# Patient Record
Sex: Female | Born: 1970 | Race: Black or African American | Hispanic: No | Marital: Single | Smoking: Never smoker
Health system: Southern US, Community
[De-identification: ages and names within clinical notes are randomized; demographics above are authoritative.]

## PROBLEM LIST (undated history)

## (undated) DIAGNOSIS — K219 Gastro-esophageal reflux disease without esophagitis: Secondary | ICD-10-CM

## (undated) DIAGNOSIS — Z5189 Encounter for other specified aftercare: Secondary | ICD-10-CM

## (undated) DIAGNOSIS — E042 Nontoxic multinodular goiter: Secondary | ICD-10-CM

## (undated) DIAGNOSIS — I10 Essential (primary) hypertension: Secondary | ICD-10-CM

## (undated) HISTORY — DX: Gastro-esophageal reflux disease without esophagitis: K21.9

## (undated) HISTORY — PX: ENDOMETRIAL ABLATION: SHX621

## (undated) HISTORY — DX: Encounter for other specified aftercare: Z51.89

## (undated) HISTORY — DX: Nontoxic multinodular goiter: E04.2

---

## 2000-11-11 ENCOUNTER — Other Ambulatory Visit: Admission: RE | Admit: 2000-11-11 | Discharge: 2000-11-11 | Payer: Self-pay | Admitting: Family Medicine

## 2001-08-24 ENCOUNTER — Other Ambulatory Visit: Admission: RE | Admit: 2001-08-24 | Discharge: 2001-08-24 | Payer: Self-pay | Admitting: Obstetrics and Gynecology

## 2001-08-26 ENCOUNTER — Ambulatory Visit (HOSPITAL_COMMUNITY): Admission: RE | Admit: 2001-08-26 | Discharge: 2001-08-26 | Payer: Self-pay | Admitting: Obstetrics and Gynecology

## 2004-05-29 ENCOUNTER — Encounter (INDEPENDENT_AMBULATORY_CARE_PROVIDER_SITE_OTHER): Payer: Self-pay | Admitting: Family Medicine

## 2004-06-18 ENCOUNTER — Other Ambulatory Visit: Admission: RE | Admit: 2004-06-18 | Discharge: 2004-06-18 | Payer: Self-pay | Admitting: Family Medicine

## 2004-10-16 ENCOUNTER — Encounter: Admission: RE | Admit: 2004-10-16 | Discharge: 2004-10-16 | Payer: Self-pay | Admitting: Family Medicine

## 2005-09-22 ENCOUNTER — Emergency Department (HOSPITAL_COMMUNITY): Admission: EM | Admit: 2005-09-22 | Discharge: 2005-09-22 | Payer: Self-pay | Admitting: *Deleted

## 2005-12-25 ENCOUNTER — Emergency Department (HOSPITAL_COMMUNITY): Admission: EM | Admit: 2005-12-25 | Discharge: 2005-12-25 | Payer: Self-pay | Admitting: Emergency Medicine

## 2005-12-30 ENCOUNTER — Ambulatory Visit: Payer: Self-pay | Admitting: *Deleted

## 2006-02-02 ENCOUNTER — Ambulatory Visit: Payer: Self-pay | Admitting: Internal Medicine

## 2006-07-01 ENCOUNTER — Emergency Department (HOSPITAL_COMMUNITY): Admission: EM | Admit: 2006-07-01 | Discharge: 2006-07-01 | Payer: Self-pay | Admitting: Emergency Medicine

## 2006-09-02 ENCOUNTER — Emergency Department (HOSPITAL_COMMUNITY): Admission: EM | Admit: 2006-09-02 | Discharge: 2006-09-02 | Payer: Self-pay | Admitting: Family Medicine

## 2007-01-13 ENCOUNTER — Encounter (INDEPENDENT_AMBULATORY_CARE_PROVIDER_SITE_OTHER): Payer: Self-pay | Admitting: *Deleted

## 2007-01-19 ENCOUNTER — Telehealth (INDEPENDENT_AMBULATORY_CARE_PROVIDER_SITE_OTHER): Payer: Self-pay | Admitting: *Deleted

## 2007-02-02 ENCOUNTER — Encounter (INDEPENDENT_AMBULATORY_CARE_PROVIDER_SITE_OTHER): Payer: Self-pay | Admitting: Family Medicine

## 2007-02-02 DIAGNOSIS — G44209 Tension-type headache, unspecified, not intractable: Secondary | ICD-10-CM

## 2007-02-02 DIAGNOSIS — I10 Essential (primary) hypertension: Secondary | ICD-10-CM | POA: Insufficient documentation

## 2007-03-12 ENCOUNTER — Encounter (INDEPENDENT_AMBULATORY_CARE_PROVIDER_SITE_OTHER): Payer: Self-pay | Admitting: *Deleted

## 2007-07-22 ENCOUNTER — Inpatient Hospital Stay (HOSPITAL_COMMUNITY): Admission: EM | Admit: 2007-07-22 | Discharge: 2007-07-26 | Payer: Self-pay | Admitting: Emergency Medicine

## 2007-12-24 ENCOUNTER — Encounter: Payer: Self-pay | Admitting: Emergency Medicine

## 2007-12-25 ENCOUNTER — Encounter (INDEPENDENT_AMBULATORY_CARE_PROVIDER_SITE_OTHER): Payer: Self-pay | Admitting: Obstetrics

## 2007-12-25 ENCOUNTER — Inpatient Hospital Stay (HOSPITAL_COMMUNITY): Admission: AD | Admit: 2007-12-25 | Discharge: 2007-12-26 | Payer: Self-pay | Admitting: Obstetrics

## 2007-12-26 ENCOUNTER — Encounter (INDEPENDENT_AMBULATORY_CARE_PROVIDER_SITE_OTHER): Payer: Self-pay | Admitting: Obstetrics

## 2008-07-26 ENCOUNTER — Encounter: Admission: RE | Admit: 2008-07-26 | Discharge: 2008-07-26 | Payer: Self-pay | Admitting: Family Medicine

## 2010-05-20 ENCOUNTER — Encounter: Payer: Self-pay | Admitting: Family Medicine

## 2010-07-09 ENCOUNTER — Inpatient Hospital Stay (HOSPITAL_COMMUNITY)
Admission: EM | Admit: 2010-07-09 | Discharge: 2010-07-11 | DRG: 305 | Disposition: A | Discharge status: Home / Self Care | Payer: Self-pay | Attending: Internal Medicine | Admitting: Internal Medicine

## 2010-07-09 ENCOUNTER — Emergency Department (HOSPITAL_COMMUNITY): Payer: Self-pay

## 2010-07-09 DIAGNOSIS — E876 Hypokalemia: Secondary | ICD-10-CM | POA: Diagnosis present

## 2010-07-09 DIAGNOSIS — I517 Cardiomegaly: Secondary | ICD-10-CM | POA: Diagnosis present

## 2010-07-09 DIAGNOSIS — R079 Chest pain, unspecified: Secondary | ICD-10-CM | POA: Diagnosis present

## 2010-07-09 DIAGNOSIS — Z91199 Patient's noncompliance with other medical treatment and regimen due to unspecified reason: Secondary | ICD-10-CM

## 2010-07-09 DIAGNOSIS — I519 Heart disease, unspecified: Secondary | ICD-10-CM | POA: Diagnosis present

## 2010-07-09 DIAGNOSIS — I10 Essential (primary) hypertension: Principal | ICD-10-CM | POA: Diagnosis present

## 2010-07-09 DIAGNOSIS — Z9119 Patient's noncompliance with other medical treatment and regimen: Secondary | ICD-10-CM

## 2010-07-09 LAB — CBC
HCT: 41.8 % (ref 36.0–46.0)
MCHC: 32.8 g/dL (ref 30.0–36.0)
Platelets: 202 10*3/uL (ref 150–400)
RBC: 4.49 MIL/uL (ref 3.87–5.11)
RDW: 13.5 % (ref 11.5–15.5)
WBC: 9.8 10*3/uL (ref 4.0–10.5)

## 2010-07-09 LAB — URINALYSIS, ROUTINE W REFLEX MICROSCOPIC
Bilirubin Urine: NEGATIVE
Glucose, UA: NEGATIVE mg/dL
Hgb urine dipstick: NEGATIVE
Ketones, ur: NEGATIVE mg/dL
Nitrite: NEGATIVE
Protein, ur: NEGATIVE mg/dL
Specific Gravity, Urine: 1.018 (ref 1.005–1.030)
Urobilinogen, UA: 0.2 mg/dL (ref 0.0–1.0)
pH: 7 (ref 5.0–8.0)

## 2010-07-09 LAB — POCT CARDIAC MARKERS
CKMB, poc: 1.4 ng/mL (ref 1.0–8.0)
Myoglobin, poc: 59.6 ng/mL (ref 12–200)

## 2010-07-09 LAB — POCT I-STAT, CHEM 8
Calcium, Ion: 1.16 mmol/L (ref 1.12–1.32)
Glucose, Bld: 96 mg/dL (ref 70–99)

## 2010-07-09 LAB — RAPID STREP SCREEN (MED CTR MEBANE ONLY): Streptococcus, Group A Screen (Direct): NEGATIVE

## 2010-07-09 LAB — DIFFERENTIAL
Basophils Absolute: 0 10*3/uL (ref 0.0–0.1)
Eosinophils Relative: 1 % (ref 0–5)
Lymphs Abs: 1.7 10*3/uL (ref 0.7–4.0)
Neutro Abs: 7.1 10*3/uL (ref 1.7–7.7)

## 2010-07-10 DIAGNOSIS — I10 Essential (primary) hypertension: Secondary | ICD-10-CM

## 2010-07-10 LAB — CARDIAC PANEL(CRET KIN+CKTOT+MB+TROPI)
CK, MB: 1 ng/mL (ref 0.3–4.0)
CK, MB: 1.1 ng/mL (ref 0.3–4.0)
CK, MB: 0.9 ng/mL (ref 0.3–4.0)
Relative Index: 0.5 (ref 0.0–2.5)
Relative Index: 0.6 (ref 0.0–2.5)
Total CK: 166 U/L (ref 7–177)
Troponin I: 0.01 ng/mL (ref 0.00–0.06)
Troponin I: 0.01 ng/mL (ref 0.00–0.06)

## 2010-07-10 LAB — COMPREHENSIVE METABOLIC PANEL
AST: 24 U/L (ref 0–37)
Creatinine, Ser: 0.81 mg/dL (ref 0.4–1.2)
Glucose, Bld: 114 mg/dL — ABNORMAL HIGH (ref 70–99)
Potassium: 3.2 mEq/L — ABNORMAL LOW (ref 3.5–5.1)
Sodium: 139 mEq/L (ref 135–145)
Total Bilirubin: 1.3 mg/dL — ABNORMAL HIGH (ref 0.3–1.2)

## 2010-07-10 LAB — CBC
HCT: 38.5 % (ref 36.0–46.0)
Hemoglobin: 12.5 g/dL (ref 12.0–15.0)
MCH: 30.3 pg (ref 26.0–34.0)
MCHC: 32.5 g/dL (ref 30.0–36.0)
MCV: 93.2 fL (ref 78.0–100.0)
RBC: 4.13 MIL/uL (ref 3.87–5.11)
RDW: 13.5 % (ref 11.5–15.5)

## 2010-07-10 NOTE — H&P (Signed)
[name], [name] NO.:  [id number]  MEDICAL RECORD NO.:  [id number]           PATIENT TYPE:  E  LOCATION:  WLED                         FACILITY:  [facility]  PHYSICIAN:  Vania Rea, M.D. DATE OF BIRTH:  [birth year]  DATE OF ADMISSION:  07/09/2010 DATE OF DISCHARGE:                             HISTORY & PHYSICAL   PRIMARY CARE PHYSICIAN:  Unassigned, previously was seeing Dr. Renaye Rakers.  CHIEF COMPLAINT:  Episodic chest pain and hoarseness.  HISTORY OF PRESENT ILLNESS:  This is a 40 year old African American lady with a long-standing history of hypertension for over twenty years, and a strong family history of hypertension including her 41 year old son, who presents to the emergency room complaining of on and off chest pain for a long period of time, but now has been having hoarseness and cough.  In the emergency room, the patient was evaluated and felt to be having a mild upper respiratory infection causing hoarseness and cough and probable chest pain, but was additionally found to be having a blood pressure of 244/154.  She admits that she has not used her blood pressure medications for the past 2 days, but also admits that when she has checked her blood pressure over the past weeks, she does tend to run systolics in the 190s to 200s.  She has not been seeing a physician regularly because she moved out of the area and recently moved back. She has been having no headache, no palpitations, no shortness of breath nor PND, no lower extremity edema, no diaphoresis nor dizziness, no nausea, no vomiting.  PAST MEDICAL HISTORY: 1. Hypertension. 2. Menorrhagia due to fibroids. 3. She is status post endometrial ablation. 4. She is status post tubal ligation.  MEDICATIONS: 1. Amlodipine 10 mg daily. 2. Lisinopril/HCTZ 20/25 one tablet daily. 3. No medications taken in the past 2 days.  ALLERGIES:  No known drug allergies.  SOCIAL HISTORY:  Denies  tobacco, alcohol, or illicit drug use.  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  She lives with her 4 children.  FAMILY HISTORY:  Significant for hypertension in her mother, brothers, and sisters.  She also has 2 sisters with diabetes and her son who is now 45 was diagnosed with hypertension at the age of 10.  REVIEW OF SYSTEMS:  Other than noted above, a complete review of systems is unremarkable.  PHYSICAL EXAMINATION:  GENERAL:  An obese middle-aged African American lady reclining in the stretcher, not currently in any acute distress. VITAL SIGNS:  Initially as noted above, after receiving 10 mg of beta- blocker intravenously, her blood pressure is 173/100.  She is saturating at 98% on room air, temperature is 98.4, her pulse is 86, respirations 18. HEENT:  Her pupils are round and equal.  Mucous membranes pink. Anicteric.  Her throat is normal.  There is no hyperemia.  There is no congestion. NECK:  No cervical lymphadenopathy or thyromegaly.  No carotid bruit. No jugular venous distention.CHEST:  Clear to auscultation bilaterally. CARDIOVASCULAR SYSTEM:  Regular rhythm.  No murmur heard. ABDOMEN:  Obese, soft, nontender.  No masses.  EXTREMITIES:  Muscle tone and bulk is good.  No edema.  Dorsalis pedis pulses 2+ bilaterally. CENTRAL NERVOUS SYSTEM:  Cranial nerves II through XII are grossly intact.  She has no focal lateralizing signs.  LABORATORY DATA:  Her white count is 9.8, hemoglobin 13.7, platelets 202.  She has a normal differential.  Her i-STAT chemistry shows a sodium of 139, potassium of 3.8, chloride of 100, glucose 96, BUN 11, creatinine 1.0.  Her cardiac markers are completely normal with a myoglobin of 59 and undetectable troponins.  Urinalysis is completely normal.  Rapid strep throat is negative.  Her EKG shows sinus tachycardia, possible left atrial enlargement, left ventricular hypertrophy as evidenced by R wave in  aVL being 16 mm.  Two- view chest x-ray shows normal-sized heart, clear lungs.  No infiltrate or effusion.  ASSESSMENT: 1. Hypertensive urgency. 2. Possible upper respiratory infection. 3. Obesity  PLAN: 1. We will bring this lady in on observation for management of the     hypertensive urgency.  We will continue her home medications, but     based on her description of the lack of control, we will increase     the lisinopril to 40 mg daily and add Coreg 6.25 mg twice daily.     We will add p.r.n. hydralazine while in hospital. 2. We will order a 2-D echo. 3. Other plans as per orders.     Vania Rea, M.D.     LC/MEDQ  D:  07/09/2010  T:  07/10/2010  Job:  [id number]  cc:   Renaye Rakers, M.D. Fax: [phone]  Electronically Signed by Vania Rea M.D. on 07/10/2010 03:49:15 AM

## 2010-07-11 LAB — BASIC METABOLIC PANEL
BUN: 9 mg/dL (ref 6–23)
Calcium: 9.2 mg/dL (ref 8.4–10.5)
Chloride: 102 mEq/L (ref 96–112)
Creatinine, Ser: 0.8 mg/dL (ref 0.4–1.2)
GFR calc Af Amer: >60 mL/min (ref >60)
GFR calc non Af Amer: >60 mL/min (ref >60)
Glucose, Bld: 98 mg/dL (ref 70–99)
Potassium: 3.9 mEq/L (ref 3.5–5.1)
Sodium: 135 mEq/L (ref 135–145)

## 2010-07-16 NOTE — Discharge Summary (Signed)
[name], [name]                ACCOUNT NO.:  [id number]  MEDICAL RECORD NO.:  [id number]           PATIENT TYPE:  I  LOCATION:  1414                         FACILITY:  [facility]  PHYSICIAN:  Hillery Aldo, M.D.   DATE OF BIRTH:  [birth year]  DATE OF ADMISSION:  07/09/2010 DATE OF DISCHARGE:  07/11/2010                              DISCHARGE SUMMARY   PRIMARY CARE PHYSICIAN:  Currently none.  The patient has seen [name] and has gone to [facility] in the past.  DISCHARGE DIAGNOSES: 1. Accelerated hypertension. 2. Medical nonadherence. 3. Hypokalemia. 4. Chest pain likely secondary to accelerated hypertension, status     post two-dimensional echocardiogram negative for regional wall     motion abnormalities. 5. Grade 1 diastolic dysfunction. 6. Left ventricular hypertrophy.  DISCHARGE MEDICATIONS: 1. Carvedilol 6.25 mg p.o. b.i.d. 2. Diltiazem 90 mg p.o. b.i.d. 3. Lisinopril/HCTZ 40/25 p.o. daily. 4. Aleve 220 mg three tablets p.o. b.i.d. p.r.n. pain. 5. Allergy Eye Relief one to three drops OU b.i.d. p.r.n. dry eye. 6. Aspirin 81 mg p.o. daily. 7. Extra Strength Tylenol 500 mg one to two tablets p.o. q.6 hours     p.r.n. pain.  CONSULTATIONS:  None.  BRIEF ADMISSION HISTORY OF PRESENT ILLNESS:  The patient is a 40 year old female with past medical history of hypertension.  She presented to the hospital with a chief complaint of episodic chest pain and feeling hoarse.  Upon initial evaluation in the Emergency Department, she was found to have a blood pressure of 244/154.  The patient reports that she had not taken her antihypertensive medication for at least 2 days but even when on her medicines, she reported systolics in the 190s to 200s. The patient was subsequently referred to the Hospitalist Service for further inpatient evaluation and treatment.  For the full details, please see the dictated report done by Dr. Orvan Falconer.  PROCEDURES AND DIAGNOSTIC STUDIES: 1.  Chest x-ray on Blair 13, 2012, was negative. 2. Two-dimensional echocardiogram done on Blair 14, 2012, showed LVH.     Systolic function was normal with an ejection fraction estimated at     50% to 55%.  There were no regional wall motion abnormalities.     There was grade 1 diastolic dysfunction.  DISCHARGE LABORATORY VALUES:  Sodium was 135, potassium 3.9, chloride 102, bicarbonate 28, BUN 9, creatinine 0.80, glucose 98, calcium 9.2. Cardiac markers were negative x3 sets.  TSH was 0.731.  Magnesium was 2 and liver function studies were within normal limits with the exception of mildly elevated bilirubin at 1.3 and a mildly low albumin at 3.4. Rapid streptococcus screening was negative.  HOSPITAL COURSE BY PROBLEM: 1. Accelerated hypertension:  The patient was admitted and put back on     her home medications.  Her lisinopril was titrated up from 20 to 40     mg and Coreg was added.  She was given p.r.n. hydralazine while in     the hospital and ultimately achieved good blood pressure control on     the four-drug regimen as outlined above.  We suspect that her lack  of control was secondary to medical nonadherence and non-followup.     At this point, would refer her back to either [facility] or to the     [facility] for ongoing hypertension management and for     consideration of a workup of secondary causes of hypertension.  The     patient has been fully educated about the risks of untreated     hypertension and verbalized understanding. 2. Hypokalemia:  The patient's potassium was fully repleted prior to     discharge. 3. Chest pain:  This was felt to be due to hypertensive urgency.  Her     cardiac markers were negative x3 sets and there was no evidence of     regional wall motion abnormality on two-dimensional     echocardiography. 4. Grade 1 diastolic dysfunction:  The patient achieved blood pressure     control prior to discharge and was encouraged to follow up with      [facility] or the [facility] for ongoing blood pressure     monitoring and adjustment of medications as needed.  All of her     medications were changed to generics that are available at [employer]     for 4 dollars a month secondary to cost concerns.  DISPOSITION:  The patient is medically stable and will be discharged home.  CONDITION ON DISCHARGE:  Improved.  DISCHARGE DIET:  Heart-healthy, low-sodium.  Time spent coordinating care for discharge and discharge instructions including face-to-face time equals 35 minutes.     Hillery Aldo, M.D.     CR/MEDQ  D:  07/11/2010  T:  07/11/2010  Job:  [id number]  cc:   [name], M.D. Fax: [phone]  [name], M.D. Fax: [phone]  Electronically Signed by Hillery Aldo M.D. on 07/16/2010 03:24:38 PM

## 2010-09-10 NOTE — Op Note (Signed)
NAMECALLE, SCHADER NO.:  [id number]   MEDICAL RECORD NO.:  [id number]          PATIENT TYPE:  INP   LOCATION:  9304                          FACILITY:  [facility]   PHYSICIAN:  Kathreen Cosier, M.D.DATE OF BIRTH:  1971-04-20   DATE OF PROCEDURE:  12/26/2007  DATE OF DISCHARGE:  12/26/2007                               OPERATIVE REPORT   PREOPERATIVE DIAGNOSES:  1. Dysfunctional uterine bleeding.  2. Myoma uteri.  3. Severe anemia requiring transfusion.   POSTOPERATIVE DIAGNOSES:  1. Dysfunctional uterine bleeding.  2. Myoma uteri.  3. Severe anemia requiring transfusion.   PROCEDURES:  1. Hysteroscopy.  2. Dilation and curettage.  3. NovaSure ablation.   SURGEON:  Kathreen Cosier, MD   Under general anesthesia, the patient in lithotomy position.  Perineum  and vagina prepped and draped.  Bladder emptied with straight catheter.  Bimanual exam revealed uterus to be 12-14 weeks' size.  Speculum placed  in the vagina.  Cervix was injected with 10 mL 1% Xylocaine.  Anterior  lip of the cervix grasped with the tenaculum.  Endocervix curetted,  small amount of tissue obtained.  The endometrial cavity sounded 11 cm.  The cervix then measured 6 cm, cavity 5 cm.  The cervix was noted to be  open and easily admitted a #27 Pratt.  The hysteroscope inserted and  revealed polyp.  The hysteroscope removed.  Sharp curettage performed.  Large amount of tissue obtained.  NovaSure ablation device was inserted  and the cavity integrity test passed.  NovaSure ablation was performed  for 2 minutes at 124 W.  Cavity width was 4.5.  After the ablation, the  NovaSure device removed and hysteroscopy revealed total ablation of the  cavity.  Fluid deficit 45 mL.  The patient tolerated the procedure well  and was taken to recovery room in good condition.           ______________________________  Kathreen Cosier, M.D.     BAM/MEDQ  D:  12/26/2007  T:  12/26/2007  Job:   [id number]

## 2010-09-10 NOTE — Discharge Summary (Signed)
[name], [name] NO.:  [id number]   MEDICAL RECORD NO.:  [id number]          PATIENT TYPE:  INP   LOCATION:  1520                         FACILITY:  [facility]   PHYSICIAN:  Lucita Ferrara, MD         DATE OF BIRTH:  May 24, 1970   DATE OF ADMISSION:  07/22/2007  DATE OF DISCHARGE:  07/24/2007                               DISCHARGE SUMMARY   DISCHARGE DIAGNOSES:  1. Menorrhagia.  2. Acute blood loss anemia secondary to #1.  3. Chest pain, dizziness and chest pressure.  4. History of hypertension, uncontrolled.  5. Hypokalemia, resolved.   PROCEDURES:  1. The patient had an abdominal ultrasound on July 23, 2007, which      showed negative abdominal ultrasound, normal gallbladder; no      hepatic, pancreatic, splenic or renal abnormalities.  2. The patient had a pelvic ultrasound which showed focal fibroids,      three areas of focality with altered echo texture compatible with      focal fibroids, number one a mural with a small subserosal      component in the fundal region measuring 3 x 2.7 x 4.1, in the      posterior upper uterine segment measuring 2 x 2.2 x 1.8, and in      mural location in the right lateral mid body measuring 1.8 x 2.4 x      1.8 cm mural location.  Yet, no fibroid with submucosal component,      normal presecretory endometrial stripe, normal ovaries.      Transvaginal ultrasound as above.  3. The patient also had a chest x-ray dated July 22, 2007, which      showed normal heart size and vascularity, lungs clear; negative for      pneumonia, edema, effusion or pneumothorax.  Midline trachea.   CONSULTATIONS:  Dr. Leda Quail with obstetrics and gynecology  services at [facility].   BRIEF HISTORY OF PRESENT ILLNESS:  [name] is a 40 year old African  American female who presents to [facility] with chief  complaint of heavy urine bleeding that has been ongoing for the last  several days.  She also felt  some chest pain and dizziness.  Given the  above findings hospital course below.   1. Menorrhagia.  The patient was evaluated by GYN, Dr. Hyacinth Meeker, who      recommended a uterine, abdominal and transvaginal ultrasound which      confirmed our suspicion of having fibroids which is basically the      culprit for her significant anemia and vaginal bleeding.  She was      transfused several units of packed red blood cells and her      hemoglobin stabilized.  Note that she did not have any hematemesis      or hematochezia.  She also did not have any rectal pain.  She also      did not have any focal abdominal pain.  Her dizziness, chest pain      also resolved upon institution of above  and transfusion of blood.      She was also started on ferrous sulfate given her microcytosis.  2. Hypertension.  It was noted that the patient actually has a known      diagnosis of uncontrolled hypertension.  At the time of admission      she had not been taking her blood pressure medications.  She denied      any visual changes.  Her blood pressure was difficult to stabilize      but upon institution of hydrochlorothiazide 25 mg daily her blood      pressure normalized.   Discharge medications will be as follows:  1. She will be on ferrous sulfate 325 mg p.o. b.i.d.  2. Colace 100 mg p.o. b.i.d.  3. She will be going home with hydrochlorothiazide 25 mg p.o. daily.  4. Tylenol Extra Strength 1000 mg every 4-6 hours as needed.   DISCHARGE CONDITION:  Stable.   DISCHARGE DIET:  She is to resume a regular diet.   ACTIVITY:  She is to resume normal activity.  She is to increase her  activity slowly, to call with any chest pain, dizziness or shortness of  breath.   FOLLOW-UP APPOINTMENTS:  She is to follow up with Dr. Mia Creek at the  [facility].  Appointment will be made at the time of  discharge.  We will also help her set up a primary care visit with  [facility] for her primary care needs,  hypertension control.  I  recommended the patient that if primary care doctor cannot control blood  pressure she will eventually need a referral to nephrology for possible  evaluation of secondary causes of hypertension.  Upon discharge she is  hemodynamically stable.  Vital signs are within normal limits.  Blood  pressure has been stabilized now to 130/80, temperature 97.8, pulse 69,  respirations 16, pulse oximetry is 98% on room air.      Lucita Ferrara, MD  Electronically Signed     RR/MEDQ  D:  07/24/2007  T:  07/24/2007  Job:  [id number]

## 2010-09-10 NOTE — H&P (Signed)
NAMEJOSSIE, SMOOT                ACCOUNT NO.:  [id number]   MEDICAL RECORD NO.:  [id number]          PATIENT TYPE:  INP   LOCATION:  0102                         FACILITY:  [facility]   PHYSICIAN:  Darryl D. Prime, MD    DATE OF BIRTH:  1970/10/11   DATE OF ADMISSION:  07/22/2007  DATE OF DISCHARGE:                              HISTORY & PHYSICAL   The patient is full code.   PRIMARY CARE PHYSICIAN:  Renaye Rakers, M.D., but she has not seen in  quite some time, per patient.   Patient was the historian.  She is a good historian.   CHIEF COMPLAINT:  Chest pain and drowsy.   HISTORY OF PRESENT ILLNESS:  Ms. Laham is a 40 year old female with a  history of anemia, possibly due to heavy uterine bleeding and  menorrhagia, who notes for the last two weeks, she has been  significantly drowsy and having constant substernal chest pain.  Substernal chest pain is mild, but radiating to the right side, sharp.  The patient notes her blood pressure has been high, as well, and she has  not been taking her medicine since December.  The patient denies taking  any NSAIDs of any kind.  The patient notes her last menstrual period was  around July 06, 2007, and she had particularly heavy bleeding, to where  she had to change her clothes at least once, described as about eight  pads with clots soaked per day for five days.  She usually soaks six  pads per day for around five days.  The patient, in the emergency room,  was found to have a hemoglobin of 6.9 and was typed and crossed for two  units of blood.  She was Hemoccult-negative in the ER.  She denies any  black or bloody stools.  She denies any dysuria or hematuria.  She  denies any fevers.   PAST MEDICAL/SURGICAL HISTORY:  Bilateral tubal ligation.  She has a  history of prior therapy of the cervix times two.  History of abnormal  uterine bleeding.  History of hypertension.   ALLERGIES:  No known drug allergies.   MEDICATIONS:  She is not  taking, but she is supposed to be on Tiazac 120  mg daily and hydrochlorothiazide, unsure of dose.   FAMILY HISTORY:  Positive for diabetes and hypertension in both the  mother and the father.  She has a son, age 56, with hypertension.   SOCIAL HISTORY:  No tobacco ever.  She does occasionally drink alcohol  and she works for a [employer].   REVIEW OF SYSTEMS:  Fourteen-point review of systems negative, unless  stated above.   PHYSICAL EXAM:  VITAL SIGNS:  Temperature is 98 with a blood pressure of  182/100, pulse of 96, respiratory rate of 14, sats are 100% on room air,  weight is 82 kg.  IN GENERAL:  She is a female that looks her stated age, in no acute  distress.  HEENT:  Normocephalic, atraumatic.  Pupils were equal, round and  reactive to light.  Extraocular movements intact.  The conjunctiva  is  pale, sclera is anicteric.  The oropharynx is dry with no posterior  pharyngeal lesions.  NECK:  Supple with no lymphadenopathy or thyromegaly.  CARDIOVASCULAR EXAM:  Regular rhythm and rate with no murmurs, rubs or  gallops.  Normal S1, S2, no S3 or S4.  LUNGS:  Clear to auscultation bilaterally with no wheezing.  ABDOMEN:  Soft, nontender, nondistended, with no signs of  hepatosplenomegaly.  EXTREMITIES:  Show no clubbing or cyanosis.  There is trace lower  extremity edema bilaterally.  NEUROLOGIC EXAM:  She is alert and oriented times four with cranial  nerves II through XII grossly intact.  Strength and sensation are  grossly intact.   Chest x-ray shows no acute cardiopulmonary disease.  No prior study.   EKG showed normal sinus rhythm with sinus arrhythmia at a rate of 88, PR  interval 174, QRS 96, QT correct at 450 with normal axis, normal  intervals with normal ST to T signs.  No prior EKG available.   Cardiac markers are 22, 37, point of care were unremarkable, as well as  21, 27, also unremarkable.  Sodium of 139 with a potassium of 3.3,  chloride 107,  bicarb 25, BUN 7, creatinine 0.67, glucose 95, otherwise  normal LFTs.  Hemoglobin was 6.9 with a hematocrit of 23.3, last  hemoglobin in April of 2003 was 10.7.  Platelets are 158.  HCG urine was  negative.  Her white blood cell count was 7.4.  PT was 14.1, PTT 31, INR  1.1.  urinalysis negative.   ASSESSMENT AND PLAN:  This is a patient with a history of menorrhagia,  who presents with low hemoglobin, possibly having continued menorrhagia.  The patient will be admitted to receive three units of packed red blood  cells and we will check a TSH for her hypertension.  We will give Lasix  after the blood to try to control the blood pressure and she will most  likely go home on antihypertensives.  For her hypokalemia, we will  replete and follow.  For her chest pain, likely related to her low  hemoglobin, we will get cardiac markers times three and EKG in the  morning.  Considering her being low risk, she will likely not need a  telemetry bed.  GI prophylaxis with Zantac; DVT prophylaxis with  pneumatic compression devices.      Darryl D. Prime, MD  Electronically Signed     DDP/MEDQ  D:  07/22/2007  T:  07/23/2007  Job:  [id number]

## 2010-09-13 NOTE — Op Note (Signed)
[facility]  Patient:    [name], [name] Visit Number: [id number] MRN: [id number]          Service Type: DSU Location: [facility] Attending Physician:  Jaymes Graff A Dictated by:   Pierre Bali Normand Sloop, M.D. Proc. Date: 08/26/01 Admit Date:  08/26/2001                             Operative Report  DATE OF BIRTH:                02-07-1971  PREOPERATIVE DIAGNOSES:       Multiparity, desires sterilization.  POSTOPERATIVE DIAGNOSES:      Multiparity, desires sterilization.  PROCEDURE:                    Laparoscopic bilateral tubal ligation.  SURGEON:                      Naima A. Normand Sloop, M.D.  ANESTHESIA:                   General endotracheal tube, ECC 0.25% Marcaine.  INTRAVENOUS FLUIDS:           1800 cc Crystalloid.  ESTIMATED BLOOD LOSS:         Minimal.  URINE OUTPUT:                 About 25 cc.  COMPLICATIONS:                None.  FINDINGS:                     Normal sized uterus.  Normal appearing uterus, tubes, and ovaries.  Anterior and posterior cul-de-sacs were within normal limits.  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  Patient had normal appendix, however.  PROCEDURE IN DETAIL:          Patient was taken to the operating room where she was placed in the dorsal lithotomy position, given general anesthesia, prepped and draped in the normal sterile fashion.  Attention was turned to the vagina where a bivalve speculum was placed into the vagina.  Anterior lip of the cervix was grasped with a single tooth tenaculum.  Acorn manipulator was placed into the uterus as a means to manipulate the uterus.  Attention was then turned to the patients abdomen where a horizontal 10 mm infraumbilical incision was then made with a knife after 5 cc of 0.25% Marcaine was injected into the  skin.  The Veress needle was placed into the intra-abdominal cavity at a 45 degree angle while tenting the abdominal wall.  Intra-abdominal placement was confirmed with fluid filled syringe, a decrease in CO2 pressure. The abdomen was insufflated with about 3 L CO2 gas.  The Veress needle was then removed.  A 10 mm trocar was placed without difficulty.  Intra-abdominal placement was confirmed with the laparoscope.  The findings noted above were seen.  A small 5 mm incision was made 2 cm above the symphysis pubis.  After 3 cc 0.25% Marcaine was injected a 5 mm trocar was placed under direct visualization.  Hemostasis was assured.  The patients fallopian tubes were identified and followed out to the fimbriated end.  The patients right  fallopian tube was identified, grasped with Kleppinger forceps, fulgurated x3 in the mid isthmic portion of the tube.  Hemostasis was noted.  The patients left fallopian tube was identified, followed out to the fimbriated end in a similar fashion.  The mid isthmic portion of the tube was grasped with Kleppinger and fulgurated x3.  Hemostasis was noted.  The 5 mm trocar was then removed without difficulty.  Hemostasis was noted.  The 10 mm trocar and the scope was removed under direct visualization.  The fascia was closed with 0 Vicryl in an interlocked stitch in the infraumbilical incision.  The skin was closed in a subcuticular fashion using 3-0 Vicryl.  Sponge, lap, and needle counts were correct x2.  The tenaculum and acorn were removed with cervix noted to be hemostatic from the tenaculum. Dictated by:   Pierre Bali. Normand Sloop, M.D. Attending Physician:  Michael Litter DD:  08/26/01 TD:  08/27/01 Job: [id number]

## 2010-09-13 NOTE — H&P (Signed)
[facility]  Patient:    [name], [name] Visit Number: [id number] MRN: [id number]          Service Type: DSU Location: [facility] Attending Physician:  Jaymes Graff A Dictated by:   Pierre Bali Normand Sloop, M.D. Admit Date:  08/26/2001                           History and Physical  DATE OF BIRTH:                08-26-1970  HISTORY OF PRESENT ILLNESS:   The patient is a 40 year old African-American female, G5, P4-0-1-4 who presented to my office for a preoperative consultation on August 24, 2001 for a tubal ligation. The patients had ______ IUD but she was unsatisfied with the side effects and because she has hypertension did not want to take birth control pills or use any combined contraceptive. The patient was also given the option of having Depo-Provera or barrier contraception but said she desired sterilization.  PAST MEDICAL HISTORY:         Significant for hypertension.  PAST SURGICAL HISTORY:        Significant for cryosurgery of the cervix x2.  MEDICATIONS:                  Procardia XL and hydrochlorothiazide.  ALLERGIES:                    The patient has no known drug allergies.  PAST GYN HISTORY:             The patient is monogamous with one partner. No history of sexually transmitted diseases. She does have a history of abnormal Pap smear and which she received cryotherapy. She did have a history of Trichomonas treated years ago.  SOCIAL HISTORY:               Unremarkable.  FAMILY HISTORY:               Significant for diabetes and hypertension but no GYN cancer.  PHYSICAL EXAMINATION:  VITAL SIGNS:                  The patients blood pressure is 120/80, her weight is 185 pounds.  HEART:                        Regular.  LUNGS:                        Clear.  ABDOMEN:                      Soft and nontender.  PELVIC:                       Vulvovaginal exam is within normal limits. Vagina is normal. Cervix is nontender. Uterus  is normal size, shape, and consistency. Adnexa has no masses and nontender.  IMPRESSION/PLAN:              The patient has multiparity and desires tubal sterilization. The risks of sterilization were explained to the patient that they are but not limited to a failure rate of about 1 in 250, bleeding, infection, and damage to internal organs such as bowel, bladder and ureters. The patient was given all other contraceptive options. The patient did have Pap smear significant  for a low grade squamous intraepithelial lesion; however, her biopsy was found to be negative so she is to be followed by Pap smears every six months. Dictated by:   Pierre Bali. Normand Sloop, M.D. Attending Physician:  Michael Litter DD:  08/26/01 TD:  08/26/01 Job: [id number]

## 2010-11-11 ENCOUNTER — Other Ambulatory Visit (HOSPITAL_COMMUNITY): Payer: Self-pay | Admitting: Family Medicine

## 2010-11-11 DIAGNOSIS — R1011 Right upper quadrant pain: Secondary | ICD-10-CM

## 2010-11-13 ENCOUNTER — Ambulatory Visit (HOSPITAL_COMMUNITY): Payer: Self-pay

## 2010-11-20 ENCOUNTER — Ambulatory Visit (HOSPITAL_COMMUNITY)
Admission: RE | Admit: 2010-11-20 | Discharge: 2010-11-20 | Disposition: A | Discharge status: Home / Self Care | Payer: Self-pay | Source: Ambulatory Visit | Attending: Family Medicine | Admitting: Family Medicine

## 2010-11-20 ENCOUNTER — Ambulatory Visit (HOSPITAL_COMMUNITY): Payer: Self-pay

## 2010-11-20 DIAGNOSIS — R1011 Right upper quadrant pain: Secondary | ICD-10-CM | POA: Insufficient documentation

## 2010-11-20 DIAGNOSIS — R7989 Other specified abnormal findings of blood chemistry: Secondary | ICD-10-CM | POA: Insufficient documentation

## 2011-01-20 LAB — CBC
HCT: 23.3 — ABNORMAL LOW
HCT: 33.5 — ABNORMAL LOW
Hemoglobin: 10.9 — ABNORMAL LOW
Hemoglobin: 11.4 — ABNORMAL LOW
Hemoglobin: 6.9 — CL
MCHC: 29.6 — ABNORMAL LOW
MCHC: 31.7
MCHC: 31.9
MCHC: 32.5
MCV: 59.5 — ABNORMAL LOW
MCV: 66.4 — ABNORMAL LOW
Platelets: 158
Platelets: 92 — ABNORMAL LOW
RBC: 5.4 — ABNORMAL HIGH
RDW: 23 — ABNORMAL HIGH
RDW: 26.2 — ABNORMAL HIGH
RDW: 28 — ABNORMAL HIGH
WBC: 10.7 — ABNORMAL HIGH

## 2011-01-20 LAB — COMPREHENSIVE METABOLIC PANEL
ALT: 15
ALT: 17
AST: 17
Albumin: 3.3 — ABNORMAL LOW
Alkaline Phosphatase: 48
Alkaline Phosphatase: 56
Alkaline Phosphatase: 63
BUN: 4 — ABNORMAL LOW
BUN: 7
CO2: 29
Calcium: 9.2
Calcium: 9.2
Calcium: 9.2
Calcium: 9.4
Chloride: 102
Creatinine, Ser: 0.67
Creatinine, Ser: 0.79
GFR calc Af Amer: >60
GFR calc non Af Amer: >60
Glucose, Bld: 116 — ABNORMAL HIGH
Glucose, Bld: 86
Glucose, Bld: 95
Potassium: 3.3 — ABNORMAL LOW
Potassium: 4.3
Sodium: 136
Sodium: 138
Sodium: 141
Total Bilirubin: 1.6 — ABNORMAL HIGH
Total Protein: 6.3
Total Protein: 7.1
Total Protein: 7.2

## 2011-01-20 LAB — URINALYSIS, ROUTINE W REFLEX MICROSCOPIC
Bilirubin Urine: NEGATIVE
Glucose, UA: NEGATIVE
Hgb urine dipstick: NEGATIVE
Ketones, ur: NEGATIVE
Protein, ur: NEGATIVE
pH: 6.5

## 2011-01-20 LAB — CULTURE, BLOOD (ROUTINE X 2)

## 2011-01-20 LAB — POCT CARDIAC MARKERS
CKMB, poc: <1 — ABNORMAL LOW
Myoglobin, poc: 16.6
Operator id: 1192
Troponin i, poc: <0.05

## 2011-01-20 LAB — DIFFERENTIAL
Eosinophils Relative: 1
Lymphs Abs: 1.9
Monocytes Relative: 10
Neutro Abs: 4.6

## 2011-01-20 LAB — HEMOGLOBIN A1C
Hgb A1c MFr Bld: 5.8
Mean Plasma Glucose: 129

## 2011-01-20 LAB — APTT: aPTT: 31

## 2011-01-20 LAB — CARDIAC PANEL(CRET KIN+CKTOT+MB+TROPI)
CK, MB: 0.7
CK, MB: 0.7
Relative Index: 0.6

## 2011-01-20 LAB — CROSSMATCH

## 2011-01-20 LAB — TSH: TSH: 1.184

## 2011-01-20 LAB — FOLLICLE STIMULATING HORMONE: FSH: 3.7

## 2011-01-20 LAB — PHOSPHORUS
Phosphorus: 3.8
Phosphorus: 4.6

## 2011-01-20 LAB — MAGNESIUM: Magnesium: 2

## 2011-03-14 ENCOUNTER — Other Ambulatory Visit: Payer: Self-pay | Admitting: Family Medicine

## 2011-03-14 DIAGNOSIS — Z1231 Encounter for screening mammogram for malignant neoplasm of breast: Secondary | ICD-10-CM

## 2011-07-24 ENCOUNTER — Other Ambulatory Visit: Payer: Self-pay | Admitting: Internal Medicine

## 2011-11-05 ENCOUNTER — Encounter (HOSPITAL_COMMUNITY): Payer: Self-pay | Admitting: *Deleted

## 2011-11-05 ENCOUNTER — Emergency Department (HOSPITAL_COMMUNITY): Payer: Managed Care, Other (non HMO)

## 2011-11-05 ENCOUNTER — Emergency Department (HOSPITAL_COMMUNITY)
Admission: EM | Admit: 2011-11-05 | Discharge: 2011-11-06 | Disposition: A | Discharge status: Home / Self Care | Payer: Managed Care, Other (non HMO) | Attending: Emergency Medicine | Admitting: Emergency Medicine

## 2011-11-05 DIAGNOSIS — E876 Hypokalemia: Secondary | ICD-10-CM | POA: Insufficient documentation

## 2011-11-05 DIAGNOSIS — I10 Essential (primary) hypertension: Secondary | ICD-10-CM | POA: Insufficient documentation

## 2011-11-05 DIAGNOSIS — K219 Gastro-esophageal reflux disease without esophagitis: Secondary | ICD-10-CM | POA: Insufficient documentation

## 2011-11-05 HISTORY — DX: Essential (primary) hypertension: I10

## 2011-11-05 LAB — COMPREHENSIVE METABOLIC PANEL
ALT: 26 U/L (ref 0–35)
AST: 25 U/L (ref 0–37)
Albumin: 4.2 g/dL (ref 3.5–5.2)
CO2: 25 mEq/L (ref 19–32)
Chloride: 97 mEq/L (ref 96–112)
Creatinine, Ser: 0.67 mg/dL (ref 0.50–1.10)
GFR calc non Af Amer: >90 mL/min (ref >90)
Potassium: 2.9 mEq/L — ABNORMAL LOW (ref 3.5–5.1)
Sodium: 134 mEq/L — ABNORMAL LOW (ref 135–145)
Total Bilirubin: 1.2 mg/dL (ref 0.3–1.2)

## 2011-11-05 LAB — CBC WITH DIFFERENTIAL/PLATELET
Basophils Absolute: 0 10*3/uL (ref 0.0–0.1)
Basophils Relative: 0 % (ref 0–1)
HCT: 38.9 % (ref 36.0–46.0)
Lymphocytes Relative: 23 % (ref 12–46)
MCHC: 34.4 g/dL (ref 30.0–36.0)
Monocytes Absolute: 0.5 10*3/uL (ref 0.1–1.0)
Neutro Abs: 5.7 10*3/uL (ref 1.7–7.7)
Neutrophils Relative %: 70 % (ref 43–77)
Platelets: 213 10*3/uL (ref 150–400)
RDW: 13.3 % (ref 11.5–15.5)
WBC: 8.2 10*3/uL (ref 4.0–10.5)

## 2011-11-05 LAB — POCT I-STAT TROPONIN I

## 2011-11-05 MED ORDER — GI COCKTAIL ~~LOC~~
30.0000 mL | Freq: Once | ORAL | Status: AC
  Administered 2011-11-05: 30 mL via ORAL
  Filled 2011-11-05: qty 30

## 2011-11-05 MED ORDER — FAMOTIDINE 20 MG PO TABS
20.0000 mg | ORAL_TABLET | Freq: Once | ORAL | Status: AC
  Administered 2011-11-05: 20 mg via ORAL
  Filled 2011-11-05: qty 1

## 2011-11-05 MED ORDER — POTASSIUM CHLORIDE CRYS ER 20 MEQ PO TBCR
40.0000 meq | EXTENDED_RELEASE_TABLET | Freq: Once | ORAL | Status: AC
  Administered 2011-11-05: 40 meq via ORAL
  Filled 2011-11-05: qty 2

## 2011-11-05 MED ORDER — OMEPRAZOLE 20 MG PO CPDR: DELAYED_RELEASE_CAPSULE | ORAL | Status: DC

## 2011-11-05 MED ORDER — LISINOPRIL-HYDROCHLOROTHIAZIDE 20-12.5 MG PO TABS: 1.0000 | ORAL_TABLET | Freq: Every day | ORAL | Status: DC

## 2011-11-05 MED ORDER — LABETALOL HCL 200 MG PO TABS
200.0000 mg | ORAL_TABLET | Freq: Once | ORAL | Status: AC
  Administered 2011-11-05: 200 mg via ORAL
  Filled 2011-11-05: qty 1

## 2011-11-05 MED ORDER — POTASSIUM CHLORIDE ER 10 MEQ PO TBCR: 20.0000 meq | EXTENDED_RELEASE_TABLET | Freq: Two times a day (BID) | ORAL | Status: DC

## 2011-11-05 MED ORDER — AMLODIPINE BESYLATE 10 MG PO TABS: 10.0000 mg | ORAL_TABLET | Freq: Every day | ORAL | Status: DC

## 2011-11-05 NOTE — ED Provider Notes (Signed)
History     CSN: [id number]  Arrival date & time 11/05/11  Barry Brunner   First MD Initiated Contact with Patient 11/05/11 2136      Chief Complaint  Patient presents with  . Chest Pain    (Consider location/radiation/quality/duration/timing/severity/associated sxs/prior treatment) HPI  Patient states she ran out of her Norvasc about 2 weeks ago and ran out of her lisinopril/HCTZ about a month ago. She is only taking her Lopressor 50 mg once a day instead of twice a day as written. She states yesterday she started having some pain in her left chest underneath her breast and states it lasted a few minutes and happens about 3 times. She also had a pain around her left shoulder once. She states today she has some discomfort in her central chest and it feels like she needs to burp and it's a pressing pain. She denies getting acid reflux up in her throat. She denies nausea or vomiting or shortness of breath but states she's breathing hard. She denies cough or fever. She denies having swelling or pain in her legs. She does state she feels dizzy. She states she has headaches but she has chronic headaches.  PCP Dr. Dorris Fetch at [facility]  Past Medical History  Diagnosis Date  . Hypertension     History reviewed. No pertinent past surgical history.  History reviewed. No pertinent family history. Significant for strokes, hypertension, diabetes, but no coronary artery disease  History  Substance Use Topics  . Smoking status: No  . Smokeless tobacco: Not on file  . Alcohol Use:  occasional    employed  OB History    Grav Para Term Preterm Abortions TAB SAB Ect Mult Living                  Review of Systems  All other systems reviewed and are negative.    Allergies  Review of patient's allergies indicates no known allergies.  Home Medications   Current Outpatient Rx  Name Route Sig Dispense Refill  .  lisinopril-HYDROCHLOROTHIAZIDE 20-12.5 MG PO TABS Oral Take 1 tablet by mouth daily.     Marland Kitchen METOPROLOL TARTRATE 50 MG PO TABS Oral Take 50 mg by mouth 2 (two) times daily.(only takes 1 pill daily)     Norvasc 10 mg daily  BP 227/122  Pulse 106  Resp 20  SpO2 100%  LMP 10/11/2011  Vital signs normal except tachycardia and hypertension   Physical Exam  Constitutional: She is oriented to person, place, and time. She appears well-developed and well-nourished.  Non-toxic appearance. She does not appear ill. No distress.  HENT:  Head: Normocephalic and atraumatic.  Right Ear: External ear normal.  Left Ear: External ear normal.  Nose: Nose normal. No mucosal edema or rhinorrhea.  Mouth/Throat: Oropharynx is clear and moist and mucous membranes are normal. No dental abscesses or uvula swelling.  Eyes: Conjunctivae and EOM are normal. Pupils are equal, round, and reactive to light.  Neck: Normal range of motion and full passive range of motion without pain. Neck supple.  Cardiovascular: Normal rate, regular rhythm and normal heart sounds.  Exam reveals no gallop and no friction rub.   No murmur heard. Pulmonary/Chest: Effort normal and breath sounds normal. No respiratory distress. She has no wheezes. She has no rhonchi. She has no rales. She exhibits no tenderness and no crepitus.  Abdominal: Soft. Normal appearance and bowel sounds are normal. She exhibits no distension. There is no tenderness. There is no rebound and  no guarding.  Musculoskeletal: Normal range of motion. She exhibits no edema and no tenderness.       Moves all extremities well.   Neurological: She is alert and oriented to person, place, and time. She has normal strength. No cranial nerve deficit.  Skin: Skin is warm, dry and intact. No rash noted. No erythema. No pallor.  Psychiatric: She has a normal mood and affect. Her speech is normal and behavior is normal. Her mood appears not anxious.    ED Course  Procedures (including critical care time)   Medications  potassium chloride SA (K-DUR,KLOR-CON)  CR tablet 40 mEq (40 mEq Oral Given 11/05/11 2238)  gi cocktail (Maalox,Lidocaine,Donnatal) (30 mL Oral Given 11/05/11 2255)  famotidine (PEPCID) tablet 20 mg (20 mg Oral Given 11/05/11 2238)  labetalol (NORMODYNE) tablet 200 mg (200 mg Oral Given 11/05/11 2254)   BP improved to 184/117. Pt has been off her medications for at least a month, rapid lowering of BP is not desirable.    Results for orders placed during the hospital encounter of 11/05/11  CBC WITH DIFFERENTIAL      Component Value Range   WBC 8.2  4.0 - 10.5 K/uL   RBC 4.38  3.87 - 5.11 MIL/uL   Hemoglobin 13.4  12.0 - 15.0 g/dL   HCT 78.2  95.6 - 21.3 %   MCV 88.8  78.0 - 100.0 fL   MCH 30.6  26.0 - 34.0 pg   MCHC 34.4  30.0 - 36.0 g/dL   RDW 08.6  57.8 - 46.9 %   Platelets 213  150 - 400 K/uL   Neutrophils Relative 70  43 - 77 %   Neutro Abs 5.7  1.7 - 7.7 K/uL   Lymphocytes Relative 23  12 - 46 %   Lymphs Abs 1.9  0.7 - 4.0 K/uL   Monocytes Relative 6  3 - 12 %   Monocytes Absolute 0.5  0.1 - 1.0 K/uL   Eosinophils Relative 1  0 - 5 %   Eosinophils Absolute 0.1  0.0 - 0.7 K/uL   Basophils Relative 0  0 - 1 %   Basophils Absolute 0.0  0.0 - 0.1 K/uL  COMPREHENSIVE METABOLIC PANEL      Component Value Range   Sodium 134 (*) 135 - 145 mEq/L   Potassium 2.9 (*) 3.5 - 5.1 mEq/L   Chloride 97  96 - 112 mEq/L   CO2 25  19 - 32 mEq/L   Glucose, Bld 162 (*) 70 - 99 mg/dL   BUN 8  6 - 23 mg/dL   Creatinine, Ser 6.29  0.50 - 1.10 mg/dL   Calcium 9.7  8.4 - 52.8 mg/dL   Total Protein 7.9  6.0 - 8.3 g/dL   Albumin 4.2  3.5 - 5.2 g/dL   AST 25  0 - 37 U/L   ALT 26  0 - 35 U/L   Alkaline Phosphatase 66  39 - 117 U/L   Total Bilirubin 1.2  0.3 - 1.2 mg/dL   GFR calc non Af Amer >90  >90 mL/min   GFR calc Af Amer >90  >90 mL/min  POCT I-STAT TROPONIN I      Component Value Range   Troponin i, poc 0.01  0.00 - 0.08 ng/mL   Comment 3           D-DIMER, QUANTITATIVE      Component Value Range   D-Dimer, Quant 0.22  0.00 -  0.48 ug/mL-FEU  Laboratory interpretation all normal except hypokalemia   Dg Chest 2 View  11/05/2011  *RADIOLOGY REPORT*  Clinical Data: Left chest pain  CHEST - 2 VIEW  Comparison: 07/09/2010  Findings: Artifact overlies chest.  Heart size is normal. Mediastinal shadows are normal.  The lungs are clear.  No effusions.  No bony abnormalities.  IMPRESSION: Normal chest  Original Report Authenticated By: Thomasenia Sales, M.D.     Date: 11/05/2011  Rate: 96  Rhythm: normal sinus rhythm  QRS Axis: normal  Intervals: normal  ST/T Wave abnormalities: normal  Conduction Disutrbances:LVH  Narrative Interpretation:   Old EKG Reviewed: changes noted from 07/09/2010 HR was 107       1. Hypokalemia   2. Hypertension   3. GERD (gastroesophageal reflux disease)     New Prescriptions   AMLODIPINE (NORVASC) 10 MG TABLET    Take 1 tablet (10 mg total) by mouth daily.   LISINOPRIL-HYDROCHLOROTHIAZIDE (ZESTORETIC) 20-12.5 MG PER TABLET    Take 1 tablet by mouth daily.   OMEPRAZOLE (PRILOSEC) 20 MG CAPSULE    Take 1 po BID x 2 weeks then once a day   POTASSIUM CHLORIDE (K-DUR) 10 MEQ TABLET    Take 2 tablets (20 mEq total) by mouth 2 (two) times daily.    Plan discharge  Devoria Albe, MD, FACEP   MDM          Ward Givens, MD 11/07/11 1500

## 2011-11-05 NOTE — ED Notes (Signed)
Pt presents with a c/c of chest pain that radiates to her L shoulder and through to her back.  Pt st's pain started yesterday while she was sitting.  Nothing seems to make it better or worse, st's pain is sharp in nature and rates it 7/10.  Anginal equivalents include SOB and lightheadedness and some dizziness.  Pt is NSR on monitor and is very hypertensive at 204/122; pt admits to not following her med regimen like she's supposed to.

## 2011-11-05 NOTE — ED Notes (Signed)
Pt in c/o chest pain and shortness of breath since yesterday, states the pain is under her left breast, denies other symptoms

## 2012-03-17 ENCOUNTER — Ambulatory Visit (INDEPENDENT_AMBULATORY_CARE_PROVIDER_SITE_OTHER): Payer: Managed Care, Other (non HMO) | Admitting: Physician Assistant

## 2012-03-17 VITALS — BP 236/136 | HR 87 | Temp 98.7°F | Resp 18 | Ht 62.75 in | Wt 184.4 lb

## 2012-03-17 DIAGNOSIS — D259 Leiomyoma of uterus, unspecified: Secondary | ICD-10-CM | POA: Insufficient documentation

## 2012-03-17 DIAGNOSIS — I10 Essential (primary) hypertension: Secondary | ICD-10-CM

## 2012-03-17 MED ORDER — AMLODIPINE BESYLATE 10 MG PO TABS: 10.0000 mg | ORAL_TABLET | Freq: Every day | ORAL | Status: DC

## 2012-03-17 MED ORDER — LISINOPRIL-HYDROCHLOROTHIAZIDE 20-12.5 MG PO TABS: 1.0000 | ORAL_TABLET | Freq: Every day | ORAL | Status: DC

## 2012-03-17 MED ORDER — METOPROLOL TARTRATE 50 MG PO TABS: 50.0000 mg | ORAL_TABLET | Freq: Two times a day (BID) | ORAL | Status: DC

## 2012-03-17 NOTE — Progress Notes (Signed)
   [address] [place] [address]   Phone [phone]  Subjective:    Patient ID: [name], female    DOB: [birth year], 41 y.o.   MRN: [id number]  HPI  Pt presents to clinic for CPE and HTN. She has been out of her medications for about 2 wks.  She has very difficult BP to control.  She is not good about taking her medication.  She has been on meds for years and the only time she gets good control is when she is in the hospital on IV meds.  She has had trouble getting meds controlled because she sees a different provider every time who keeps changing her medication.  Review of Systems  Constitutional: Negative for fever and chills.  Eyes: Negative for visual disturbance.  Respiratory: Negative for chest tightness.   Cardiovascular: Negative for chest pain, palpitations and leg swelling.  Neurological: Negative for headaches.       Objective:   Physical Exam  Vitals reviewed. Constitutional: She is oriented to person, place, and time. She appears well-developed and well-nourished.  HENT:  Head: Normocephalic and atraumatic.  Right Ear: External ear normal.  Left Ear: External ear normal.  Nose: Nose normal.  Eyes: EOM and lids are normal. Pupils are equal, round, and reactive to light. Right conjunctiva is not injected. Left conjunctiva is not injected.  Fundoscopic exam:      The right eye shows no AV nicking and no hemorrhage.       The left eye shows no AV nicking and no hemorrhage.  Cardiovascular: Normal rate, regular rhythm and normal heart sounds.   No murmur heard. Pulmonary/Chest: Effort normal and breath sounds normal.  Neurological: She is alert and oriented to person, place, and time.  Skin: Skin is warm and dry.  Psychiatric: She has a normal mood and affect. Her behavior is normal. Judgment and thought content normal.   Reviewed EKG from July 2013.    Assessment and Plan:   1. HTN (hypertension)  Comprehensive metabolic panel, metoprolol (LOPRESSOR) 50  MG tablet, lisinopril-hydrochlorothiazide (ZESTORETIC) 20-12.5 MG per tablet, amLODipine (NORVASC) 10 MG tablet, DISCONTINUED: lisinopril-hydrochlorothiazide (ZESTORETIC) 20-12.5 MG per tablet, DISCONTINUED: amLODipine (NORVASC) 10 MG tablet    Needs eye exam - pt will schedule. I deferred her CPE at this time due to her uncontrolled BP.  I d/w pt that it will take several visits to get her controlled BP and then once it is controlled we can see her every 3 months.  I d/w pt that she needs to take her meds daily if we want any chance to control her medications.  Pt will plan on seeing me as PCP, she will see me in 2-3 wks for CPE and BP check.

## 2012-03-18 LAB — COMPREHENSIVE METABOLIC PANEL
ALT: 14 U/L (ref 0–35)
AST: 18 U/L (ref 0–37)
Albumin: 4.5 g/dL (ref 3.5–5.2)
BUN: 10 mg/dL (ref 6–23)
CO2: 25 mEq/L (ref 19–32)
Calcium: 9.7 mg/dL (ref 8.4–10.5)
Chloride: 101 mEq/L (ref 96–112)
Potassium: 3.3 mEq/L — ABNORMAL LOW (ref 3.5–5.3)

## 2012-03-19 ENCOUNTER — Other Ambulatory Visit: Payer: Self-pay | Admitting: Physician Assistant

## 2012-03-19 MED ORDER — POTASSIUM CHLORIDE ER 10 MEQ PO TBCR: 10.0000 meq | EXTENDED_RELEASE_TABLET | Freq: Every day | ORAL | Status: DC

## 2012-04-26 ENCOUNTER — Other Ambulatory Visit: Payer: Self-pay | Admitting: Physician Assistant

## 2012-04-27 ENCOUNTER — Other Ambulatory Visit: Payer: Self-pay | Admitting: Physician Assistant

## 2012-04-27 NOTE — Telephone Encounter (Signed)
Per last note, needed OV for CPE and BP check in mid-December 2013

## 2012-05-14 ENCOUNTER — Encounter (HOSPITAL_COMMUNITY): Payer: Self-pay | Admitting: Emergency Medicine

## 2012-05-14 ENCOUNTER — Emergency Department (HOSPITAL_COMMUNITY)
Admission: EM | Admit: 2012-05-14 | Discharge: 2012-05-15 | Disposition: A | Discharge status: Home / Self Care | Payer: Managed Care, Other (non HMO) | Attending: Emergency Medicine | Admitting: Emergency Medicine

## 2012-05-14 DIAGNOSIS — R221 Localized swelling, mass and lump, neck: Secondary | ICD-10-CM | POA: Insufficient documentation

## 2012-05-14 DIAGNOSIS — K089 Disorder of teeth and supporting structures, unspecified: Secondary | ICD-10-CM | POA: Insufficient documentation

## 2012-05-14 DIAGNOSIS — Z79899 Other long term (current) drug therapy: Secondary | ICD-10-CM | POA: Insufficient documentation

## 2012-05-14 DIAGNOSIS — I10 Essential (primary) hypertension: Secondary | ICD-10-CM | POA: Insufficient documentation

## 2012-05-14 DIAGNOSIS — R220 Localized swelling, mass and lump, head: Secondary | ICD-10-CM | POA: Insufficient documentation

## 2012-05-14 DIAGNOSIS — K0889 Other specified disorders of teeth and supporting structures: Secondary | ICD-10-CM

## 2012-05-14 NOTE — ED Notes (Signed)
Pt alert, arrives from home, c/o dental pain and swelling, onset was several days ago, denies dental f/u, resp even unlabored, skin pwd

## 2012-05-15 MED ORDER — PENICILLIN V POTASSIUM 500 MG PO TABS: 500.0000 mg | ORAL_TABLET | Freq: Four times a day (QID) | ORAL | Status: DC

## 2012-05-15 MED ORDER — NAPROXEN 500 MG PO TABS: 500.0000 mg | ORAL_TABLET | Freq: Two times a day (BID) | ORAL | Status: DC

## 2012-05-16 NOTE — ED Provider Notes (Signed)
History     CSN: [id number]  Arrival date & time 05/14/12  2102   First MD Initiated Contact with Patient 05/14/12 2343      Chief Complaint  Patient presents with  . Dental Pain    (Consider location/radiation/quality/duration/timing/severity/associated sxs/prior treatment) HPI  Past Medical History  Diagnosis Date  . Hypertension     History reviewed. No pertinent past surgical history.  Family History  Problem Relation Age of Onset  . Hypertension Mother   . Hypertension Father   . Diabetes Sister   . Hypertension Sister   . Hypertension Brother   . Hypertension Son   . Cancer Paternal Aunt   . Diabetes Maternal Grandmother   . Hypertension Maternal Grandmother   . Diabetes Paternal Grandmother   . Hypertension Paternal Grandmother   . Diabetes Sister     History  Substance Use Topics  . Smoking status: Never Smoker   . Smokeless tobacco: Not on file  . Alcohol Use: 6.0 oz/week    5 Cans of beer, 5 Shots of liquor per week    OB History    Grav Para Term Preterm Abortions TAB SAB Ect Mult Living                  Review of Systems  Allergies  Review of patient's allergies indicates no known allergies.  Home Medications   Current Outpatient Rx  Name  Route  Sig  Dispense  Refill  . AMLODIPINE BESYLATE 10 MG PO TABS   Oral   Take 10 mg by mouth daily. Needs office visit         . LISINOPRIL-HYDROCHLOROTHIAZIDE 20-12.5 MG PO TABS   Oral   Take 2 tablets by mouth daily.         Marland Kitchen METOPROLOL TARTRATE 50 MG PO TABS   Oral   Take 50 mg by mouth 2 (two) times daily. NEED OFFICE VISIT         . POTASSIUM CHLORIDE CRYS ER 10 MEQ PO TBCR   Oral   Take 10 mEq by mouth daily. Needs office visit         . NAPROXEN 500 MG PO TABS   Oral   Take 1 tablet (500 mg total) by mouth 2 (two) times daily with a meal.   30 tablet   0   . PENICILLIN V POTASSIUM 500 MG PO TABS   Oral   Take 1 tablet (500 mg total) by mouth 4 (four) times daily.  40 tablet   0     BP 175/93  Pulse 78  Temp 98 F (36.7 C) (Oral)  Resp 16  SpO2 100%  LMP 05/09/2012  Physical Exam  ED Course  Procedures (including critical care time)  Labs Reviewed - No data to display No results found.   1. Pain, dental       MDM  Chart needs to go to physicians assistant        Donnetta Hutching, MD 05/19/12 2114

## 2012-05-17 NOTE — ED Provider Notes (Signed)
History     CSN: [id number]  Arrival date & time 05/14/12  2102   First MD Initiated Contact with Patient 05/14/12 2343      Chief Complaint  Patient presents with  . Dental Pain    (Consider location/radiation/quality/duration/timing/severity/associated sxs/prior treatment) Patient is a 42 y.o. female presenting with tooth pain. The history is provided by the patient.  Dental PainThe primary symptoms include dental injury. Primary symptoms do not include oral bleeding, oral lesions, headaches, fever, shortness of breath, sore throat, angioedema or cough. The symptoms began more than 1 month ago. The symptoms are worsening. The symptoms are chronic. The symptoms occur constantly.  Additional symptoms include: gum swelling and facial swelling. Additional symptoms do not include: gum tenderness, purulent gums, jaw pain, trouble swallowing, pain with swallowing, excessive salivation, dry mouth, taste disturbance, drooling, ear pain, hearing loss, nosebleeds and swollen glands.    Past Medical History  Diagnosis Date  . Hypertension     History reviewed. No pertinent past surgical history.  Family History  Problem Relation Age of Onset  . Hypertension Mother   . Hypertension Father   . Diabetes Sister   . Hypertension Sister   . Hypertension Brother   . Hypertension Son   . Cancer Paternal Aunt   . Diabetes Maternal Grandmother   . Hypertension Maternal Grandmother   . Diabetes Paternal Grandmother   . Hypertension Paternal Grandmother   . Diabetes Sister     History  Substance Use Topics  . Smoking status: Never Smoker   . Smokeless tobacco: Not on file  . Alcohol Use: 6.0 oz/week    5 Cans of beer, 5 Shots of liquor per week    OB History    Grav Para Term Preterm Abortions TAB SAB Ect Mult Living                  Review of Systems  Constitutional: Negative for fever.  HENT: Positive for facial swelling and dental problem. Negative for hearing loss, ear pain,  nosebleeds, sore throat, drooling, trouble swallowing and voice change.   Respiratory: Negative for cough and shortness of breath.   Gastrointestinal: Negative for nausea and vomiting.  Neurological: Negative for headaches.    Allergies  Review of patient's allergies indicates no known allergies.  Home Medications   Current Outpatient Rx  Name  Route  Sig  Dispense  Refill  . AMLODIPINE BESYLATE 10 MG PO TABS   Oral   Take 10 mg by mouth daily. Needs office visit         . LISINOPRIL-HYDROCHLOROTHIAZIDE 20-12.5 MG PO TABS   Oral   Take 2 tablets by mouth daily.         Marland Kitchen METOPROLOL TARTRATE 50 MG PO TABS   Oral   Take 50 mg by mouth 2 (two) times daily. NEED OFFICE VISIT         . POTASSIUM CHLORIDE CRYS ER 10 MEQ PO TBCR   Oral   Take 10 mEq by mouth daily. Needs office visit         . NAPROXEN 500 MG PO TABS   Oral   Take 1 tablet (500 mg total) by mouth 2 (two) times daily with a meal.   30 tablet   0   . PENICILLIN V POTASSIUM 500 MG PO TABS   Oral   Take 1 tablet (500 mg total) by mouth 4 (four) times daily.   40 tablet   0  BP 175/93  Pulse 78  Temp 98 F (36.7 C) (Oral)  Resp 16  SpO2 100%  LMP 05/09/2012  Physical Exam  Nursing note and vitals reviewed. Constitutional: She is oriented to person, place, and time. She appears well-developed and well-nourished. No distress.  HENT:  Head: Normocephalic and atraumatic.  Mouth/Throat: Mucous membranes are normal. Abnormal dentition. No oropharyngeal exudate, posterior oropharyngeal edema, posterior oropharyngeal erythema or tonsillar abscesses.         Pt has damaged tooth and is waiting for insurance before seeking treatment  Eyes: EOM are normal. Pupils are equal, round, and reactive to light.  Neck: Normal range of motion. Neck supple.       No meningeal signs  Cardiovascular: Normal rate, regular rhythm and normal heart sounds.  Exam reveals no gallop and no friction rub.   No murmur  heard. Pulmonary/Chest: Effort normal and breath sounds normal. No respiratory distress. She has no wheezes. She has no rales. She exhibits no tenderness.  Abdominal: Soft. Bowel sounds are normal. She exhibits no distension. There is no tenderness. There is no rebound and no guarding.  Musculoskeletal: Normal range of motion. She exhibits no edema and no tenderness.  Neurological: She is alert and oriented to person, place, and time. No cranial nerve deficit.  Skin: Skin is warm and dry. She is not diaphoretic. No erythema.    ED Course  Procedures (including critical care time)  Labs Reviewed - No data to display No results found.   1. Pain, dental       MDM  Patient with toothache.  No gross abscess.  Exam unconcerning for Ludwig's angina or spread of infection.  Will treat with penicillin and NSAIDS.  Pt did not feel need for narcotic pain med at this time. Urged patient to follow-up with dentist.    Glade Nurse, PA-C 05/17/12 1451

## 2012-05-18 NOTE — ED Provider Notes (Signed)
Medical screening examination/treatment/procedure(s) were performed by non-physician practitioner and as supervising physician I was immediately available for consultation/collaboration.   Carleene Cooper III, MD 05/18/12 [id number]

## 2012-07-12 ENCOUNTER — Encounter: Payer: Self-pay | Admitting: Emergency Medicine

## 2012-07-29 ENCOUNTER — Ambulatory Visit (INDEPENDENT_AMBULATORY_CARE_PROVIDER_SITE_OTHER): Payer: Managed Care, Other (non HMO) | Admitting: Family Medicine

## 2012-07-29 VITALS — BP 228/152 | HR 90 | Temp 98.0°F | Resp 16 | Ht 62.5 in | Wt 181.0 lb

## 2012-07-29 DIAGNOSIS — R9431 Abnormal electrocardiogram [ECG] [EKG]: Secondary | ICD-10-CM

## 2012-07-29 DIAGNOSIS — R079 Chest pain, unspecified: Secondary | ICD-10-CM

## 2012-07-29 DIAGNOSIS — I10 Essential (primary) hypertension: Secondary | ICD-10-CM

## 2012-07-29 DIAGNOSIS — E049 Nontoxic goiter, unspecified: Secondary | ICD-10-CM

## 2012-07-29 LAB — POCT URINALYSIS DIPSTICK
Blood, UA: NEGATIVE
Glucose, UA: NEGATIVE
Ketones, UA: NEGATIVE
Spec Grav, UA: 1.015
Urobilinogen, UA: 0.2

## 2012-07-29 LAB — POCT UA - MICROSCOPIC ONLY
Bacteria, U Microscopic: NEGATIVE
Casts, Ur, LPF, POC: NEGATIVE
Mucus, UA: NEGATIVE

## 2012-07-29 LAB — TSH: TSH: 0.747 u[IU]/mL (ref 0.350–4.500)

## 2012-07-29 LAB — COMPREHENSIVE METABOLIC PANEL
CO2: 29 mEq/L (ref 19–32)
Calcium: 9.5 mg/dL (ref 8.4–10.5)
Chloride: 100 mEq/L (ref 96–112)
Creat: 0.75 mg/dL (ref 0.50–1.10)
Glucose, Bld: 84 mg/dL (ref 70–99)
Total Bilirubin: 1.6 mg/dL — ABNORMAL HIGH (ref 0.3–1.2)

## 2012-07-29 LAB — LIPID PANEL
Cholesterol: 181 mg/dL (ref 0–200)
HDL: 51 mg/dL (ref >39)
Total CHOL/HDL Ratio: 3.5 Ratio

## 2012-07-29 LAB — MICROALBUMIN, URINE: Microalb, Ur: 7.24 mg/dL — ABNORMAL HIGH (ref 0.00–1.89)

## 2012-07-29 MED ORDER — AMLODIPINE BESYLATE 10 MG PO TABS: 10.0000 mg | ORAL_TABLET | Freq: Every day | ORAL | Status: DC

## 2012-07-29 MED ORDER — LISINOPRIL 40 MG PO TABS: 40.0000 mg | ORAL_TABLET | Freq: Every day | ORAL | Status: DC

## 2012-07-29 MED ORDER — ATENOLOL 100 MG PO TABS: 100.0000 mg | ORAL_TABLET | Freq: Every day | ORAL | Status: DC

## 2012-07-29 MED ORDER — POTASSIUM CHLORIDE CRYS ER 10 MEQ PO TBCR: 10.0000 meq | EXTENDED_RELEASE_TABLET | Freq: Every day | ORAL | Status: DC

## 2012-07-29 MED ORDER — TRIAMTERENE-HCTZ 37.5-25 MG PO TABS: 1.0000 | ORAL_TABLET | Freq: Every day | ORAL | Status: DC

## 2012-07-29 NOTE — Progress Notes (Addendum)
Subjective:    Patient ID: [name], female    DOB: 1970-09-26, 42 y.o.   MRN: [id number] Chief Complaint  Patient presents with  . Follow-up    pt says she's here for a physical but needs to get blood pressure down first    HPI [name] is here to address her blood pressure. Her whole family has high blood pressure but they are all much higher med doses than her even though her baseline pressure is much higher.  She would like to try to max out med doses to really get her bp normal.  However, pt readily admits that her primary problems w/ bp control is compliance but she really wants to turn over a new leaf.  She works 2 jobs - one on the weekend and during the week her 12 hr shifts change from 1st to 2nd to 3rd every few days so she had no routine and her BP meds always get lost in the shuffle. Has tried keeping them at home, at work, in her car but always forgets. Is considering buying a med dispenser box. She also has 4 kids at home -  12 -74 - 28- 38 yo and her 43 yo is pregnant.   She went to the gynecologist this week and was manual systolic BP was 134 which is the lowest she has ever seen it. She is having chest pains but states she has had them for years and has several cardiac r/o in the ER though pt denies ever seeing a cardiology or having a stress test.  Past Medical History  Diagnosis Date  . Hypertension   . Blood transfusion without reported diagnosis    No current outpatient prescriptions on file prior to visit.   No current facility-administered medications on file prior to visit.   No Known Allergies   Review of Systems  Constitutional: Positive for fatigue. Negative for fever, chills, diaphoresis and appetite change.  HENT: Negative for neck pain and neck stiffness.        Anterior neck swelling  Eyes: Negative for visual disturbance.  Respiratory: Positive for chest tightness. Negative for cough and shortness of breath.   Cardiovascular: Positive for chest  pain. Negative for palpitations and leg swelling.  Genitourinary: Negative for decreased urine volume.  Neurological: Positive for headaches. Negative for syncope.  Hematological: Does not bruise/bleed easily.      BP 228/152  Pulse 90  Temp(Src) 98 F (36.7 C) (Oral)  Resp 16  Ht 5' 2.5" (1.588 m)  Wt 181 lb (82.101 kg)  BMI 32.56 kg/m2  SpO2 99% Objective:   Physical Exam  Constitutional: She is oriented to person, place, and time. She appears well-developed and well-nourished. No distress.  HENT:  Head: Normocephalic and atraumatic.  Right Ear: External ear normal.  Left Ear: External ear normal.  Eyes: Conjunctivae are normal. No scleral icterus.  Neck: Normal range of motion and full passive range of motion without pain. Neck supple. Thyromegaly present.  Cardiovascular: Normal rate, regular rhythm, normal heart sounds and intact distal pulses.   Pulmonary/Chest: Effort normal and breath sounds normal. No respiratory distress.  Musculoskeletal: She exhibits no edema.  Lymphadenopathy:    She has no cervical adenopathy.  Neurological: She is alert and oriented to person, place, and time.  Skin: Skin is warm and dry. She is not diaphoretic. No erythema.  Psychiatric: She has a normal mood and affect. Her behavior is normal.      Results for orders  placed in visit on 07/29/12  COMPREHENSIVE METABOLIC PANEL      Result Value Range   Sodium 139  135 - 145 mEq/L   Potassium 3.8  3.5 - 5.3 mEq/L   Chloride 100  96 - 112 mEq/L   CO2 29  19 - 32 mEq/L   Glucose, Bld 84  70 - 99 mg/dL   BUN 10  6 - 23 mg/dL   Creat 9.60  4.54 - 0.98 mg/dL   Total Bilirubin 1.6 (*) 0.3 - 1.2 mg/dL   Alkaline Phosphatase 66  39 - 117 U/L   AST 25  0 - 37 U/L   ALT 21  0 - 35 U/L   Total Protein 7.3  6.0 - 8.3 g/dL   Albumin 4.7  3.5 - 5.2 g/dL   Calcium 9.5  8.4 - 11.9 mg/dL  TSH      Result Value Range   TSH 0.747  0.350 - 4.500 uIU/mL  LIPID PANEL      Result Value Range    Cholesterol 181  0 - 200 mg/dL   Triglycerides 147  <829 mg/dL   HDL 51  >56 mg/dL   Total CHOL/HDL Ratio 3.5     VLDL 22  0 - 40 mg/dL   LDL Cholesterol 213 (*) 0 - 99 mg/dL  MICROALBUMIN, URINE      Result Value Range   Microalb, Ur 7.24 (*) 0.00 - 1.89 mg/dL  POCT GLYCOSYLATED HEMOGLOBIN (HGB A1C)      Result Value Range   Hemoglobin A1C 4.8    POCT UA - MICROSCOPIC ONLY      Result Value Range   WBC, Ur, HPF, POC 1-3     RBC, urine, microscopic 0-1     Bacteria, U Microscopic neg     Mucus, UA neg     Epithelial cells, urine per micros 2-5     Crystals, Ur, HPF, POC neg     Casts, Ur, LPF, POC neg     Yeast, UA neg    POCT URINALYSIS DIPSTICK      Result Value Range   Color, UA yellow     Clarity, UA clear     Glucose, UA neg     Bilirubin, UA neg     Ketones, UA neg     Spec Grav, UA 1.015     Blood, UA neg     pH, UA 7.5     Protein, UA 30     Urobilinogen, UA 0.2     Nitrite, UA neg     Leukocytes, UA Negative     [facility] reading (PRIMARY) by  Dr. Clelia Croft. EKG:  NSR, LA and LV hypertrophy  Assessment & Plan:   HTN (hypertension), malignant - Plan: Comprehensive metabolic panel, TSH, Lipid panel, POCT glycosylated hemoglobin (Hb A1C), Microalbumin, urine, EKG 12-Lead, POCT UA - Microscopic Only, POCT urinalysis dipstick, Ambulatory referral to Cardiology d/c lisinopril/hctz and increase lisinopril from 20 to 40, increase amlodipine from 5 to 10, and change hctz to maxzide.  Will need bmp at f/u.  Spent >20 minutes discussing w/ pt ways to increase her compliance with medication - keep some at work, in car, and at home, set a phone timer, start a med box - and w/ dr's visits - options of 102 and 104 walk-in vs scheduling Clonidine 0.2mg  po x 1 given in office w/ BP decreased to 180/110 after 30 min.  Nonspecific abnormal electrocardiogram (ECG) (EKG) - Plan: Ambulatory referral  to Cardiology  Chest pain - Plan: Ambulatory referral to Cardiology  Goiter - Plan: [place] Soft  Tissue Head/Neck  Pt needs 30 d supply sent to local pharmacy so she can start on meds immed but also needs 90d supply sent to pharm. Needs to f/u before any further refills  Meds ordered this encounter  Medications  . DISCONTD: amLODipine (NORVASC) 10 MG tablet    Sig: Take 1 tablet (10 mg total) by mouth daily.    Dispense:  30 tablet    Refill:  0  . DISCONTD: lisinopril (PRINIVIL,ZESTRIL) 40 MG tablet    Sig: Take 1 tablet (40 mg total) by mouth daily.    Dispense:  30 tablet    Refill:  0  . DISCONTD: triamterene-hydrochlorothiazide (MAXZIDE-25) 37.5-25 MG per tablet    Sig: Take 1 each (1 tablet total) by mouth daily.    Dispense:  30 tablet    Refill:  0  . DISCONTD: atenolol (TENORMIN) 100 MG tablet    Sig: Take 1 tablet (100 mg total) by mouth daily.    Dispense:  30 tablet    Refill:  0  . DISCONTD: potassium chloride (K-DUR,KLOR-CON) 10 MEQ tablet    Sig: Take 1 tablet (10 mEq total) by mouth daily.    Dispense:  30 tablet    Refill:  0  . amLODipine (NORVASC) 10 MG tablet    Sig: Take 1 tablet (10 mg total) by mouth daily.    Dispense:  90 tablet    Refill:  0  . atenolol (TENORMIN) 100 MG tablet    Sig: Take 1 tablet (100 mg total) by mouth daily.    Dispense:  90 tablet    Refill:  0  . lisinopril (PRINIVIL,ZESTRIL) 40 MG tablet    Sig: Take 1 tablet (40 mg total) by mouth daily.    Dispense:  90 tablet    Refill:  0  . potassium chloride (K-DUR,KLOR-CON) 10 MEQ tablet    Sig: Take 1 tablet (10 mEq total) by mouth daily.    Dispense:  90 tablet    Refill:  0  . triamterene-hydrochlorothiazide (MAXZIDE-25) 37.5-25 MG per tablet    Sig: Take 1 each (1 tablet total) by mouth daily.    Dispense:  90 tablet    Refill:  0

## 2012-07-29 NOTE — Patient Instructions (Signed)
Potassium Content of Foods Potassium is a mineral. The body needs potassium to control blood pressure and to keep the muscles and nervous system healthy. Most foods contain potassium. Eating a variety of foods in the right amounts will help control the level of potassium in your body. Kidney problems can cause there to be too much potassium in the body. If this happens, you may need to lower the amount of potassium in your diet. Some medicines, such as diuretics, may cause your body to lose too much potassium. If this happens, you may need to increase the amount of potassium in your diet. COMMON SERVING SIZES The list below tells you how big or small some common portion sizes are:  1 oz.........4 stacked dice.  3 oz.........Deck of cards.  1 tsp........Tip of little finger.  1 tbs........Thumb.  2 tbs........Golf ball.   cup.......Half of a fist.  1 cup........A fist. FOODS AND DRINKS HIGH IN POTASSIUM More than 250 mg per serving.  Bran cereals and other bran products.  Milk (skim, 1%, 2%, whole).  Buttermilk.  Yogurt.  Avocados.  Bananas.  Dried fruits.  Kiwis.  Oranges.  Prunes.  Raisins.  Baked beans.  Spinach.  Tomatoes.  Peanut butter.  Nuts.  Tofu.  Potatoes. FOODS MODERATE IN POTASSIUM Between 150 to 250 mg per serving.  Cherries.  Mangoes.  Asparagus.  Peas.  Zucchini.  Celery.  [place].  Fresh peaches.  Broccoli stalks.  Peppers.  Figs.  Fresh pears.  Kale.  Summer squashes. FOODS LOW IN POTASSIUM Less than 150 mg per serving.  Pasta.  Rice.  Cottage cheese.  Cheddar cheese.  Apples.  Grapes.  Pineapple.  Raspberries.  Strawberries.  Watermelon.  Green beans.  Cabbage.  Cauliflower.  Corn.  Mushrooms.  Onions.  Eggs. Document Released: 11/26/2004 Document Revised: 07/07/2011 Document Reviewed: 08/29/2011 [employer] Patient Information 2013 [employer].  

## 2012-08-06 ENCOUNTER — Encounter: Payer: Self-pay | Admitting: Radiology

## 2012-08-07 ENCOUNTER — Telehealth: Payer: Self-pay

## 2012-08-07 NOTE — Telephone Encounter (Signed)
PATIENT CALLED WANTING TO SPEAK TO DR. SHAW, SHE FEELS WORSE AND WAS HAVING QUESTIONS IN REGARDS TO REFERRAL. PATIENT STATES SHE HAS QUESTIONS IN REGARDS TO BLOOD PRESSURE AND HER KIDNEYS. PLEASE CALL BACK AT:  [phone]

## 2012-08-09 NOTE — Telephone Encounter (Signed)
Pt is calling back bout her lab results from her last visit. Her call back number is [phone]

## 2012-08-09 NOTE — Telephone Encounter (Signed)
I did refer pt to cardiology.  If her blood pressure is not coming down, we will need to start a new medicine and since we just increased and started a lot of medicines, she will need a visit for bp check and labs to check on her kidneys before we can do this.  Please have her come in this wk.

## 2012-08-09 NOTE — Telephone Encounter (Signed)
Left message for her to call me back, so I can advise.  

## 2012-08-10 NOTE — Telephone Encounter (Signed)
Called again left another message for her to call me back.

## 2012-08-10 NOTE — Telephone Encounter (Signed)
Pt advised of labs liver/kidneys she does have an appt. Scheduled with Cardiology and will follow up with Dr Clelia Croft on Friday.

## 2012-08-13 ENCOUNTER — Ambulatory Visit: Payer: Managed Care, Other (non HMO) | Admitting: Family Medicine

## 2012-08-15 ENCOUNTER — Telehealth: Payer: Self-pay | Admitting: Family Medicine

## 2012-08-15 NOTE — Telephone Encounter (Signed)
Pt cancelled her appt this past Friday.  Because we increased her BP med doses so much and started several new BP meds it is VERY important that we repeat her labs and make sure her kidneys and the salts in her blood have not been adversely affected by the medications and ensure that the medications are working correctly.  Please have her come in on Tues or Wed evening this wk for recheck.

## 2012-08-16 NOTE — Telephone Encounter (Signed)
Called her, phone not accepting calls.

## 2012-08-18 NOTE — Telephone Encounter (Signed)
Called again. Same recording. Person unavailable. Letter sent, to you [facility]

## 2012-08-20 ENCOUNTER — Encounter: Payer: Self-pay | Admitting: *Deleted

## 2012-08-20 ENCOUNTER — Encounter: Payer: Self-pay | Admitting: Cardiovascular Disease

## 2012-08-23 ENCOUNTER — Ambulatory Visit (INDEPENDENT_AMBULATORY_CARE_PROVIDER_SITE_OTHER): Payer: Managed Care, Other (non HMO) | Admitting: Cardiovascular Disease

## 2012-08-23 ENCOUNTER — Encounter: Payer: Self-pay | Admitting: Cardiovascular Disease

## 2012-08-23 VITALS — BP 155/95 | HR 72 | Ht 62.0 in | Wt 183.0 lb

## 2012-08-23 DIAGNOSIS — I10 Essential (primary) hypertension: Secondary | ICD-10-CM

## 2012-08-23 DIAGNOSIS — Z8679 Personal history of other diseases of the circulatory system: Secondary | ICD-10-CM

## 2012-08-23 DIAGNOSIS — I517 Cardiomegaly: Secondary | ICD-10-CM | POA: Insufficient documentation

## 2012-08-23 NOTE — Assessment & Plan Note (Signed)
History of cardiomegaly in setting of severe HTN  Check echo to assess LVH and EF

## 2012-08-23 NOTE — Progress Notes (Signed)
Patient ID: [name], female   DOB: [birth year], 42 y.o.   MRN: [id number] 42 yo with HTN referred by Dr Clelia Croft for HTN history of CE and ? Chest pain. 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 Discussed risk of CVA and CRF if she is noncompliant.  No dyspnea, palpitations or syncope I reveiwed her ECG from 4/3 and is normal with surprisingly no LVH by voltage criteria.  Denies ETOH excess drugs or stimulants.  Also has 4 kids at home   ROS: Denies fever, malais, weight loss, blurry vision, decreased visual acuity, cough, sputum, SOB, hemoptysis, pleuritic pain, palpitaitons, heartburn, abdominal pain, melena, lower extremity edema, claudication, or rash.  All other systems reviewed and negative  General: Affect appropriate Healthy:  appears stated age HEENT: normal Neck supple with no adenopathy JVP normal no bruits no thyromegaly Lungs clear with no wheezing and good diaphragmatic motion Heart:  S1/S2 no murmur, no rub, gallop or click PMI normal Abdomen: benighn, BS positve, no tenderness, no AAA no bruit.  No HSM or HJR Distal pulses intact with no bruits No edema Neuro non-focal Skin warm and dry No muscular weakness   Current Outpatient Prescriptions  Medication Sig Dispense Refill  . amLODipine (NORVASC) 10 MG tablet Take 1 tablet (10 mg total) by mouth daily.  90 tablet  0  . atenolol (TENORMIN) 100 MG tablet Take 1 tablet (100 mg total) by mouth daily.  90 tablet  0  . lisinopril (PRINIVIL,ZESTRIL) 40 MG tablet Take 1 tablet (40 mg total) by mouth daily.  90 tablet  0  . potassium chloride (K-DUR,KLOR-CON) 10 MEQ tablet Take 1 tablet (10 mEq total) by mouth daily.  90 tablet  0  . triamterene-hydrochlorothiazide (MAXZIDE-25) 37.5-25 MG per tablet Take 1 each (1 tablet total) by mouth daily.  90 tablet  0   No current facility-administered  medications for this visit.    Allergies  Review of patient's allergies indicates no known allergies.  Electrocardiogram:  07/29/12  SR rate 77 normal ECG  Assessment and Plan

## 2012-08-23 NOTE — Patient Instructions (Signed)

## 2012-08-23 NOTE — Assessment & Plan Note (Signed)
May need 4 drug Rx Should be controlled if she is compliant Continue to restrict sodium Secondary w/u per primary

## 2012-08-25 ENCOUNTER — Telehealth: Payer: Self-pay

## 2012-08-25 NOTE — Telephone Encounter (Signed)
Pt is returning Amy's call Call back number is [phone]

## 2012-08-25 NOTE — Telephone Encounter (Signed)
Spoke to patient, she states she does plan to come in tomorrow for recheck with Dr Clelia Croft. To you FYI

## 2012-08-27 ENCOUNTER — Ambulatory Visit (INDEPENDENT_AMBULATORY_CARE_PROVIDER_SITE_OTHER): Payer: Managed Care, Other (non HMO) | Admitting: Family Medicine

## 2012-08-27 ENCOUNTER — Encounter: Payer: Self-pay | Admitting: Family Medicine

## 2012-08-27 VITALS — BP 134/86 | HR 53 | Temp 98.1°F | Resp 16 | Ht 62.0 in | Wt 184.2 lb

## 2012-08-27 DIAGNOSIS — Z79899 Other long term (current) drug therapy: Secondary | ICD-10-CM

## 2012-08-27 DIAGNOSIS — I10 Essential (primary) hypertension: Secondary | ICD-10-CM

## 2012-08-27 LAB — BASIC METABOLIC PANEL
BUN: 10 mg/dL (ref 6–23)
CO2: 26 mEq/L (ref 19–32)
Chloride: 102 mEq/L (ref 96–112)
Creat: 0.74 mg/dL (ref 0.50–1.10)
Glucose, Bld: 118 mg/dL — ABNORMAL HIGH (ref 70–99)

## 2012-08-27 MED ORDER — LISINOPRIL 40 MG PO TABS: 40.0000 mg | ORAL_TABLET | Freq: Every day | ORAL | Status: DC

## 2012-08-27 MED ORDER — TRIAMTERENE-HCTZ 37.5-25 MG PO TABS: 1.0000 | ORAL_TABLET | Freq: Every day | ORAL | Status: DC

## 2012-08-27 MED ORDER — POTASSIUM CHLORIDE CRYS ER 10 MEQ PO TBCR: 10.0000 meq | EXTENDED_RELEASE_TABLET | Freq: Every day | ORAL | Status: DC

## 2012-08-27 MED ORDER — AMLODIPINE BESYLATE 10 MG PO TABS: 10.0000 mg | ORAL_TABLET | Freq: Every day | ORAL | Status: DC

## 2012-08-27 MED ORDER — ATENOLOL 100 MG PO TABS: 100.0000 mg | ORAL_TABLET | Freq: Every day | ORAL | Status: DC

## 2012-08-27 NOTE — Progress Notes (Signed)
  Subjective:    Patient ID: [name], female    DOB: [birth year], 42 y.o.   MRN: [id number] Chief Complaint  Patient presents with  . Hypertension    follow up   HPI  Has echo sched for next Thurs.  No more chest pains, no more heart burn, and no side effects from the medications.  Take them all every morning but did not take them this morning - she takes them when she gets to works.  At home BP is 140s-150s.     Review of Systems    BP 134/86  Pulse 53  Temp(Src) 98.1 F (36.7 C) (Oral)  Resp 16  Ht 5\' 2"  (1.575 m)  Wt 184 lb 3.2 oz (83.553 kg)  BMI 33.68 kg/m2  SpO2 97%  LMP 08/07/2012 Objective:   Physical Exam        Assessment & Plan:  Unspecified essential hypertension - Plan: Basic metabolic panel  Meds ordered this encounter  Medications  . DISCONTD: amLODipine (NORVASC) 10 MG tablet    Sig: Take 1 tablet (10 mg total) by mouth daily.    Dispense:  30 tablet    Refill:  0  . DISCONTD: atenolol (TENORMIN) 100 MG tablet    Sig: Take 1 tablet (100 mg total) by mouth daily.    Dispense:  30 tablet    Refill:  0  . DISCONTD: lisinopril (PRINIVIL,ZESTRIL) 40 MG tablet    Sig: Take 1 tablet (40 mg total) by mouth daily.    Dispense:  30 tablet    Refill:  0  . DISCONTD: potassium chloride (K-DUR,KLOR-CON) 10 MEQ tablet    Sig: Take 1 tablet (10 mEq total) by mouth daily.    Dispense:  30 tablet    Refill:  0  . DISCONTD: triamterene-hydrochlorothiazide (MAXZIDE-25) 37.5-25 MG per tablet    Sig: Take 1 tablet by mouth daily.    Dispense:  30 tablet    Refill:  0  . amLODipine (NORVASC) 10 MG tablet    Sig: Take 1 tablet (10 mg total) by mouth daily.    Dispense:  30 tablet    Refill:  0  . atenolol (TENORMIN) 100 MG tablet    Sig: Take 1 tablet (100 mg total) by mouth daily.    Dispense:  30 tablet    Refill:  0  . potassium chloride (K-DUR,KLOR-CON) 10 MEQ tablet    Sig: Take 1 tablet (10 mEq total) by mouth daily.    Dispense:  30 tablet   Refill:  0  . lisinopril (PRINIVIL,ZESTRIL) 40 MG tablet    Sig: Take 1 tablet (40 mg total) by mouth daily.    Dispense:  30 tablet    Refill:  0  . triamterene-hydrochlorothiazide (MAXZIDE-25) 37.5-25 MG per tablet    Sig: Take 1 tablet by mouth daily.    Dispense:  30 tablet    Refill:  0

## 2012-08-27 NOTE — Patient Instructions (Addendum)
There is nothing wrong with your liver. It is likely that you have "[place] syndrome" which is when the bilirubin increases when you are under stress.  Your liver is functioning normally. Follow-up with me in 6 wks.  Start exercising by walking in the afternoons.  Please loose 5 lbs in the next month so that we can avoid starting you on another BP medication at your next OV.  DASH Diet The DASH diet stands for "Dietary Approaches to Stop Hypertension." It is a healthy eating plan that has been shown to reduce high blood pressure (hypertension) in as little as 14 days, while also possibly providing other significant health benefits. These other health benefits include reducing the risk of breast cancer after menopause and reducing the risk of type 2 diabetes, heart disease, colon cancer, and stroke. Health benefits also include weight loss and slowing kidney failure in patients with chronic kidney disease.  DIET GUIDELINES  Limit salt (sodium). Your diet should contain less than 1500 mg of sodium daily.  Limit refined or processed carbohydrates. Your diet should include mostly whole grains. Desserts and added sugars should be used sparingly.  Include small amounts of heart-healthy fats. These types of fats include nuts, oils, and tub margarine. Limit saturated and trans fats. These fats have been shown to be harmful in the body. CHOOSING FOODS  The following food groups are based on a 2000 calorie diet. See your Registered Dietitian for individual calorie needs. Grains and Grain Products (6 to 8 servings daily)  Eat More Often: Whole-wheat bread, brown rice, whole-grain or wheat pasta, quinoa, popcorn without added fat or salt (air popped).  Eat Less Often: White bread, white pasta, white rice, cornbread. Vegetables (4 to 5 servings daily)  Eat More Often: Fresh, frozen, and canned vegetables. Vegetables may be raw, steamed, roasted, or grilled with a minimal amount of fat.  Eat Less  Often/Avoid: Creamed or fried vegetables. Vegetables in a cheese sauce. Fruit (4 to 5 servings daily)  Eat More Often: All fresh, canned (in natural juice), or frozen fruits. Dried fruits without added sugar. One hundred percent fruit juice ( cup [237 mL] daily).  Eat Less Often: Dried fruits with added sugar. Canned fruit in light or heavy syrup. [employer], Fish, and Poultry (2 servings or less daily. One serving is 3 to 4 oz [85-114 g]).  Eat More Often: Ninety percent or leaner ground beef, tenderloin, sirloin. Round cuts of beef, chicken breast, [place] breast. All fish. Grill, bake, or broil your meat. Nothing should be fried.  Eat Less Often/Avoid: Fatty cuts of meat, [place], or chicken leg, thigh, or wing. Fried cuts of meat or fish. Dairy (2 to 3 servings)  Eat More Often: Low-fat or fat-free milk, low-fat plain or light yogurt, reduced-fat or part-skim cheese.  Eat Less Often/Avoid: Milk (whole, 2%).Whole milk yogurt. Full-fat cheeses. Nuts, Seeds, and Legumes (4 to 5 servings per week)  Eat More Often: All without added salt.  Eat Less Often/Avoid: Salted nuts and seeds, canned beans with added salt. Fats and Sweets (limited)  Eat More Often: Vegetable oils, tub margarines without trans fats, sugar-free gelatin. Mayonnaise and salad dressings.  Eat Less Often/Avoid: Coconut oils, palm oils, butter, stick margarine, cream, half and half, cookies, candy, pie. FOR MORE INFORMATION The Dash Diet Eating Plan: www.dashdiet.org Document Released: 04/03/2011 Document Revised: 07/07/2011 Document Reviewed: 04/03/2011 [facility] Patient Information 2013 [place], [place].

## 2012-08-28 ENCOUNTER — Telehealth: Payer: Self-pay

## 2012-08-28 NOTE — Telephone Encounter (Signed)
Accidentally reviewed the labs in the lab pool. Unable to LM on VM. Try again later to call her and let her know Potassium level perfect so we will continue the potassium supplement.

## 2012-08-30 NOTE — Telephone Encounter (Signed)
Called her to advise. She was unavailable per recording, I could not leave a message.

## 2012-09-02 ENCOUNTER — Ambulatory Visit (HOSPITAL_COMMUNITY): Payer: Managed Care, Other (non HMO) | Attending: Cardiovascular Disease

## 2012-09-02 DIAGNOSIS — I517 Cardiomegaly: Secondary | ICD-10-CM | POA: Insufficient documentation

## 2012-09-02 DIAGNOSIS — I10 Essential (primary) hypertension: Secondary | ICD-10-CM | POA: Insufficient documentation

## 2012-09-02 DIAGNOSIS — Z8679 Personal history of other diseases of the circulatory system: Secondary | ICD-10-CM

## 2012-09-02 NOTE — Progress Notes (Signed)
Echocardiogram performed.  

## 2012-09-03 ENCOUNTER — Telehealth: Payer: Self-pay

## 2012-09-03 ENCOUNTER — Other Ambulatory Visit: Payer: Self-pay | Admitting: Radiology

## 2012-09-03 NOTE — Telephone Encounter (Signed)
e

## 2012-09-03 NOTE — Telephone Encounter (Signed)
Thanks, patient advised.  

## 2012-09-03 NOTE — Telephone Encounter (Signed)
Called again, left a message for her to call me back about the labs.

## 2012-09-08 ENCOUNTER — Telehealth: Payer: Self-pay

## 2012-09-08 ENCOUNTER — Telehealth: Payer: Self-pay | Admitting: Cardiovascular Disease

## 2012-09-08 NOTE — Telephone Encounter (Signed)
Yes, they appear to be correct. Called her, she should verify with the pharmacy also

## 2012-09-08 NOTE — Telephone Encounter (Signed)
LMTCB ./CY 

## 2012-09-08 NOTE — Telephone Encounter (Signed)
Pt would like to know if the dosages are correct for the atenolol 100 mg and the amplodipine 10 mg. Best# [phone]

## 2012-09-08 NOTE — Telephone Encounter (Signed)
New problem    Pt states someone called her but did not know who and there was not a vm left-but pt calling to see if it was regarding results

## 2012-09-09 ENCOUNTER — Telehealth: Payer: Self-pay

## 2012-09-09 NOTE — Telephone Encounter (Signed)
She should come in. Left message for her to call me back, I have advised her in message to come in.

## 2012-09-09 NOTE — Telephone Encounter (Signed)
Patient states throat feels as though it is swelling. States that an ultrasound was to be ordered to check her thyroid. Please call at [phone]. Patient says it is OK to leave VM if she does not answer.

## 2012-09-09 NOTE — Telephone Encounter (Signed)
PT AWARE OF ECHO RESULTS./CY 

## 2012-09-09 NOTE — Telephone Encounter (Signed)
F/u   Pt returning your call-if before 9a call [phone] after 9a call [phone]

## 2012-09-10 NOTE — Telephone Encounter (Signed)
Called again, left another message she should come in.

## 2012-10-08 ENCOUNTER — Encounter: Payer: Self-pay | Admitting: Family Medicine

## 2012-10-08 ENCOUNTER — Ambulatory Visit (INDEPENDENT_AMBULATORY_CARE_PROVIDER_SITE_OTHER): Payer: Managed Care, Other (non HMO) | Admitting: Family Medicine

## 2012-10-08 VITALS — BP 126/78 | HR 65 | Temp 98.2°F | Resp 16 | Ht 62.5 in | Wt 181.6 lb

## 2012-10-08 DIAGNOSIS — Z79899 Other long term (current) drug therapy: Secondary | ICD-10-CM

## 2012-10-08 DIAGNOSIS — I10 Essential (primary) hypertension: Secondary | ICD-10-CM

## 2012-10-08 DIAGNOSIS — E049 Nontoxic goiter, unspecified: Secondary | ICD-10-CM

## 2012-10-08 MED ORDER — POTASSIUM CHLORIDE CRYS ER 10 MEQ PO TBCR: 10.0000 meq | EXTENDED_RELEASE_TABLET | Freq: Every day | ORAL | Status: DC

## 2012-10-08 MED ORDER — AMLODIPINE BESYLATE 10 MG PO TABS: 10.0000 mg | ORAL_TABLET | Freq: Every day | ORAL | Status: DC

## 2012-10-08 MED ORDER — LISINOPRIL 40 MG PO TABS: 40.0000 mg | ORAL_TABLET | Freq: Every day | ORAL | Status: DC

## 2012-10-08 MED ORDER — TRIAMTERENE-HCTZ 37.5-25 MG PO TABS: 1.0000 | ORAL_TABLET | Freq: Every day | ORAL | Status: DC

## 2012-10-08 NOTE — Progress Notes (Signed)
  Subjective:    Patient ID: [name], female    DOB: Oct 02, 1970, 42 y.o.   MRN: [id number] Chief Complaint  Patient presents with  . Hypertension    HPI  Diarrhea stopped. Has been having fullness in her right neck.   Checking BP at her mom's house. Got a weekly pill box. Taking pills every day but didn't not take them.  No sxs of low BP. Eyes are always red - and stay red - thinks it might be due to long hours and her makeup  Past Medical History  Diagnosis Date  . Hypertension   . Blood transfusion without reported diagnosis    No current outpatient prescriptions on file prior to visit.   No current facility-administered medications on file prior to visit.   No Known Allergies   Review of Systems  Constitutional: Negative for fever, chills, diaphoresis and appetite change.  Eyes: Positive for redness. Negative for pain, discharge, itching and visual disturbance.  Respiratory: Negative for cough and shortness of breath.   Cardiovascular: Negative for chest pain, palpitations and leg swelling.  Genitourinary: Negative for decreased urine volume.  Neurological: Negative for syncope and headaches.  Hematological: Does not bruise/bleed easily.      BP 126/78  Pulse 65  Temp(Src) 98.2 F (36.8 C) (Oral)  Resp 16  Ht 5' 2.5" (1.588 m)  Wt 181 lb 9.6 oz (82.373 kg)  BMI 32.67 kg/m2  SpO2 98%  LMP 10/03/2012 Objective:   Physical Exam  Constitutional: She is oriented to person, place, and time. She appears well-developed and well-nourished. No distress.  HENT:  Head: Normocephalic and atraumatic.  Right Ear: External ear normal.  Left Ear: External ear normal.  Eyes: Conjunctivae are normal. No scleral icterus.  Neck: Normal range of motion. Neck supple. No thyromegaly present.  Cardiovascular: Normal rate, regular rhythm, normal heart sounds and intact distal pulses.   Pulmonary/Chest: Effort normal and breath sounds normal. No respiratory distress.   Musculoskeletal: She exhibits no edema.  Lymphadenopathy:    She has no cervical adenopathy.  Neurological: She is alert and oriented to person, place, and time.  Skin: Skin is warm and dry. She is not diaphoretic. No erythema.  Psychiatric: She has a normal mood and affect. Her behavior is normal.          Assessment & Plan:  Goiter diffuse - Plan: US Soft Tissue Head/Neck, TSH, T3, free, T4, free  HTN (hypertension) - Plan: Comprehensive metabolic panel, DISCONTINUED: triamterene-hydrochlorothiazide (MAXZIDE-25) 37.5-25 MG per tablet, DISCONTINUED: potassium chloride (K-DUR,KLOR-CON) 10 MEQ tablet, DISCONTINUED: amLODipine (NORVASC) 10 MG tablet, DISCONTINUED: lisinopril (PRINIVIL,ZESTRIL) 40 MG tablet  Meds ordered this encounter  Medications  . triamterene-hydrochlorothiazide (MAXZIDE-25) 37.5-25 MG per tablet    Sig: Take 1 tablet by mouth daily.    Dispense:  30 tablet    Refill:  0  . potassium chloride (K-DUR,KLOR-CON) 10 MEQ tablet    Sig: Take 1 tablet (10 mEq total) by mouth daily.    Dispense:  30 tablet    Refill:  0  . amLODipine (NORVASC) 10 MG tablet    Sig: Take 1 tablet (10 mg total) by mouth daily.    Dispense:  30 tablet    Refill:  0  . lisinopril (PRINIVIL,ZESTRIL) 40 MG tablet    Sig: Take 1 tablet (40 mg total) by mouth daily.    Dispense:  30 tablet    Refill:  0

## 2012-10-09 LAB — TSH: TSH: 0.516 u[IU]/mL (ref 0.350–4.500)

## 2012-10-09 LAB — COMPREHENSIVE METABOLIC PANEL
Alkaline Phosphatase: 74 U/L (ref 39–117)
BUN: 15 mg/dL (ref 6–23)
Creat: 0.73 mg/dL (ref 0.50–1.10)
Glucose, Bld: 94 mg/dL (ref 70–99)
Sodium: 137 mEq/L (ref 135–145)
Total Bilirubin: 0.6 mg/dL (ref 0.3–1.2)

## 2012-10-30 ENCOUNTER — Telehealth: Payer: Self-pay

## 2012-10-30 DIAGNOSIS — I10 Essential (primary) hypertension: Secondary | ICD-10-CM

## 2012-10-30 NOTE — Telephone Encounter (Signed)
Pt is needing a refill on her medications.  She says there is five different ones.  Rosann Auerbach has faxed over a request two times.  She said they heard nothing from the first fax and the second one was sent today.  Clelia Croft is her doctor and she wants to know if we could call her in a 10-15 day supply of her medicines until the [place] mail-orders come through. [phone]

## 2012-11-01 ENCOUNTER — Other Ambulatory Visit: Payer: Managed Care, Other (non HMO)

## 2012-11-01 MED ORDER — LISINOPRIL 40 MG PO TABS: 40.0000 mg | ORAL_TABLET | Freq: Every day | ORAL | Status: DC

## 2012-11-01 MED ORDER — ATENOLOL 100 MG PO TABS: 100.0000 mg | ORAL_TABLET | Freq: Every day | ORAL | Status: DC

## 2012-11-01 MED ORDER — POTASSIUM CHLORIDE CRYS ER 10 MEQ PO TBCR: 10.0000 meq | EXTENDED_RELEASE_TABLET | Freq: Every day | ORAL | Status: DC

## 2012-11-01 MED ORDER — AMLODIPINE BESYLATE 10 MG PO TABS: 10.0000 mg | ORAL_TABLET | Freq: Every day | ORAL | Status: DC

## 2012-11-01 MED ORDER — TRIAMTERENE-HCTZ 37.5-25 MG PO TABS: 1.0000 | ORAL_TABLET | Freq: Every day | ORAL | Status: DC

## 2012-11-01 NOTE — Telephone Encounter (Signed)
Sent to mail order. Sent to [facility]

## 2013-03-03 ENCOUNTER — Other Ambulatory Visit: Payer: Self-pay

## 2013-05-12 ENCOUNTER — Other Ambulatory Visit: Payer: Self-pay

## 2013-05-12 DIAGNOSIS — I10 Essential (primary) hypertension: Secondary | ICD-10-CM

## 2013-05-12 NOTE — Telephone Encounter (Signed)
Pharm reqs 90 day RFs of lisinopril, amlodipine, atenolol, and triamterine -hctz. Pt is due for f/up. LMOM for pt to call back w/plan.

## 2013-05-13 NOTE — Telephone Encounter (Signed)
LMOM to CB. 

## 2013-05-14 NOTE — Telephone Encounter (Signed)
Pt returned your call on Saturday.  I gave her Dr. Raul Del schedule for next week, but she still wants a call back.

## 2013-05-16 MED ORDER — TRIAMTERENE-HCTZ 37.5-25 MG PO TABS: 1.0000 | ORAL_TABLET | Freq: Every day | ORAL | Status: DC

## 2013-05-16 MED ORDER — ATENOLOL 100 MG PO TABS: 100.0000 mg | ORAL_TABLET | Freq: Every day | ORAL | Status: DC

## 2013-05-16 MED ORDER — AMLODIPINE BESYLATE 10 MG PO TABS: 10.0000 mg | ORAL_TABLET | Freq: Every day | ORAL | Status: DC

## 2013-05-16 MED ORDER — LISINOPRIL 40 MG PO TABS: 40.0000 mg | ORAL_TABLET | Freq: Every day | ORAL | Status: DC

## 2013-05-16 NOTE — Telephone Encounter (Signed)
LMOM for pt that I sent in a 30 day supply. If she wants a 90 day she needs to come in to be seen.

## 2013-05-17 ENCOUNTER — Telehealth: Payer: Self-pay

## 2013-05-17 DIAGNOSIS — I10 Essential (primary) hypertension: Secondary | ICD-10-CM

## 2013-05-17 NOTE — Telephone Encounter (Signed)
Ok to refill all x 30d only. Absolutely NO further refills w/o OV.

## 2013-05-17 NOTE — Telephone Encounter (Signed)
PT STATES SHE IS OUT OF ALL 5 OF HER MEDS AND WAS TOLD IT WAS DENIED, WILL BE COMING IN ON Sunday TO SEE DR Brigitte Pulse AND WANTED TO KNOW IF SHE CAN GET ENOUGH OF ALL OF THEM UNTIL SEEN. PLEASE CALL [phone]    [facility] ON [address]

## 2013-05-18 MED ORDER — AMLODIPINE BESYLATE 10 MG PO TABS: 10.0000 mg | ORAL_TABLET | Freq: Every day | ORAL | Status: DC

## 2013-05-18 MED ORDER — LISINOPRIL 40 MG PO TABS: 40.0000 mg | ORAL_TABLET | Freq: Every day | ORAL | Status: DC

## 2013-05-18 MED ORDER — ATENOLOL 100 MG PO TABS: 100.0000 mg | ORAL_TABLET | Freq: Every day | ORAL | Status: DC

## 2013-05-18 MED ORDER — POTASSIUM CHLORIDE CRYS ER 10 MEQ PO TBCR: 10.0000 meq | EXTENDED_RELEASE_TABLET | Freq: Every day | ORAL | Status: DC

## 2013-05-18 MED ORDER — TRIAMTERENE-HCTZ 37.5-25 MG PO TABS: 1.0000 | ORAL_TABLET | Freq: Every day | ORAL | Status: DC

## 2013-05-18 NOTE — Telephone Encounter (Signed)
Sent in refill for medications. Advised pt she must come into the office for OV.

## 2013-05-19 ENCOUNTER — Encounter: Payer: Self-pay | Admitting: Family Medicine

## 2013-10-17 ENCOUNTER — Encounter (HOSPITAL_COMMUNITY): Payer: Self-pay | Admitting: Emergency Medicine

## 2013-10-17 ENCOUNTER — Emergency Department (HOSPITAL_COMMUNITY): Payer: Managed Care, Other (non HMO)

## 2013-10-17 DIAGNOSIS — E876 Hypokalemia: Secondary | ICD-10-CM | POA: Diagnosis present

## 2013-10-17 DIAGNOSIS — Z8249 Family history of ischemic heart disease and other diseases of the circulatory system: Secondary | ICD-10-CM

## 2013-10-17 DIAGNOSIS — Z833 Family history of diabetes mellitus: Secondary | ICD-10-CM

## 2013-10-17 DIAGNOSIS — J209 Acute bronchitis, unspecified: Principal | ICD-10-CM | POA: Diagnosis present

## 2013-10-17 DIAGNOSIS — I10 Essential (primary) hypertension: Secondary | ICD-10-CM | POA: Diagnosis present

## 2013-10-17 DIAGNOSIS — Z9119 Patient's noncompliance with other medical treatment and regimen: Secondary | ICD-10-CM

## 2013-10-17 DIAGNOSIS — Z91199 Patient's noncompliance with other medical treatment and regimen due to unspecified reason: Secondary | ICD-10-CM

## 2013-10-17 LAB — COMPREHENSIVE METABOLIC PANEL
ALBUMIN: 4.5 g/dL (ref 3.5–5.2)
ALT: 22 U/L (ref 0–35)
AST: 24 U/L (ref 0–37)
Alkaline Phosphatase: 78 U/L (ref 39–117)
BILIRUBIN TOTAL: 1.4 mg/dL — AB (ref 0.3–1.2)
BUN: 6 mg/dL (ref 6–23)
CO2: 25 mEq/L (ref 19–32)
CREATININE: 0.71 mg/dL (ref 0.50–1.10)
Calcium: 10 mg/dL (ref 8.4–10.5)
Chloride: 94 mEq/L — ABNORMAL LOW (ref 96–112)
GFR calc Af Amer: >90 mL/min (ref >90)
GFR calc non Af Amer: >90 mL/min (ref >90)
Glucose, Bld: 99 mg/dL (ref 70–99)
Potassium: 3.6 mEq/L — ABNORMAL LOW (ref 3.7–5.3)
SODIUM: 137 meq/L (ref 137–147)
Total Protein: 8.3 g/dL (ref 6.0–8.3)

## 2013-10-17 LAB — CBC WITH DIFFERENTIAL/PLATELET
BASOS PCT: 0 % (ref 0–1)
Basophils Absolute: 0.1 10*3/uL (ref 0.0–0.1)
Eosinophils Absolute: 0.1 10*3/uL (ref 0.0–0.7)
Eosinophils Relative: 1 % (ref 0–5)
HCT: 42.3 % (ref 36.0–46.0)
Hemoglobin: 14.2 g/dL (ref 12.0–15.0)
LYMPHS PCT: 13 % (ref 12–46)
Lymphs Abs: 2 10*3/uL (ref 0.7–4.0)
MCH: 30.3 pg (ref 26.0–34.0)
MCHC: 33.6 g/dL (ref 30.0–36.0)
MCV: 90.4 fL (ref 78.0–100.0)
Monocytes Absolute: 1.3 10*3/uL — ABNORMAL HIGH (ref 0.1–1.0)
Monocytes Relative: 9 % (ref 3–12)
NEUTROS PCT: 77 % (ref 43–77)
Neutro Abs: 11.3 10*3/uL — ABNORMAL HIGH (ref 1.7–7.7)
Platelets: 236 10*3/uL (ref 150–400)
RBC: 4.68 MIL/uL (ref 3.87–5.11)
RDW: 13.3 % (ref 11.5–15.5)
WBC: 14.8 10*3/uL — AB (ref 4.0–10.5)

## 2013-10-17 LAB — I-STAT TROPONIN, ED: Troponin i, poc: 0 ng/mL (ref 0.00–0.08)

## 2013-10-17 NOTE — ED Notes (Signed)
Pt states that she is here for SOB that started at 14:30. Pt states that with the SOB came CP. Pt states center chest with no radiation. Pt denies N/V. Pt states that her BP is usually elevated.

## 2013-10-18 ENCOUNTER — Inpatient Hospital Stay (HOSPITAL_COMMUNITY)
Admission: EM | Admit: 2013-10-18 | Discharge: 2013-10-19 | DRG: 203 | Disposition: A | Discharge status: Home / Self Care | Payer: Managed Care, Other (non HMO) | Attending: Internal Medicine | Admitting: Internal Medicine

## 2013-10-18 ENCOUNTER — Encounter (HOSPITAL_COMMUNITY): Payer: Self-pay | Admitting: Nurse Practitioner

## 2013-10-18 DIAGNOSIS — R079 Chest pain, unspecified: Secondary | ICD-10-CM

## 2013-10-18 DIAGNOSIS — J209 Acute bronchitis, unspecified: Secondary | ICD-10-CM

## 2013-10-18 DIAGNOSIS — G44209 Tension-type headache, unspecified, not intractable: Secondary | ICD-10-CM

## 2013-10-18 DIAGNOSIS — I5033 Acute on chronic diastolic (congestive) heart failure: Secondary | ICD-10-CM | POA: Insufficient documentation

## 2013-10-18 DIAGNOSIS — I517 Cardiomegaly: Secondary | ICD-10-CM

## 2013-10-18 DIAGNOSIS — R0602 Shortness of breath: Secondary | ICD-10-CM

## 2013-10-18 DIAGNOSIS — D259 Leiomyoma of uterus, unspecified: Secondary | ICD-10-CM

## 2013-10-18 DIAGNOSIS — R071 Chest pain on breathing: Secondary | ICD-10-CM

## 2013-10-18 DIAGNOSIS — I509 Heart failure, unspecified: Secondary | ICD-10-CM | POA: Insufficient documentation

## 2013-10-18 DIAGNOSIS — R0600 Dyspnea, unspecified: Secondary | ICD-10-CM

## 2013-10-18 DIAGNOSIS — I10 Essential (primary) hypertension: Secondary | ICD-10-CM | POA: Diagnosis present

## 2013-10-18 DIAGNOSIS — F1020 Alcohol dependence, uncomplicated: Secondary | ICD-10-CM

## 2013-10-18 LAB — TROPONIN I

## 2013-10-18 LAB — PRO B NATRIURETIC PEPTIDE: PRO B NATRI PEPTIDE: 708.6 pg/mL — AB (ref 0–125)

## 2013-10-18 LAB — TSH: TSH: 0.291 u[IU]/mL — ABNORMAL LOW (ref 0.350–4.500)

## 2013-10-18 MED ORDER — FUROSEMIDE 10 MG/ML IJ SOLN
40.0000 mg | Freq: Once | INTRAMUSCULAR | Status: AC
  Administered 2013-10-18: 40 mg via INTRAVENOUS
  Filled 2013-10-18: qty 4

## 2013-10-18 MED ORDER — AMLODIPINE BESYLATE 10 MG PO TABS
10.0000 mg | ORAL_TABLET | Freq: Every day | ORAL | Status: DC
  Administered 2013-10-18 – 2013-10-19 (×2): 10 mg via ORAL
  Filled 2013-10-18 (×2): qty 1

## 2013-10-18 MED ORDER — NITROGLYCERIN 0.4 MG SL SUBL
0.4000 mg | SUBLINGUAL_TABLET | SUBLINGUAL | Status: DC | PRN
  Administered 2013-10-18: 0.4 mg via SUBLINGUAL
  Filled 2013-10-18: qty 1

## 2013-10-18 MED ORDER — PREDNISONE 20 MG PO TABS
60.0000 mg | ORAL_TABLET | Freq: Once | ORAL | Status: AC
  Administered 2013-10-18: 60 mg via ORAL
  Filled 2013-10-18: qty 3

## 2013-10-18 MED ORDER — NITROGLYCERIN 2 % TD OINT
1.0000 [in_us] | TOPICAL_OINTMENT | Freq: Once | TRANSDERMAL | Status: AC
  Administered 2013-10-18: 1 [in_us] via TOPICAL
  Filled 2013-10-18: qty 1

## 2013-10-18 MED ORDER — PNEUMOCOCCAL VAC POLYVALENT 25 MCG/0.5ML IJ INJ: 0.5000 mL | INJECTION | INTRAMUSCULAR | Status: DC

## 2013-10-18 MED ORDER — ONDANSETRON HCL 4 MG/2ML IJ SOLN: 4.0000 mg | Freq: Four times a day (QID) | INTRAMUSCULAR | Status: DC | PRN

## 2013-10-18 MED ORDER — POTASSIUM CHLORIDE CRYS ER 10 MEQ PO TBCR
10.0000 meq | EXTENDED_RELEASE_TABLET | Freq: Every day | ORAL | Status: DC
  Administered 2013-10-18 – 2013-10-19 (×2): 10 meq via ORAL
  Filled 2013-10-18 (×2): qty 1

## 2013-10-18 MED ORDER — POTASSIUM CHLORIDE CRYS ER 20 MEQ PO TBCR
40.0000 meq | EXTENDED_RELEASE_TABLET | Freq: Once | ORAL | Status: AC
  Administered 2013-10-18: 40 meq via ORAL
  Filled 2013-10-18: qty 2

## 2013-10-18 MED ORDER — ATENOLOL 100 MG PO TABS
100.0000 mg | ORAL_TABLET | Freq: Every day | ORAL | Status: DC
Start: 2013-10-18 — End: 2013-10-19
  Administered 2013-10-18 – 2013-10-19 (×2): 100 mg via ORAL
  Filled 2013-10-18 (×2): qty 1

## 2013-10-18 MED ORDER — ENOXAPARIN SODIUM 40 MG/0.4ML ~~LOC~~ SOLN
40.0000 mg | SUBCUTANEOUS | Status: DC
  Administered 2013-10-18 – 2013-10-19 (×2): 40 mg via SUBCUTANEOUS
  Filled 2013-10-18 (×3): qty 0.4

## 2013-10-18 MED ORDER — ALBUTEROL SULFATE (2.5 MG/3ML) 0.083% IN NEBU: 2.5000 mg | INHALATION_SOLUTION | RESPIRATORY_TRACT | Status: DC | PRN

## 2013-10-18 MED ORDER — SODIUM CHLORIDE 0.9 % IV SOLN: 250.0000 mL | INTRAVENOUS | Status: DC | PRN

## 2013-10-18 MED ORDER — HYDRALAZINE HCL 20 MG/ML IJ SOLN: 10.0000 mg | Freq: Four times a day (QID) | INTRAMUSCULAR | Status: DC | PRN

## 2013-10-18 MED ORDER — ACETAMINOPHEN 325 MG PO TABS
650.0000 mg | ORAL_TABLET | ORAL | Status: DC | PRN
  Administered 2013-10-18: 650 mg via ORAL
  Filled 2013-10-18: qty 2

## 2013-10-18 MED ORDER — PNEUMOCOCCAL VAC POLYVALENT 25 MCG/0.5ML IJ INJ
0.5000 mL | INJECTION | INTRAMUSCULAR | Status: AC
  Administered 2013-10-18: 0.5 mL via INTRAMUSCULAR
  Filled 2013-10-18: qty 0.5

## 2013-10-18 MED ORDER — SODIUM CHLORIDE 0.9 % IJ SOLN: 3.0000 mL | Freq: Two times a day (BID) | INTRAMUSCULAR | Status: DC

## 2013-10-18 MED ORDER — IPRATROPIUM-ALBUTEROL 0.5-2.5 (3) MG/3ML IN SOLN
3.0000 mL | Freq: Once | RESPIRATORY_TRACT | Status: AC
  Administered 2013-10-18: 3 mL via RESPIRATORY_TRACT
  Filled 2013-10-18: qty 3

## 2013-10-18 MED ORDER — LISINOPRIL 40 MG PO TABS
40.0000 mg | ORAL_TABLET | Freq: Every day | ORAL | Status: DC
  Administered 2013-10-18 – 2013-10-19 (×2): 40 mg via ORAL
  Filled 2013-10-18 (×2): qty 1

## 2013-10-18 MED ORDER — SODIUM CHLORIDE 0.9 % IJ SOLN: 3.0000 mL | INTRAMUSCULAR | Status: DC | PRN

## 2013-10-18 MED ORDER — LEVOFLOXACIN 750 MG PO TABS
750.0000 mg | ORAL_TABLET | Freq: Every day | ORAL | Status: DC
  Administered 2013-10-18: 750 mg via ORAL
  Filled 2013-10-18 (×2): qty 1

## 2013-10-18 MED ORDER — TRIAMTERENE-HCTZ 37.5-25 MG PO TABS
1.0000 | ORAL_TABLET | Freq: Every day | ORAL | Status: DC
  Administered 2013-10-18 – 2013-10-19 (×2): 1 via ORAL
  Filled 2013-10-18 (×2): qty 1

## 2013-10-18 MED ORDER — SODIUM CHLORIDE 0.9 % IJ SOLN
3.0000 mL | Freq: Two times a day (BID) | INTRAMUSCULAR | Status: DC
  Administered 2013-10-18: 3 mL via INTRAVENOUS

## 2013-10-18 MED ORDER — ONDANSETRON HCL 4 MG PO TABS
4.0000 mg | ORAL_TABLET | Freq: Four times a day (QID) | ORAL | Status: DC | PRN
  Administered 2013-10-18: 4 mg via ORAL
  Filled 2013-10-18: qty 1

## 2013-10-18 NOTE — ED Provider Notes (Signed)
CSN: [id number]     Arrival date & time 10/17/13  1931 History   First MD Initiated Contact with Patient 10/18/13 0036     Chief Complaint  Patient presents with  . Chest Pain  . Shortness of Breath     (Consider location/radiation/quality/duration/timing/severity/associated sxs/prior Treatment) Patient is a 43 y.o. female presenting with chest pain and shortness of breath. The history is provided by the patient.  Chest Pain Pain location:  Substernal area Pain quality: tightness   Pain radiates to:  Does not radiate Pain radiates to the back: no   Pain severity:  Moderate Onset quality:  Gradual Timing:  Constant Progression:  Worsening Chronicity:  New Context: at rest   Relieved by:  Nothing Worsened by:  Nothing tried Associated symptoms: shortness of breath   Associated symptoms: no abdominal pain, no cough, no fever and not vomiting   Shortness of Breath Associated symptoms: chest pain   Associated symptoms: no abdominal pain, no cough, no fever and no vomiting     Past Medical History  Diagnosis Date  . Hypertension   . Blood transfusion without reported diagnosis    Past Surgical History  Procedure Laterality Date  . Endometrial ablation     Family History  Problem Relation Age of Onset  . Hypertension Mother   . Hypertension Father   . Diabetes Sister   . Hypertension Sister   . Hypertension Brother   . Hypertension Son   . Cancer Paternal Aunt   . Diabetes Maternal Grandmother   . Hypertension Maternal Grandmother   . Diabetes Paternal Grandmother   . Hypertension Paternal Grandmother   . Diabetes Sister    History  Substance Use Topics  . Smoking status: Never Smoker   . Smokeless tobacco: Not on file  . Alcohol Use: 6.0 oz/week    5 Cans of beer, 5 Shots of liquor per week     Comment: just on weekends   OB History   Grav Para Term Preterm Abortions TAB SAB Ect Mult Living                 Review of Systems  Constitutional: Negative for  fever and chills.  Respiratory: Positive for shortness of breath. Negative for cough.   Cardiovascular: Positive for chest pain.  Gastrointestinal: Negative for vomiting and abdominal pain.  All other systems reviewed and are negative.     Allergies  Review of patient's allergies indicates no known allergies.  Home Medications   Prior to Admission medications   Medication Sig Start Date End Date Taking? Authorizing Ruari Duggan  amLODipine (NORVASC) 10 MG tablet Take 1 tablet (10 mg total) by mouth daily. PATIENT NEEDS OFFICE VISIT FOR ADDITIONAL REFILLS 05/18/13  Yes Shawnee Knapp, MD  atenolol (TENORMIN) 100 MG tablet Take 1 tablet (100 mg total) by mouth daily. PATIENT NEEDS OFFICE VISIT FOR ADDITIONAL REFILLS 05/18/13  Yes Shawnee Knapp, MD  lisinopril (PRINIVIL,ZESTRIL) 40 MG tablet Take 1 tablet (40 mg total) by mouth daily. PATIENT NEEDS OFFICE VISIT FOR ADDITIONAL REFILLS 05/18/13  Yes Shawnee Knapp, MD  potassium chloride (K-DUR,KLOR-CON) 10 MEQ tablet Take 1 tablet (10 mEq total) by mouth daily. 05/18/13  Yes Shawnee Knapp, MD  triamterene-hydrochlorothiazide (MAXZIDE-25) 37.5-25 MG per tablet Take 1 tablet by mouth daily. PATIENT NEEDS OFFICE VISIT FOR ADDITIONAL REFILLS 05/18/13  Yes Shawnee Knapp, MD   BP 174/112  Pulse 69  Temp(Src) 98 F (36.7 C) (Oral)  Resp 20  Ht 5'  2" (1.575 m)  Wt 182 lb 14.4 oz (82.963 kg)  BMI 33.44 kg/m2  SpO2 94% Physical Exam  Nursing note and vitals reviewed. Constitutional: She is oriented to person, place, and time. She appears well-developed and well-nourished. No distress.  HENT:  Head: Normocephalic and atraumatic.  Eyes: EOM are normal. Pupils are equal, round, and reactive to light.  Neck: Normal range of motion. Neck supple.  Cardiovascular: Normal rate and regular rhythm.  Exam reveals no friction rub.   No murmur heard. Pulmonary/Chest: Tachypnea noted. No respiratory distress. She has decreased breath sounds (diffusely). She has wheezes (mild,  diffuse). She has no rales.  Abdominal: Soft. She exhibits no distension. There is no tenderness. There is no rebound.  Musculoskeletal: Normal range of motion. She exhibits no edema.  Neurological: She is alert and oriented to person, place, and time.  Skin: She is not diaphoretic.    ED Course  Procedures (including critical care time) Labs Review Labs Reviewed  CBC WITH DIFFERENTIAL - Abnormal; Notable for the following:    WBC 14.8 (*)    Neutro Abs 11.3 (*)    Monocytes Absolute 1.3 (*)    All other components within normal limits  COMPREHENSIVE METABOLIC PANEL - Abnormal; Notable for the following:    Potassium 3.6 (*)    Chloride 94 (*)    Total Bilirubin 1.4 (*)    All other components within normal limits  PRO B NATRIURETIC PEPTIDE - Abnormal; Notable for the following:    Pro B Natriuretic peptide (BNP) 708.6 (*)    All other components within normal limits  TROPONIN I  TROPONIN I  TROPONIN I  TSH  I-STAT TROPOININ, ED    Imaging Review Dg Chest 2 View  10/17/2013   CLINICAL DATA:  Chest pain.  Shortness of breath.  EXAM: CHEST  2 VIEW  COMPARISON:  11/05/2011  FINDINGS: Cardiothoracic index 53% on the PA view. No edema. The lungs appear clear. No pleural effusion.  Mild thoracic spondylosis.  IMPRESSION: 1. Mildly enlarged cardiopericardial silhouette. No edema. Otherwise negative.   Electronically Signed   By: Sherryl Barters M.D.   On: 10/17/2013 20:34     EKG Interpretation   Date/Time:  Monday October 17 2013 19:39:57 EDT Ventricular Rate:  90 PR Interval:  164 QRS Duration: 94 QT Interval:  358 QTC Calculation: 437 R Axis:   42 Text Interpretation:  Normal sinus rhythm Possible Left atrial enlargement  Borderline ECG Simlar to prior Confirmed by Mingo Amber  MD, [place] ([id number]) on  10/18/2013 12:37:17 AM      MDM   Final diagnoses:  Shortness of breath  CHF exacerbation    43 year old female comes in with a few days of shortness of breath and chest  tightness. This is worsening. 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 Denies any history of asthma, smoking. Having a cough, but is not productive. Here vitals are stable except for elevated blood pressures. Initial blood pressure 215/140. Lungs with mild diffuse wheezing and decreased air movement throughout. Labs show leukocytosis and BNP elevation. She has no history of heart failure. Patient feeling better after doing meds, but with her BNP elevation concern for CHF. The hypertension reflects CHF also. Given nitroglycerin and put on nitro paste to help with her blood pressure with resolution of her Mr. breath. Admitted to medicine. Lasix given.    Osvaldo Shipper, MD 10/18/13 [phone]

## 2013-10-18 NOTE — Care Management Note (Signed)
    Page 1 of 1   10/18/2013     3:20:51 PM CARE MANAGEMENT NOTE 10/18/2013  Patient:  [name], [name]   Account Number:  [id number]  Date Initiated:  10/18/2013  Documentation initiated by:  [facility]  Subjective/Objective Assessment:   43 y.o. female  in c/o cough and SOB.  She has a history of HTN but does not take her BP medications regularly. Suspected new CHF  //Home with young children     Action/Plan:   Abx and nebs; //Acess for [facility] needs   Anticipated DC Date:  10/20/2013   Anticipated DC Plan:  [place]  CM consult      Choice offered to / List presented to:             Status of service:  In process, will continue to follow Medicare Important Message given?   (If response is "NO", the following Medicare IM given date fields will be blank) Date Medicare IM given:   Date Additional Medicare IM given:    Discharge Disposition:    Per UR Regulation:  Reviewed for med. necessity/level of care/duration of stay  If discussed at [place] of Stay Meetings, dates discussed:    Comments:

## 2013-10-18 NOTE — Progress Notes (Signed)
Patient transferred to room 3E11 via stretcher from the ED. Educated patient to heart failure floor and Heart Failure Folder and handouts given to patient for further information as a new onset CHF patient. Patient understood and agreed to watch videos during day shift. Oriented patient to the room and call bell system. At this time pt complains of no pain. Will continue to monitor to end of shift.

## 2013-10-18 NOTE — Progress Notes (Addendum)
Heart Failure Navigator Consult Note  Presentation: [name] comes in c/o cough and SOB. She has a history of HTN but does not take her BP medications regularly. Her BP at home is normally 170s-180s. She was seen by Dr. Johnsie Cancel for similar c/o chest pain/HTN in 2014 and echo showed moderate LVH. There was focal basal hypertrophy. Systolic function was normal.  She denies orthopnea and does admit to congestion and "cold" type symptoms.  In the ER, they suspected new CHF and gave lasix with no improvement. Her BP was elevated in the ER to 200s/100s. She is currently chest pain free.   Past Medical History  Diagnosis Date  . Hypertension   . Blood transfusion without reported diagnosis     History   Social History  . Marital Status: Single    Spouse Name: N/A    Number of Children: N/A  . Years of Education: N/A   Social History Main Topics  . Smoking status: Never Smoker   . Smokeless tobacco: None  . Alcohol Use: 6.0 oz/week    5 Cans of beer, 5 Shots of liquor per week     Comment: just on weekends  . Drug Use: No  . Sexual Activity: None   Other Topics Concern  . None   Social History Narrative  . None    ECHO: pending  BNP    Component Value Date/Time   PROBNP 708.6* 10/17/2013 1949    Education Assessment and Provision:  Detailed education and instructions provided on heart failure disease management including the following:  Signs and symptoms of Heart Failure When to call the physician Importance of daily weights Low sodium diet Fluid restriction Medication management Anticipated future follow-up appointments  Patient education given on each of the above topics.  Patient acknowledges understanding and acceptance of all instructions.  [name] was very receptive and open to HF education.  She lives home with children and works full-time--recently 58 hours per week.  She admits to not always taking her medications--she says she was on "5 meds and  that's too many".  She also admits to eating out often and loves [place] food.  We discussed the importance of limiting sodium in her diet.  I plan to see her again after echo resulted to reinforce education.  Physician reevaluated old echo and determined patient's symptoms related to bronchitis.  She was very appreciative of information and plans to make changes to affect her BP anyway.  Education Materials:  "Living Better With Heart Failure" Booklet, Daily Weight Tracker Tool  High Risk Criteria for Readmission and/or Poor Patient Outcomes:   EF <30%-pending--Physician reevaluated old echo and determined patient's symptoms related to bronchitis  2 or more admissions in 6 months- No  Difficult social situation- No  Demonstrates medication noncompliance- Yes    Barriers of Care:  Knowledge of HF, compliance  Discharge Planning:   Plans to discharge to home with children

## 2013-10-18 NOTE — H&P (Signed)
[facility] History and Physical  [name] NFA:[id number] DOB: 07-12-1970 DOA: 10/18/2013  Referring physician: er PCP: Delman Cheadle, MD   Chief Complaint: er  HPI: [name] is a 43 y.o. female  Who comes in c/o cough and SOB.  She has a history of HTN but does not take her BP medications regularly.  Her BP at home is normally 170s-180s.  She was seen by Dr. Johnsie Cancel for similar c/o chest pain/HTN in 2014 and echo showed moderate LVH. There was focal basal hypertrophy. Systolic function was normal. She denies orthopnea and does admit to congestion and "cold" type symptoms.  In the ER, they suspected new CHF and gave lasix with no improvement.  Her BP was elevated in the ER to 200s/100s.  She is currently chest pain free.     Review of Systems:  All systems reviewed, negative unless stated above   Past Medical History  Diagnosis Date  . Hypertension   . Blood transfusion without reported diagnosis    Past Surgical History  Procedure Laterality Date  . Endometrial ablation     Social History:  reports that she has never smoked. She does not have any smokeless tobacco history on file. She reports that she drinks about 6 ounces of alcohol per week. She reports that she does not use illicit drugs.  No Known Allergies  Family History  Problem Relation Age of Onset  . Hypertension Mother   . Hypertension Father   . Diabetes Sister   . Hypertension Sister   . Hypertension Brother   . Hypertension Son   . Cancer Paternal Aunt   . Diabetes Maternal Grandmother   . Hypertension Maternal Grandmother   . Diabetes Paternal Grandmother   . Hypertension Paternal Grandmother   . Diabetes Sister      Prior to Admission medications   Medication Sig Start Date End Date Taking? Authorizing Provider  amLODipine (NORVASC) 10 MG tablet Take 1 tablet (10 mg total) by mouth daily. PATIENT NEEDS OFFICE VISIT FOR ADDITIONAL REFILLS 05/18/13  Yes Shawnee Knapp, MD  atenolol (TENORMIN)  100 MG tablet Take 1 tablet (100 mg total) by mouth daily. PATIENT NEEDS OFFICE VISIT FOR ADDITIONAL REFILLS 05/18/13  Yes Shawnee Knapp, MD  lisinopril (PRINIVIL,ZESTRIL) 40 MG tablet Take 1 tablet (40 mg total) by mouth daily. PATIENT NEEDS OFFICE VISIT FOR ADDITIONAL REFILLS 05/18/13  Yes Shawnee Knapp, MD  potassium chloride (K-DUR,KLOR-CON) 10 MEQ tablet Take 1 tablet (10 mEq total) by mouth daily. 05/18/13  Yes Shawnee Knapp, MD  triamterene-hydrochlorothiazide (MAXZIDE-25) 37.5-25 MG per tablet Take 1 tablet by mouth daily. PATIENT NEEDS OFFICE VISIT FOR ADDITIONAL REFILLS 05/18/13  Yes Shawnee Knapp, MD   Physical Exam: Filed Vitals:   10/18/13 0436  BP: 174/112  Pulse: 69  Temp: 98 F (36.7 C)  Resp: 20    BP 174/112  Pulse 69  Temp(Src) 98 F (36.7 C) (Oral)  Resp 20  Ht 5\' 2"  (1.575 m)  Wt 82.963 kg (182 lb 14.4 oz)  BMI 33.44 kg/m2  SpO2 94%  General:  Appears calm and comfortable Eyes: PERRL, normal lids, irises & conjunctiva ENT: grossly normal hearing, lips & tongue Neck: no LAD, masses or thyromegaly Cardiovascular: RRR, no m/r/g. No LE edema. Respiratory: CTA bilaterally, no w/r/r. Normal respiratory effort. Abdomen: soft, ntnd Skin: no rash or induration seen on limited exam Musculoskeletal: grossly normal tone BUE/BLE Psychiatric: grossly normal mood and affect, speech fluent and appropriate Neurologic: grossly  non-focal.          Labs on Admission:  Basic Metabolic Panel:  Recent Labs Lab 10/17/13 1949  NA 137  K 3.6*  CL 94*  CO2 25  GLUCOSE 99  BUN 6  CREATININE 0.71  CALCIUM 10.0   Liver Function Tests:  Recent Labs Lab 10/17/13 1949  AST 24  ALT 22  ALKPHOS 78  BILITOT 1.4*  PROT 8.3  ALBUMIN 4.5   No results found for this basename: LIPASE, AMYLASE,  in the last 168 hours No results found for this basename: AMMONIA,  in the last 168 hours CBC:  Recent Labs Lab 10/17/13 1949  WBC 14.8*  NEUTROABS 11.3*  HGB 14.2  HCT 42.3  MCV 90.4    PLT 236   Cardiac Enzymes: No results found for this basename: CKTOTAL, CKMB, CKMBINDEX, TROPONINI,  in the last 168 hours  BNP (last 3 results)  Recent Labs  10/17/13 1949  PROBNP 708.6*   CBG: No results found for this basename: GLUCAP,  in the last 168 hours  Radiological Exams on Admission: Dg Chest 2 View  10/17/2013   CLINICAL DATA:  Chest pain.  Shortness of breath.  EXAM: CHEST  2 VIEW  COMPARISON:  11/05/2011  FINDINGS: Cardiothoracic index 53% on the PA view. No edema. The lungs appear clear. No pleural effusion.  Mild thoracic spondylosis.  IMPRESSION: 1. Mildly enlarged cardiopericardial silhouette. No edema. Otherwise negative.   Electronically Signed   By: Sherryl Barters M.D.   On: 10/17/2013 20:34    EKG: Independently reviewed. NSR, LAE  Assessment/Plan Principal Problem:   SOB (shortness of breath) Active Problems:   HYPERTENSION   Cardiomegaly   Chest pain   SOB- suspect bronchitis- elevated WBC count, no orthopnea, no edema worse then baseline, abx and nebs -no fluid on x ray, BNP only mildly elevated  Leukocytosis- see above, abx and trend  HTN- accelerated- patient has not been taking medications at home, so will resume home meds with IV prn and adjust based on results  Chest pain- tele, cycle CE, could be related to bronchitis vs uncontrolled HTN, echo- last echo was 5/14  Hypokalemia- replete  Binge alcohol use- drinks heavily on the weekends   Code Status: full Family Communication: patient Disposition Plan: admit  Time spent: 75 min  Eulogio Bear [facility] Pager [phone]  **Disclaimer: This note may have been dictated with voice recognition software. Similar sounding words can inadvertently be transcribed and this note may contain transcription errors which may not have been corrected upon publication of note.**

## 2013-10-19 DIAGNOSIS — I10 Essential (primary) hypertension: Secondary | ICD-10-CM

## 2013-10-19 DIAGNOSIS — J209 Acute bronchitis, unspecified: Principal | ICD-10-CM

## 2013-10-19 LAB — CBC
HCT: 44.9 % (ref 36.0–46.0)
HEMOGLOBIN: 14.7 g/dL (ref 12.0–15.0)
MCH: 30.4 pg (ref 26.0–34.0)
MCHC: 32.7 g/dL (ref 30.0–36.0)
MCV: 92.8 fL (ref 78.0–100.0)
Platelets: 207 10*3/uL (ref 150–400)
RBC: 4.84 MIL/uL (ref 3.87–5.11)
RDW: 13.3 % (ref 11.5–15.5)
WBC: 12.4 10*3/uL — ABNORMAL HIGH (ref 4.0–10.5)

## 2013-10-19 LAB — BASIC METABOLIC PANEL
BUN: 16 mg/dL (ref 6–23)
CALCIUM: 9.9 mg/dL (ref 8.4–10.5)
CO2: 25 mEq/L (ref 19–32)
Chloride: 98 mEq/L (ref 96–112)
Creatinine, Ser: 0.94 mg/dL (ref 0.50–1.10)
GFR calc non Af Amer: 73 mL/min — ABNORMAL LOW (ref >90)
GFR, EST AFRICAN AMERICAN: 84 mL/min — AB (ref >90)
GLUCOSE: 99 mg/dL (ref 70–99)
POTASSIUM: 4.1 meq/L (ref 3.7–5.3)
Sodium: 138 mEq/L (ref 137–147)

## 2013-10-19 MED ORDER — LEVOFLOXACIN 500 MG PO TABS
500.0000 mg | ORAL_TABLET | Freq: Every day | ORAL | Status: DC
  Administered 2013-10-19: 500 mg via ORAL
  Filled 2013-10-19: qty 1

## 2013-10-19 MED ORDER — LEVOFLOXACIN 500 MG PO TABS: 500.0000 mg | ORAL_TABLET | Freq: Every day | ORAL | Status: DC

## 2013-10-19 NOTE — Progress Notes (Signed)
Patient given discharge instructions and all questions answered.  Pt. Discharged via wheelchair with all belongings.   

## 2013-10-19 NOTE — Progress Notes (Signed)
Patient oxygen saturation while walking on room air between 94-95%

## 2013-10-19 NOTE — Discharge Summary (Signed)
Physician Discharge Summary  [name] VHQ:[id number] DOB: 09-20-1970 DOA: 10/18/2013  PCP: Delman Cheadle, MD  Admit date: 10/18/2013 Discharge date: 10/19/2013  Time spent: 35 minutes  Recommendations for Outpatient Follow-up:  1. Follow up with PCP in 2 week.  Discharge Diagnoses:  Principal Problem:   Acute bronchitis Active Problems:   HYPERTENSION   Cardiomegaly   SOB (shortness of breath)   Chest pain   Discharge Condition: stable  Diet recommendation: regular  Filed Weights   10/17/13 1943 10/18/13 0506 10/19/13 0556  Weight: 84.823 kg (187 lb) 82.963 kg (182 lb 14.4 oz) 82.328 kg (181 lb 8 oz)    History of present illness:  43 y.o. female  Who comes in c/o cough and SOB. She has a history of HTN but does not take her BP medications regularly. Her BP at home is normally 170s-180s. She was seen by Dr. Johnsie Cancel for similar c/o chest pain/HTN in 2014 and echo showed moderate LVH. There was focal basal hypertrophy. Systolic function was normal.  She denies orthopnea and does admit to congestion and "cold" type symptoms.      Hospital Course:  SOB due to Acute bronchitis: - suspect bronchitis- elevated WBC count, no orthopnea, no edema worse then baseline, abx and nebs  - no fluid on x ray, BNP only mildly elevated. - remained afebrile, cont antibiotics for 5 additional days.  HTN- accelerated - patient has not been taking medications at home, - resumed all meds. - improved on Dc.  Chest pain: - cycle CE negative. - due to SOB and non compliance. - echo- last echo was 5/14   Procedures:  CXR  Consultations:  none  Discharge Exam: Filed Vitals:   10/19/13 0556  BP: 136/95  Pulse: 57  Temp: 97.6 F (36.4 C)  Resp: 18    General: A&O x3 Cardiovascular: RRR Respiratory: good air movement CTA B/L  Discharge Instructions You were cared for by a hospitalist during your hospital stay. If you have any questions about your discharge medications or the  care you received while you were in the hospital after you are discharged, you can call the unit and asked to speak with the hospitalist on call if the hospitalist that took care of you is not available. Once you are discharged, your primary care physician will handle any further medical issues. Please note that NO REFILLS for any discharge medications will be authorized once you are discharged, as it is imperative that you return to your primary care physician (or establish a relationship with a primary care physician if you do not have one) for your aftercare needs so that they can reassess your need for medications and monitor your lab values.      Discharge Instructions   Diet - low sodium heart healthy    Complete by:  As directed      Increase activity slowly    Complete by:  As directed             Medication List         amLODipine 10 MG tablet  Commonly known as:  NORVASC  Take 1 tablet (10 mg total) by mouth daily. PATIENT NEEDS OFFICE VISIT FOR ADDITIONAL REFILLS     atenolol 100 MG tablet  Commonly known as:  TENORMIN  Take 1 tablet (100 mg total) by mouth daily. PATIENT NEEDS OFFICE VISIT FOR ADDITIONAL REFILLS     levofloxacin 500 MG tablet  Commonly known as:  LEVAQUIN  Take 1 tablet (  500 mg total) by mouth daily.     lisinopril 40 MG tablet  Commonly known as:  PRINIVIL,ZESTRIL  Take 1 tablet (40 mg total) by mouth daily. PATIENT NEEDS OFFICE VISIT FOR ADDITIONAL REFILLS     potassium chloride 10 MEQ tablet  Commonly known as:  K-DUR,KLOR-CON  Take 1 tablet (10 mEq total) by mouth daily.     triamterene-hydrochlorothiazide 37.5-25 MG per tablet  Commonly known as:  MAXZIDE-25  Take 1 tablet by mouth daily. PATIENT NEEDS OFFICE VISIT FOR ADDITIONAL REFILLS       No Known Allergies Follow-up Information   Follow up with Delman Cheadle, MD. (Appointments by walk in only )    Specialty:  Family Medicine   Contact information:   [place]  [address] [phone]        The results of significant diagnostics from this hospitalization (including imaging, microbiology, ancillary and laboratory) are listed below for reference.    Significant Diagnostic Studies: Dg Chest 2 View  10/17/2013   CLINICAL DATA:  Chest pain.  Shortness of breath.  EXAM: CHEST  2 VIEW  COMPARISON:  11/05/2011  FINDINGS: Cardiothoracic index 53% on the PA view. No edema. The lungs appear clear. No pleural effusion.  Mild thoracic spondylosis.  IMPRESSION: 1. Mildly enlarged cardiopericardial silhouette. No edema. Otherwise negative.   Electronically Signed   By: Sherryl Barters M.D.   On: 10/17/2013 20:34    Microbiology: No results found for this or any previous visit (from the past 240 hour(s)).   Labs: Basic Metabolic Panel:  Recent Labs Lab 10/17/13 1949 10/19/13 0329  NA 137 138  K 3.6* 4.1  CL 94* 98  CO2 25 25  GLUCOSE 99 99  BUN 6 16  CREATININE 0.71 0.94  CALCIUM 10.0 9.9   Liver Function Tests:  Recent Labs Lab 10/17/13 1949  AST 24  ALT 22  ALKPHOS 78  BILITOT 1.4*  PROT 8.3  ALBUMIN 4.5   No results found for this basename: LIPASE, AMYLASE,  in the last 168 hours No results found for this basename: AMMONIA,  in the last 168 hours CBC:  Recent Labs Lab 10/17/13 1949 10/19/13 0329  WBC 14.8* 12.4*  NEUTROABS 11.3*  --   HGB 14.2 14.7  HCT 42.3 44.9  MCV 90.4 92.8  PLT 236 207   Cardiac Enzymes:  Recent Labs Lab 10/18/13 0918 10/18/13 1327 10/18/13 2000  TROPONINI <0.30 <0.30 <0.30   BNP: BNP (last 3 results)  Recent Labs  10/17/13 1949  PROBNP 708.6*   CBG: No results found for this basename: GLUCAP,  in the last 168 hours   Signed:  Charlynne Cousins  [facility] 10/19/2013, 8:58 AM

## 2013-10-19 NOTE — Discharge Instructions (Signed)
[name] was admitted to the Hospital on 10/18/2013 and Discharged on Discharge Date 10/19/2013 and should be excused from work/school   for  2   days starting 10/18/2013 , may return to work/school without any restrictions.  Call Bess Harvest MD, [place] [facility] [phone] with questions.  Charlynne Cousins M.D on 10/19/2013,at 9:05 AM  [facility] Office  [phone]

## 2014-02-10 ENCOUNTER — Other Ambulatory Visit: Payer: Self-pay

## 2014-02-24 ENCOUNTER — Encounter: Payer: Self-pay | Admitting: Family Medicine

## 2014-02-24 ENCOUNTER — Ambulatory Visit (INDEPENDENT_AMBULATORY_CARE_PROVIDER_SITE_OTHER): Payer: Managed Care, Other (non HMO) | Admitting: Family Medicine

## 2014-02-24 VITALS — BP 188/120 | HR 86 | Temp 98.6°F | Resp 16 | Ht 62.25 in | Wt 184.4 lb

## 2014-02-24 DIAGNOSIS — I10 Essential (primary) hypertension: Secondary | ICD-10-CM

## 2014-02-24 DIAGNOSIS — Z23 Encounter for immunization: Secondary | ICD-10-CM

## 2014-02-24 DIAGNOSIS — Z789 Other specified health status: Secondary | ICD-10-CM | POA: Insufficient documentation

## 2014-02-24 DIAGNOSIS — Z124 Encounter for screening for malignant neoplasm of cervix: Secondary | ICD-10-CM

## 2014-02-24 DIAGNOSIS — Z13220 Encounter for screening for lipoid disorders: Secondary | ICD-10-CM

## 2014-02-24 DIAGNOSIS — Z1329 Encounter for screening for other suspected endocrine disorder: Secondary | ICD-10-CM

## 2014-02-24 DIAGNOSIS — Z113 Encounter for screening for infections with a predominantly sexual mode of transmission: Secondary | ICD-10-CM

## 2014-02-24 DIAGNOSIS — Z130 Encounter for screening for diseases of the blood and blood-forming organs and certain disorders involving the immune mechanism: Secondary | ICD-10-CM

## 2014-02-24 DIAGNOSIS — Z0000 Encounter for general adult medical examination without abnormal findings: Secondary | ICD-10-CM

## 2014-02-24 LAB — COMPREHENSIVE METABOLIC PANEL
ALT: 24 U/L (ref 0–35)
AST: 24 U/L (ref 0–37)
Albumin: 4.6 g/dL (ref 3.5–5.2)
Alkaline Phosphatase: 70 U/L (ref 39–117)
BILIRUBIN TOTAL: 1.5 mg/dL — AB (ref 0.2–1.2)
BUN: 8 mg/dL (ref 6–23)
CALCIUM: 9.6 mg/dL (ref 8.4–10.5)
CHLORIDE: 103 meq/L (ref 96–112)
CO2: 28 mEq/L (ref 19–32)
Creat: 0.67 mg/dL (ref 0.50–1.10)
Glucose, Bld: 93 mg/dL (ref 70–99)
Potassium: 3.9 mEq/L (ref 3.5–5.3)
SODIUM: 140 meq/L (ref 135–145)
TOTAL PROTEIN: 7.6 g/dL (ref 6.0–8.3)

## 2014-02-24 LAB — CBC
HCT: 42.9 % (ref 36.0–46.0)
Hemoglobin: 14.6 g/dL (ref 12.0–15.0)
MCH: 29.9 pg (ref 26.0–34.0)
MCHC: 34 g/dL (ref 30.0–36.0)
MCV: 87.9 fL (ref 78.0–100.0)
PLATELETS: 226 10*3/uL (ref 150–400)
RBC: 4.88 MIL/uL (ref 3.87–5.11)
RDW: 13.5 % (ref 11.5–15.5)
WBC: 5.5 10*3/uL (ref 4.0–10.5)

## 2014-02-24 LAB — POCT URINE PREGNANCY: Preg Test, Ur: NEGATIVE

## 2014-02-24 LAB — TSH: TSH: 0.861 u[IU]/mL (ref 0.350–4.500)

## 2014-02-24 LAB — RPR

## 2014-02-24 LAB — HEPATITIS B SURFACE ANTIGEN: HEP B S AG: NEGATIVE

## 2014-02-24 LAB — LIPID PANEL
CHOLESTEROL: 171 mg/dL (ref 0–200)
HDL: 50 mg/dL (ref >39)
LDL Cholesterol: 110 mg/dL — ABNORMAL HIGH (ref 0–99)
TRIGLYCERIDES: 56 mg/dL (ref <150)
Total CHOL/HDL Ratio: 3.4 Ratio
VLDL: 11 mg/dL (ref 0–40)

## 2014-02-24 LAB — HEPATITIS B SURFACE ANTIBODY, QUANTITATIVE: HEPATITIS B-POST: 11.5 m[IU]/mL

## 2014-02-24 LAB — HIV ANTIBODY (ROUTINE TESTING W REFLEX): HIV 1&2 Ab, 4th Generation: NONREACTIVE

## 2014-02-24 LAB — HEPATITIS C ANTIBODY: HCV AB: NEGATIVE

## 2014-02-24 MED ORDER — TRIAMTERENE-HCTZ 37.5-25 MG PO TABS: 1.0000 | ORAL_TABLET | Freq: Every day | ORAL | Status: DC

## 2014-02-24 MED ORDER — LISINOPRIL 40 MG PO TABS: 40.0000 mg | ORAL_TABLET | Freq: Every day | ORAL | Status: DC

## 2014-02-24 NOTE — Patient Instructions (Addendum)
Good to see you today!  I will be in touch with your labs asap.  We need to work on your blood pressure- we will have you get back on your medications gradually.  Please start back on the maxzide (triamterene/ hctz) first, and then we can add back the lisinopril (1/2 tablet) after a 3-4 days.  If your BP is still higher than 150/90 go to a whole lisinopril pill.    Please check your BP every 1-2 days and keep a log.  Please update me over mychart with your BP every few days.

## 2014-02-24 NOTE — Progress Notes (Signed)
[facility] [address] [place] [address] [phone]  Date:  02/24/2014   Name:  [name]   DOB:  Feb 28, 1971   MRN:  [id number]  PCP:  Delman Cheadle, MD    Chief Complaint: Annual Exam   History of Present Illness:  [name] is a 43 y.o. very pleasant female patient who presents with the following:  Here today for a CPE; last seen here over a year ago.  History of HTN- she has been out of her medications for 2 months.  She was hospitalized with CP in June- she was released on #5 BP agents but when the ran out she did not come back in.  She had a HA last week, but this is now better.   She had a pap last year- it looked ok.   She had a mammogram in January that looked good.  She is not sure about her last tetanus shot She is fasting today for labs  Patient Active Problem List   Diagnosis Date Noted  . Acute bronchitis 10/19/2013  . CHF exacerbation 10/18/2013  . SOB (shortness of breath) 10/18/2013  . Chest pain 10/18/2013  . Cardiomegaly 08/23/2012  . Fibroid, uterine 03/17/2012  . HEADACHE, TENSION 02/02/2007  . HYPERTENSION 02/02/2007    Past Medical History  Diagnosis Date  . Hypertension   . Blood transfusion without reported diagnosis     Past Surgical History  Procedure Laterality Date  . Endometrial ablation      History  Substance Use Topics  . Smoking status: Never Smoker   . Smokeless tobacco: Not on file  . Alcohol Use: 6.0 oz/week    5 Cans of beer, 5 Shots of liquor per week     Comment: just on weekends    Family History  Problem Relation Age of Onset  . Hypertension Mother   . Hypertension Father   . Diabetes Sister   . Hypertension Sister   . Hypertension Brother   . Hypertension Son   . Cancer Paternal Aunt   . Diabetes Maternal Grandmother   . Hypertension Maternal Grandmother   . Diabetes Paternal Grandmother   . Hypertension Paternal Grandmother   . Diabetes Sister     No Known  Allergies  Medication list has been reviewed and updated.  Current Outpatient Prescriptions on File Prior to Visit  Medication Sig Dispense Refill  . amLODipine (NORVASC) 10 MG tablet Take 1 tablet (10 mg total) by mouth daily. PATIENT NEEDS OFFICE VISIT FOR ADDITIONAL REFILLS  30 tablet  0  . atenolol (TENORMIN) 100 MG tablet Take 1 tablet (100 mg total) by mouth daily. PATIENT NEEDS OFFICE VISIT FOR ADDITIONAL REFILLS  30 tablet  0  . levofloxacin (LEVAQUIN) 500 MG tablet Take 1 tablet (500 mg total) by mouth daily.  5 tablet  0  . lisinopril (PRINIVIL,ZESTRIL) 40 MG tablet Take 1 tablet (40 mg total) by mouth daily. PATIENT NEEDS OFFICE VISIT FOR ADDITIONAL REFILLS  30 tablet  0  . potassium chloride (K-DUR,KLOR-CON) 10 MEQ tablet Take 1 tablet (10 mEq total) by mouth daily.  90 tablet  0  . triamterene-hydrochlorothiazide (MAXZIDE-25) 37.5-25 MG per tablet Take 1 tablet by mouth daily. PATIENT NEEDS OFFICE VISIT FOR ADDITIONAL REFILLS  30 tablet  0   No current facility-administered medications on file prior to visit.    Review of Systems:  As per HPI- otherwise negative.   Physical Examination: Filed Vitals:   02/24/14 9381  BP: 188/120  Pulse: 86  Temp: 98.6 F (37 C)  Resp: 16   Filed Vitals:   02/24/14 0842  Height: 5' 2.25" (1.581 m)  Weight: 184 lb 6.4 oz (83.643 kg)   Body mass index is 33.46 kg/(m^2). Ideal Body Weight: Weight in (lb) to have BMI = 25: 137.5  GEN: WDWN, NAD, Non-toxic, A & O x 3, obese, looks well HEENT: Atraumatic, Normocephalic. Neck supple. No masses, No LAD.  Bilateral TM wnl, oropharynx normal.  PEERL,EOMI.   Ears and Nose: No external deformity. CV: RRR, No M/G/R. No JVD. No thrill. No extra heart sounds. PULM: CTA B, no wheezes, crackles, rhonchi. No retractions. No resp. distress. No accessory muscle use. ABD: S, NT, ND. No rebound. No HSM. EXTR: No c/c/e NEURO Normal gait.  PSYCH: Normally interactive. Conversant. Not depressed or  anxious appearing.  Calm demeanor.  Breast: normal exam, no masses/ dimpling/ discharge Pelvic: normal, no vaginal lesions or discharge. Uterus normal, no CMT, no adnexal tendereness or masses    Assessment and Plan: Physical exam - Plan: POCT urine pregnancy  Immunization due - Plan: Tdap vaccine greater than or equal to 7yo IM  Essential hypertension - Plan: Comprehensive metabolic panel, triamterene-hydrochlorothiazide (MAXZIDE-25) 37.5-25 MG per tablet, lisinopril (PRINIVIL,ZESTRIL) 40 MG tablet  Screening for hyperlipidemia - Plan: Lipid panel  Screening for hypothyroidism - Plan: TSH  Screening for deficiency anemia - Plan: CBC  Screening for cervical cancer - Plan: Pap IG and HPV (high risk) DNA detection  Screening for STD (sexually transmitted disease) - Plan: HIV antibody, Hepatitis B surface antibody, Hepatitis B surface antigen, Hepatitis C antibody, RPR  tdap today, labs and pap as above.   She would like STI testing so will do BW as above.   Uncontrolled HTN- explained to her that she needs to do better with controlling her BP to prevent adverse effects such as MI or stroke.  She will try.  Will put her back onto medication in a step- wise fashion as per pt instructions  Signed Lamar Blinks, MD

## 2014-02-28 LAB — PAP IG AND HPV HIGH-RISK: HPV DNA High Risk: NOT DETECTED

## 2014-03-09 ENCOUNTER — Telehealth: Payer: Self-pay

## 2014-03-09 DIAGNOSIS — I10 Essential (primary) hypertension: Secondary | ICD-10-CM

## 2014-03-09 NOTE — Telephone Encounter (Signed)
PATIENT STATES SHE SAW DR. Lorelei Pont ABOUT 2 WEEKS AGO. SHE GAVE HER BLOOD PRESSURE MEDICATIONS AND TOLD HER TO CALL BACK REGARDING HOW THEY WERE DOING. [name] SAID HER BLOOD PRESSURE HAS GONE DOWN SOME. SHE TOOK IT TODAY AT [facility] AND IT WAS 169/113. INSTEAD OF TAKING 1/2 LISINOPRIL 400 MG, SHE IS TAKING A WHOLE PILL. SHE IS ALSO TAKING HCTZ 37.5/25 MG.  BEST PHONE [phone] (CELL)  [place]

## 2014-03-10 MED ORDER — AMLODIPINE BESYLATE 10 MG PO TABS: ORAL_TABLET | ORAL | Status: DC

## 2014-03-10 NOTE — Telephone Encounter (Signed)
Called and discussed with her.  She will add back 5mg  of amlodipine as well.  I will send this to her drug store and she will continue to keep me up to date as to her BP.  She plans to get a home cuff as right now she has to go to [facility] to check her BP

## 2014-03-10 NOTE — Telephone Encounter (Signed)
Called her back but had to [facility].  Will try again

## 2014-04-24 ENCOUNTER — Telehealth: Payer: Self-pay

## 2014-04-24 DIAGNOSIS — I10 Essential (primary) hypertension: Secondary | ICD-10-CM

## 2014-04-24 NOTE — Telephone Encounter (Signed)
Pt called wanting these three prescriptions sent to [facility] clearing house amLODipine (NORVASC) 10 MG tablet [[id number], atenolol (TENORMIN) 100 MG tablet [[id number]]  And lisinopril (PRINIVIL,ZESTRIL) 40 MG tablet [[id number]. Please advise at [phone]

## 2014-04-25 ENCOUNTER — Emergency Department (HOSPITAL_COMMUNITY): Payer: Managed Care, Other (non HMO)

## 2014-04-25 ENCOUNTER — Encounter (HOSPITAL_COMMUNITY): Payer: Self-pay | Admitting: *Deleted

## 2014-04-25 ENCOUNTER — Emergency Department (HOSPITAL_COMMUNITY)
Admission: EM | Admit: 2014-04-25 | Discharge: 2014-04-25 | Disposition: A | Discharge status: Home / Self Care | Payer: Managed Care, Other (non HMO) | Attending: Emergency Medicine | Admitting: Emergency Medicine

## 2014-04-25 ENCOUNTER — Telehealth: Payer: Self-pay

## 2014-04-25 DIAGNOSIS — H5712 Ocular pain, left eye: Secondary | ICD-10-CM

## 2014-04-25 DIAGNOSIS — S0592XA Unspecified injury of left eye and orbit, initial encounter: Secondary | ICD-10-CM | POA: Diagnosis present

## 2014-04-25 DIAGNOSIS — H1032 Unspecified acute conjunctivitis, left eye: Secondary | ICD-10-CM | POA: Diagnosis not present

## 2014-04-25 DIAGNOSIS — Y9289 Other specified places as the place of occurrence of the external cause: Secondary | ICD-10-CM | POA: Diagnosis not present

## 2014-04-25 DIAGNOSIS — I10 Essential (primary) hypertension: Secondary | ICD-10-CM

## 2014-04-25 DIAGNOSIS — H109 Unspecified conjunctivitis: Secondary | ICD-10-CM

## 2014-04-25 DIAGNOSIS — Z79899 Other long term (current) drug therapy: Secondary | ICD-10-CM | POA: Insufficient documentation

## 2014-04-25 DIAGNOSIS — W51XXXA Accidental striking against or bumped into by another person, initial encounter: Secondary | ICD-10-CM | POA: Diagnosis not present

## 2014-04-25 DIAGNOSIS — Y998 Other external cause status: Secondary | ICD-10-CM | POA: Insufficient documentation

## 2014-04-25 DIAGNOSIS — Y9389 Activity, other specified: Secondary | ICD-10-CM | POA: Insufficient documentation

## 2014-04-25 LAB — I-STAT TROPONIN, ED: Troponin i, poc: 0 ng/mL (ref 0.00–0.08)

## 2014-04-25 LAB — I-STAT CHEM 8, ED
BUN: 19 mg/dL (ref 6–23)
Calcium, Ion: 1.13 mmol/L (ref 1.12–1.23)
Chloride: 99 mEq/L (ref 96–112)
Creatinine, Ser: 0.9 mg/dL (ref 0.50–1.10)
Glucose, Bld: 106 mg/dL — ABNORMAL HIGH (ref 70–99)
HEMATOCRIT: 47 % — AB (ref 36.0–46.0)
HEMOGLOBIN: 16 g/dL — AB (ref 12.0–15.0)
Potassium: 3.8 mmol/L (ref 3.5–5.1)
SODIUM: 138 mmol/L (ref 135–145)
TCO2: 23 mmol/L (ref 0–100)

## 2014-04-25 MED ORDER — IOHEXOL 300 MG/ML  SOLN
80.0000 mL | Freq: Once | INTRAMUSCULAR | Status: AC | PRN
  Administered 2014-04-25: 80 mL via INTRAVENOUS

## 2014-04-25 MED ORDER — LORATADINE 10 MG PO TABS
10.0000 mg | ORAL_TABLET | Freq: Once | ORAL | Status: AC
  Administered 2014-04-25: 10 mg via ORAL
  Filled 2014-04-25 (×2): qty 1

## 2014-04-25 MED ORDER — AMLODIPINE BESYLATE 5 MG PO TABS
10.0000 mg | ORAL_TABLET | Freq: Every day | ORAL | Status: DC
  Administered 2014-04-25: 10 mg via ORAL
  Filled 2014-04-25: qty 2

## 2014-04-25 MED ORDER — LISINOPRIL 40 MG PO TABS: 40.0000 mg | ORAL_TABLET | Freq: Every day | ORAL | Status: DC

## 2014-04-25 MED ORDER — HYDRALAZINE HCL 20 MG/ML IJ SOLN
5.0000 mg | Freq: Once | INTRAMUSCULAR | Status: AC
  Administered 2014-04-25: 5 mg via INTRAVENOUS
  Filled 2014-04-25: qty 1

## 2014-04-25 MED ORDER — LISINOPRIL 20 MG PO TABS
40.0000 mg | ORAL_TABLET | ORAL | Status: AC
  Administered 2014-04-25: 40 mg via ORAL
  Filled 2014-04-25: qty 2

## 2014-04-25 MED ORDER — ATENOLOL 100 MG PO TABS: 100.0000 mg | ORAL_TABLET | Freq: Every day | ORAL | Status: DC

## 2014-04-25 MED ORDER — AMLODIPINE BESYLATE 10 MG PO TABS: 10.0000 mg | ORAL_TABLET | Freq: Every day | ORAL | Status: DC

## 2014-04-25 MED ORDER — ERYTHROMYCIN 5 MG/GM OP OINT: TOPICAL_OINTMENT | OPHTHALMIC | Status: DC

## 2014-04-25 MED ORDER — TRIAMTERENE-HCTZ 37.5-25 MG PO TABS: 1.0000 | ORAL_TABLET | Freq: Every day | ORAL | Status: DC

## 2014-04-25 MED ORDER — TETRACAINE HCL 0.5 % OP SOLN
1.0000 [drp] | Freq: Once | OPHTHALMIC | Status: AC
  Administered 2014-04-25: 1 [drp] via OPHTHALMIC
  Filled 2014-04-25: qty 2

## 2014-04-25 MED ORDER — SODIUM CHLORIDE 0.9 % IV BOLUS (SEPSIS)
500.0000 mL | Freq: Once | INTRAVENOUS | Status: AC
  Administered 2014-04-25: 500 mL via INTRAVENOUS

## 2014-04-25 MED ORDER — FLUORESCEIN SODIUM 1 MG OP STRP
1.0000 | ORAL_STRIP | Freq: Once | OPHTHALMIC | Status: AC
  Administered 2014-04-25: 1 via OPHTHALMIC
  Filled 2014-04-25: qty 1

## 2014-04-25 MED ORDER — TRIAMTERENE-HCTZ 37.5-25 MG PO TABS
1.0000 | ORAL_TABLET | Freq: Every day | ORAL | Status: DC
  Administered 2014-04-25: 1 via ORAL
  Filled 2014-04-25: qty 1

## 2014-04-25 NOTE — Telephone Encounter (Signed)
Patient is missing her script amLODipine (NORVASC) 10 MG tablet   Needs this sent to [facility] for refill.  [phone]

## 2014-04-25 NOTE — ED Notes (Signed)
Patient states she is out of bp meds.  Patient states that she is waiting on mail in order.

## 2014-04-25 NOTE — Discharge Instructions (Signed)
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
## 2014-04-25 NOTE — Telephone Encounter (Signed)
Sent medication. Pt advised.

## 2014-04-25 NOTE — ED Notes (Signed)
Patient is awaiting delivery of her bp medications.  She has been out this week.
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
## 2014-04-26 MED ORDER — AMLODIPINE BESYLATE 10 MG PO TABS: 10.0000 mg | ORAL_TABLET | Freq: Every day | ORAL | Status: DC

## 2014-04-26 NOTE — Telephone Encounter (Signed)
Refill sent per last OV

## 2014-05-01 ENCOUNTER — Telehealth: Payer: Self-pay

## 2014-05-01 NOTE — Telephone Encounter (Signed)
LM for pt to RTC-

## 2014-05-01 NOTE — Telephone Encounter (Signed)
Pt of Dr. Lorelei Pont was in Emergency room for pink eye in both eyes, she would like to know if it would take longer than 7 days to clear up, and if she should come on for on ov with Dr. Lorelei Pont. Please advise

## 2014-09-22 ENCOUNTER — Encounter (HOSPITAL_COMMUNITY): Payer: Self-pay | Admitting: Emergency Medicine

## 2014-09-22 DIAGNOSIS — I10 Essential (primary) hypertension: Secondary | ICD-10-CM | POA: Diagnosis not present

## 2014-09-22 DIAGNOSIS — Z79899 Other long term (current) drug therapy: Secondary | ICD-10-CM | POA: Diagnosis not present

## 2014-09-22 DIAGNOSIS — J069 Acute upper respiratory infection, unspecified: Secondary | ICD-10-CM | POA: Insufficient documentation

## 2014-09-22 DIAGNOSIS — R079 Chest pain, unspecified: Secondary | ICD-10-CM | POA: Diagnosis present

## 2014-09-22 NOTE — ED Notes (Signed)
C/o sore throat and productive cough with green sputum x 3 days.  Reports burning pain in center of chest with sob and nausea since yesterday.  States pain initially started under L breast 2 days ago but then moved to center of chest.

## 2014-09-23 ENCOUNTER — Encounter (HOSPITAL_COMMUNITY): Payer: Self-pay | Admitting: Emergency Medicine

## 2014-09-23 ENCOUNTER — Emergency Department (HOSPITAL_COMMUNITY)
Admission: EM | Admit: 2014-09-23 | Discharge: 2014-09-23 | Disposition: A | Discharge status: Home / Self Care | Payer: Managed Care, Other (non HMO) | Attending: Emergency Medicine | Admitting: Emergency Medicine

## 2014-09-23 ENCOUNTER — Emergency Department (HOSPITAL_COMMUNITY): Payer: Managed Care, Other (non HMO)

## 2014-09-23 DIAGNOSIS — J069 Acute upper respiratory infection, unspecified: Secondary | ICD-10-CM

## 2014-09-23 LAB — CBC
HCT: 38.1 % (ref 36.0–46.0)
Hemoglobin: 12.3 g/dL (ref 12.0–15.0)
MCH: 29 pg (ref 26.0–34.0)
MCHC: 32.3 g/dL (ref 30.0–36.0)
MCV: 89.9 fL (ref 78.0–100.0)
PLATELETS: 205 10*3/uL (ref 150–400)
RBC: 4.24 MIL/uL (ref 3.87–5.11)
RDW: 13.3 % (ref 11.5–15.5)
WBC: 10.1 10*3/uL (ref 4.0–10.5)

## 2014-09-23 LAB — BASIC METABOLIC PANEL
Anion gap: 10 (ref 5–15)
BUN: 8 mg/dL (ref 6–20)
CO2: 26 mmol/L (ref 22–32)
Calcium: 9.2 mg/dL (ref 8.9–10.3)
Chloride: 101 mmol/L (ref 101–111)
Creatinine, Ser: 0.69 mg/dL (ref 0.44–1.00)
Glucose, Bld: 108 mg/dL — ABNORMAL HIGH (ref 65–99)
Potassium: 3.3 mmol/L — ABNORMAL LOW (ref 3.5–5.1)
Sodium: 137 mmol/L (ref 135–145)

## 2014-09-23 LAB — I-STAT TROPONIN, ED: Troponin i, poc: 0 ng/mL (ref 0.00–0.08)

## 2014-09-23 LAB — BRAIN NATRIURETIC PEPTIDE: B NATRIURETIC PEPTIDE 5: 45.7 pg/mL (ref 0.0–100.0)

## 2014-09-23 MED ORDER — BENZONATATE 100 MG PO CAPS: 100.0000 mg | ORAL_CAPSULE | Freq: Three times a day (TID) | ORAL | Status: DC

## 2014-09-23 MED ORDER — FLUTICASONE PROPIONATE 50 MCG/ACT NA SUSP: 2.0000 | Freq: Every day | NASAL | Status: DC

## 2014-09-23 MED ORDER — KETOROLAC TROMETHAMINE 60 MG/2ML IM SOLN
60.0000 mg | Freq: Once | INTRAMUSCULAR | Status: AC
  Administered 2014-09-23: 60 mg via INTRAMUSCULAR
  Filled 2014-09-23: qty 2

## 2014-09-23 MED ORDER — DESLORATADINE 2.5 MG PO TBDP: 2.5000 mg | ORAL_TABLET | Freq: Every day | ORAL | Status: DC

## 2014-09-23 MED ORDER — LORATADINE 10 MG PO TABS
10.0000 mg | ORAL_TABLET | Freq: Once | ORAL | Status: AC
  Administered 2014-09-23: 10 mg via ORAL
  Filled 2014-09-23: qty 1

## 2014-09-23 MED ORDER — MAGIC MOUTHWASH: 5.0000 mL | Freq: Three times a day (TID) | ORAL | Status: DC | PRN

## 2014-09-23 MED ORDER — GI COCKTAIL ~~LOC~~
30.0000 mL | Freq: Once | ORAL | Status: AC
  Administered 2014-09-23: 30 mL via ORAL
  Filled 2014-09-23: qty 30

## 2014-09-23 MED ORDER — BENZONATATE 100 MG PO CAPS
200.0000 mg | ORAL_CAPSULE | Freq: Once | ORAL | Status: AC
  Administered 2014-09-23: 200 mg via ORAL
  Filled 2014-09-23: qty 2

## 2014-09-23 NOTE — ED Provider Notes (Signed)
CSN: [id number]     Arrival date & time 09/22/14  2330 History   This chart was scribed for Henryk Ursin, MD found by Rayna Sexton, ED scribe. This patient was seen in room B14C/B14C and the patient's care was started at 1:02 AM   Chief Complaint  Patient presents with  . Chest Pain  . Cough  . Sore Throat    Patient is a 44 y.o. female presenting with chest pain. The history is provided by the patient. No language interpreter was used.  Chest Pain Pain location:  Unable to specify Pain quality: aching   Pain radiates to:  Does not radiate Pain radiates to the back: no   Pain severity:  Moderate Onset quality:  Gradual Duration:  1 week Timing:  Constant Progression:  Unchanged Chronicity:  New Context: not breathing   Relieved by:  Nothing Worsened by:  Nothing tried Ineffective treatments:  None tried Associated symptoms: cough   Associated symptoms: no fever, no nausea and no weakness   Associated symptoms comment:  Sore throat, nasal congestion sneezing Risk factors: not female and no surgery     HPI Comments: [name] is a 44 y.o. female who presents to the Emergency Department complaining of constant, moderate chest pain with onset 1 week prior. Pt has been sick and notes coughing up green and yellow mucus. Pt additionally notes throat swelling, trouble swallowing and sharp pain in lungs. Pt notes taking mucinex and alka seltzer beginning 3 days ago. Pt denies smoking,surgeries or recent long trips.    Past Medical History  Diagnosis Date  . Hypertension   . Blood transfusion without reported diagnosis    Past Surgical History  Procedure Laterality Date  . Endometrial ablation     Family History  Problem Relation Age of Onset  . Hypertension Mother   . Hypertension Father   . Diabetes Sister   . Hypertension Sister   . Hypertension Brother   . Hypertension Son   . Cancer Paternal Aunt   . Diabetes Maternal Grandmother   . Hypertension Maternal  Grandmother   . Diabetes Paternal Grandmother   . Hypertension Paternal Grandmother   . Diabetes Sister    History  Substance Use Topics  . Smoking status: Never Smoker   . Smokeless tobacco: Not on file  . Alcohol Use: 6.0 oz/week    5 Cans of beer, 5 Shots of liquor per week     Comment: just on weekends   OB History    No data available     Review of Systems  Constitutional: Negative for fever.  HENT: Positive for sore throat and voice change.   Respiratory: Positive for cough.   Cardiovascular: Positive for chest pain.  Gastrointestinal: Negative for nausea.  Neurological: Negative for weakness.  All other systems reviewed and are negative.     Allergies  Review of patient's allergies indicates no known allergies.  Home Medications   Prior to Admission medications   Medication Sig Start Date End Date Taking? Authorizing Provider  amLODipine (NORVASC) 10 MG tablet Take 1 tablet (10 mg total) by mouth daily. 04/26/14   Gay Filler Copland, MD  atenolol (TENORMIN) 100 MG tablet Take 1 tablet (100 mg total) by mouth daily. 04/25/14   Mancel Bale, PA-C  erythromycin ophthalmic ointment Place a 1/2 inch ribbon of ointment into the lower eyelid TID for 7 days. 04/25/14   Pamella Pert, MD  ibuprofen (ADVIL,MOTRIN) 200 MG tablet Take 400 mg by mouth  every 6 (six) hours as needed for mild pain or moderate pain.    Historical Provider, MD  lisinopril (PRINIVIL,ZESTRIL) 40 MG tablet Take 1 tablet (40 mg total) by mouth daily. 04/25/14   Mancel Bale, PA-C  lisinopril (PRINIVIL,ZESTRIL) 40 MG tablet Take 1 tablet (40 mg total) by mouth daily. 04/25/14   Pamella Pert, MD  triamterene-hydrochlorothiazide (MAXZIDE-25) 37.5-25 MG per tablet Take 1 tablet by mouth daily. Patient not taking: Reported on 04/25/2014 02/24/14   Darreld Mclean, MD  triamterene-hydrochlorothiazide (MAXZIDE-25) 37.5-25 MG per tablet Take 1 tablet by mouth daily. 04/25/14   Pamella Pert, MD    BP 168/109 mmHg  Pulse 71  Temp(Src) 98.6 F (37 C) (Oral)  Resp 17  Ht 5\' 2"  (1.575 m)  Wt 193 lb 12.8 oz (87.907 kg)  BMI 35.44 kg/m2  SpO2 99%  LMP 09/15/2014 Physical Exam  Constitutional: She is oriented to person, place, and time. She appears well-developed and well-nourished. No distress.  HENT:  Head: Normocephalic and atraumatic.  Right Ear: Tympanic membrane normal.  Left Ear: Tympanic membrane normal.  Mouth/Throat: Oropharynx is clear and moist. No oropharyngeal exudate.  Moist mucus membranes No swelling of the lips, tongue, or uvula  Eyes: Conjunctivae and EOM are normal. Pupils are equal, round, and reactive to light.  mallempati 1  Neck: Normal range of motion. Neck supple. No tracheal deviation present.  Trachea midline; trachea is easily displaceable. Intact phonation, no pain with displacement of the trachea  Cardiovascular: Normal rate, regular rhythm and normal heart sounds.   Pulmonary/Chest: Effort normal and breath sounds normal. No stridor. No respiratory distress. She has no wheezes. She has no rales. She exhibits no tenderness.  Abdominal: Soft. Bowel sounds are normal. She exhibits no mass. There is no tenderness. There is no rebound and no guarding.  Musculoskeletal: Normal range of motion. She exhibits no edema or tenderness.  Lymphadenopathy:    She has no cervical adenopathy.  Neurological: She is alert and oriented to person, place, and time. She has normal reflexes.  Skin: Skin is warm and dry. She is not diaphoretic.  Psychiatric: She has a normal mood and affect. Her behavior is normal.  Nursing note and vitals reviewed.   ED Course  Procedures  DIAGNOSTIC STUDIES: Oxygen Saturation is 99% on RA, normal by my interpretation.    COORDINATION OF CARE: 1:09 AM Discussed treatment plan with pt at bedside and pt agreed to plan.  Labs Review Labs Reviewed  BASIC METABOLIC PANEL - Abnormal; Notable for the following:    Potassium 3.3 (*)     Glucose, Bld 108 (*)    All other components within normal limits  CBC  BRAIN NATRIURETIC PEPTIDE  I-STAT TROPOININ, ED    Imaging Review Dg Chest 2 View  09/23/2014   CLINICAL DATA:  Acute onset of cough, and chest pain and tightness. Neck swelling, with shortness of breath. Initial encounter.  EXAM: CHEST  2 VIEW  COMPARISON:  Chest radiograph performed 10/17/2013  FINDINGS: The lungs are well-aerated and clear. There is no evidence of focal opacification, pleural effusion or pneumothorax.  The heart is normal in size; the mediastinal contour is within normal limits. No acute osseous abnormalities are seen.  IMPRESSION: No acute cardiopulmonary process seen.   Electronically Signed   By: Garald Balding M.D.   On: 09/23/2014 00:34     EKG Interpretation   Date/Time:  Friday Sep 22 2014 23:45:16 EDT Ventricular Rate:  69 PR Interval:  170  QRS Duration: 96 QT Interval:  406 QTC Calculation: 435 R Axis:   10 Text Interpretation:  Normal sinus rhythm Confirmed by [facility]  MD,  Taariq Leitz ([id number]) on 09/23/2014 12:57:20 AM      MDM   Final diagnoses:  None   Centor Score -1 no indication for strep testing no change in voice swallowing pills and liquids.  Heart score 1, PERC negative highly doubt PE and acs in setting of viral illness will treat symptomatically.  In the setting of > 8 hours of symptoms with negative EKG and troponin ACS is excluded.    Close follow up with your PMD strict return precautions given.    I personally performed the services described in this documentation, which was scribed in my presence. The recorded information has been reviewed and is accurate.    Veatrice Kells, MD 09/23/14 [phone]

## 2014-11-10 ENCOUNTER — Encounter: Payer: Self-pay | Admitting: Family Medicine

## 2014-12-05 ENCOUNTER — Ambulatory Visit (INDEPENDENT_AMBULATORY_CARE_PROVIDER_SITE_OTHER): Payer: Managed Care, Other (non HMO) | Admitting: Family Medicine

## 2014-12-05 ENCOUNTER — Other Ambulatory Visit: Payer: Self-pay | Admitting: Family Medicine

## 2014-12-05 VITALS — BP 120/80 | HR 61 | Temp 98.1°F | Ht 62.0 in | Wt 187.4 lb

## 2014-12-05 DIAGNOSIS — I10 Essential (primary) hypertension: Secondary | ICD-10-CM

## 2014-12-05 DIAGNOSIS — R490 Dysphonia: Secondary | ICD-10-CM | POA: Diagnosis not present

## 2014-12-05 MED ORDER — LISINOPRIL 40 MG PO TABS: 40.0000 mg | ORAL_TABLET | Freq: Every day | ORAL | Status: DC

## 2014-12-05 MED ORDER — AMLODIPINE BESYLATE 10 MG PO TABS: 10.0000 mg | ORAL_TABLET | Freq: Every day | ORAL | Status: DC

## 2014-12-05 MED ORDER — ATENOLOL 100 MG PO TABS: 100.0000 mg | ORAL_TABLET | Freq: Every day | ORAL | Status: DC

## 2014-12-05 NOTE — Progress Notes (Signed)
[facility] [address], [place] [address] [phone]  Date:  12/05/2014   Name:  [name]   DOB:  Apr 27, 1971   MRN:  [id number]  PCP:  Delman Cheadle, MD    Chief Complaint: Hypertension; Rash; and Medication Refill   History of Present Illness:  [name] is a 44 y.o. very pleasant female patient who presents with the following:  Here today seeking a refill of her BP medications and also with concern about a rash.  She recently went on a trip to a family reunion- about 10 days ago.  She stayed with a family member, wonders if she was bitten by bugs or even bed bugs.   The next days she noted itchy bumps on her arms and legs.  She is no longer getting new bumps or itchy areas and the ones she has seem to be healing, but she has some dark spots left over at the site of the bites.   She has tried benadryl and claritin- itching is better ,but she has some dark spots on her arms left over.  She has noted a hoarse voice for 6 months or so- maybe longer.  Will come and go.  She is worried about this.    She has a history of poorly controlled HTN but currently she is looking great   She has been fasting for about 8 hours- would like to do labs today if we can  BP Readings from Last 3 Encounters:  12/05/14 120/80  09/23/14 139/83  04/25/14 115/69     Patient Active Problem List   Diagnosis Date Noted  . Hepatitis B immune 02/24/2014  . Acute bronchitis 10/19/2013  . CHF exacerbation 10/18/2013  . SOB (shortness of breath) 10/18/2013  . Chest pain 10/18/2013  . Cardiomegaly 08/23/2012  . Fibroid, uterine 03/17/2012  . HEADACHE, TENSION 02/02/2007  . HYPERTENSION 02/02/2007    Past Medical History  Diagnosis Date  . Hypertension   . Blood transfusion without reported diagnosis     Past Surgical History  Procedure Laterality Date  . Endometrial ablation      History  Substance Use Topics  . Smoking status: Never Smoker   . Smokeless  tobacco: Not on file  . Alcohol Use: 6.0 oz/week    5 Cans of beer, 5 Shots of liquor per week     Comment: just on weekends    Family History  Problem Relation Age of Onset  . Hypertension Mother   . Hypertension Father   . Diabetes Sister   . Hypertension Sister   . Hypertension Brother   . Hypertension Son   . Cancer Paternal Aunt   . Diabetes Maternal Grandmother   . Hypertension Maternal Grandmother   . Diabetes Paternal Grandmother   . Hypertension Paternal Grandmother   . Diabetes Sister     No Known Allergies  Medication list has been reviewed and updated.  Current Outpatient Prescriptions on File Prior to Visit  Medication Sig Dispense Refill  . amLODipine (NORVASC) 10 MG tablet Take 1 tablet (10 mg total) by mouth daily. 90 tablet 3  . atenolol (TENORMIN) 100 MG tablet Take 1 tablet (100 mg total) by mouth daily. 90 tablet 1  . lisinopril (PRINIVIL,ZESTRIL) 40 MG tablet Take 1 tablet (40 mg total) by mouth daily. 90 tablet 1  . Alum & Mag Hydroxide-Simeth (MAGIC MOUTHWASH) SOLN Take 5 mLs by mouth 3 (three) times daily as needed for mouth pain. (  Patient not taking: Reported on 12/05/2014) 100 mL 0  . benzonatate (TESSALON) 100 MG capsule Take 1 capsule (100 mg total) by mouth every 8 (eight) hours. (Patient not taking: Reported on 12/05/2014) 21 capsule 0  . desloratadine (CLARINEX REDITABS) 2.5 MG disintegrating tablet Take 1 tablet (2.5 mg total) by mouth daily. (Patient not taking: Reported on 12/05/2014) 10 tablet 0  . erythromycin ophthalmic ointment Place a 1/2 inch ribbon of ointment into the lower eyelid TID for 7 days. (Patient not taking: Reported on 12/05/2014) 1 g 0  . fluticasone (FLONASE) 50 MCG/ACT nasal spray Place 2 sprays into both nostrils daily. (Patient not taking: Reported on 12/05/2014) 16 g 0  . ibuprofen (ADVIL,MOTRIN) 200 MG tablet Take 400 mg by mouth every 6 (six) hours as needed for mild pain or moderate pain.    Marland Kitchen lisinopril (PRINIVIL,ZESTRIL) 40 MG  tablet Take 1 tablet (40 mg total) by mouth daily. (Patient not taking: Reported on 12/05/2014) 20 tablet 0  . triamterene-hydrochlorothiazide (MAXZIDE-25) 37.5-25 MG per tablet Take 1 tablet by mouth daily. (Patient not taking: Reported on 04/25/2014) 30 tablet 1  . triamterene-hydrochlorothiazide (MAXZIDE-25) 37.5-25 MG per tablet Take 1 tablet by mouth daily. (Patient not taking: Reported on 12/05/2014) 20 tablet 0   No current facility-administered medications on file prior to visit.    Review of Systems:  As per HPI- otherwise negative.   Physical Examination: Filed Vitals:   12/05/14 1832  BP: 120/80  Pulse: 61  Temp: 98.1 F (36.7 C)   Filed Vitals:   12/05/14 1832  Height: 5\' 2"  (1.575 m)  Weight: 187 lb 6 oz (84.993 kg)   Body mass index is 34.26 kg/(m^2). Ideal Body Weight: Weight in (lb) to have BMI = 25: 136.4  GEN: WDWN, NAD, Non-toxic, A & O x 3, obese, looks well HEENT: Atraumatic, Normocephalic. Neck supple. No masses, No LAD.  Bilateral TM wnl, oropharynx normal.  PEERL,EOMI.   Ears and Nose: No external deformity. CV: RRR, No M/G/R. No JVD. No thrill. No extra heart sounds. PULM: CTA B, no wheezes, crackles, rhonchi. No retractions. No resp. distress. No accessory muscle use. ABD: S, NT, ND, +BS. No rebound. No HSM. EXTR: No c/c/e NEURO Normal gait.  PSYCH: Normally interactive. Conversant. Not depressed or anxious appearing.  Calm demeanor.  Few discrete spots of likely post- inflamm hyperpigmentation on her arms. No more recent appearing insect bites or skin lesions    Assessment and Plan: Essential hypertension - Plan: lisinopril (PRINIVIL,ZESTRIL) 40 MG tablet, atenolol (TENORMIN) 100 MG tablet, amLODipine (NORVASC) 10 MG tablet, CBC, Comprehensive metabolic panel, Lipid panel  Chronic hoarseness - Plan: Ambulatory referral to ENT  BP is well controlled.  Continue current medications Did labs for her today See patient instructions for more details.      Signed Lamar Blinks, MD

## 2014-12-05 NOTE — Patient Instructions (Signed)
Good to see you today- your blood pressure looks great!  Continue taking your current medications I will be in touch with your labs We will arrange for your to see an ENT specialist about your hoarse voice The dark spots on your arms are likely just from recent bug bites- these will resolve with a few weeks time

## 2014-12-06 LAB — LIPID PANEL
CHOLESTEROL: 194 mg/dL (ref 125–200)
HDL: 55 mg/dL (ref >=46)
LDL Cholesterol: 104 mg/dL (ref <130)
TRIGLYCERIDES: 176 mg/dL — AB (ref <150)
Total CHOL/HDL Ratio: 3.5 Ratio (ref <=5.0)
VLDL: 35 mg/dL — ABNORMAL HIGH (ref <30)

## 2014-12-06 LAB — CBC
HCT: 43.3 % (ref 36.0–46.0)
HEMOGLOBIN: 14.7 g/dL (ref 12.0–15.0)
MCH: 29.8 pg (ref 26.0–34.0)
MCHC: 33.9 g/dL (ref 30.0–36.0)
MCV: 87.8 fL (ref 78.0–100.0)
MPV: 11.5 fL (ref 8.6–12.4)
Platelets: 274 10*3/uL (ref 150–400)
RBC: 4.93 MIL/uL (ref 3.87–5.11)
RDW: 13.1 % (ref 11.5–15.5)
WBC: 8.9 10*3/uL (ref 4.0–10.5)

## 2014-12-06 LAB — COMPREHENSIVE METABOLIC PANEL
ALT: 30 U/L — AB (ref 6–29)
AST: 30 U/L (ref 10–35)
Albumin: 4.4 g/dL (ref 3.6–5.1)
Alkaline Phosphatase: 69 U/L (ref 33–115)
BUN: 12 mg/dL (ref 7–25)
CALCIUM: 9.8 mg/dL (ref 8.6–10.2)
CHLORIDE: 100 mmol/L (ref 98–110)
CO2: 25 mmol/L (ref 20–31)
CREATININE: 0.81 mg/dL (ref 0.50–1.10)
Glucose, Bld: 91 mg/dL (ref 65–99)
POTASSIUM: 3.7 mmol/L (ref 3.5–5.3)
SODIUM: 138 mmol/L (ref 135–146)
TOTAL PROTEIN: 7.8 g/dL (ref 6.1–8.1)
Total Bilirubin: 1.9 mg/dL — ABNORMAL HIGH (ref 0.2–1.2)

## 2014-12-11 LAB — BILIRUBIN, FRACTIONATED(TOT/DIR/INDIR)
BILIRUBIN DIRECT: 0.2 mg/dL (ref <=0.2)
Indirect Bilirubin: 1 mg/dL (ref 0.2–1.2)
Total Bilirubin: 1.2 mg/dL (ref 0.2–1.2)

## 2014-12-15 ENCOUNTER — Telehealth: Payer: Self-pay

## 2014-12-15 NOTE — Telephone Encounter (Signed)
Pt would like for someone to go over the lab results from her last visit with her.

## 2014-12-15 NOTE — Telephone Encounter (Signed)
Dr. Lorelei Pont spoke with pt about lab results

## 2014-12-15 NOTE — Telephone Encounter (Signed)
LMVM for patient to CB.   

## 2014-12-15 NOTE — Telephone Encounter (Signed)
Patient is returning a missed phone call from lab. Please call!

## 2015-01-19 ENCOUNTER — Telehealth: Payer: Self-pay

## 2015-01-19 DIAGNOSIS — I10 Essential (primary) hypertension: Secondary | ICD-10-CM

## 2015-01-19 MED ORDER — LISINOPRIL 40 MG PO TABS: 40.0000 mg | ORAL_TABLET | Freq: Every day | ORAL | Status: DC

## 2015-01-19 MED ORDER — AMLODIPINE BESYLATE 10 MG PO TABS: 10.0000 mg | ORAL_TABLET | Freq: Every day | ORAL | Status: DC

## 2015-01-19 MED ORDER — ATENOLOL 100 MG PO TABS: 100.0000 mg | ORAL_TABLET | Freq: Every day | ORAL | Status: DC

## 2015-01-19 NOTE — Telephone Encounter (Signed)
Sent the Rx's for 30 days into [facility]. Pt notified.

## 2015-01-19 NOTE — Telephone Encounter (Signed)
Pt called wanting [place] to send in her 3 scripts atenolol (TENORMIN) 100 MG tablet [[id number]] and amLODipine (NORVASC) 10 MG tablet [[id number]]. Her home delivery system can't get her meds to her on time, she is completely out. She would like them sent to [facility]. Please advise at [phone]  lisinopril (PRINIVIL,ZESTRIL) 40 MG tablet [[id number]]

## 2015-05-01 ENCOUNTER — Emergency Department (HOSPITAL_COMMUNITY)
Admission: EM | Admit: 2015-05-01 | Discharge: 2015-05-01 | Disposition: A | Discharge status: Home / Self Care | Payer: Managed Care, Other (non HMO) | Attending: Physician Assistant | Admitting: Physician Assistant

## 2015-05-01 ENCOUNTER — Encounter (HOSPITAL_COMMUNITY): Payer: Self-pay | Admitting: Emergency Medicine

## 2015-05-01 DIAGNOSIS — R1030 Lower abdominal pain, unspecified: Secondary | ICD-10-CM | POA: Diagnosis present

## 2015-05-01 DIAGNOSIS — Z7951 Long term (current) use of inhaled steroids: Secondary | ICD-10-CM | POA: Diagnosis not present

## 2015-05-01 DIAGNOSIS — Z3202 Encounter for pregnancy test, result negative: Secondary | ICD-10-CM | POA: Insufficient documentation

## 2015-05-01 DIAGNOSIS — I10 Essential (primary) hypertension: Secondary | ICD-10-CM | POA: Insufficient documentation

## 2015-05-01 DIAGNOSIS — Z79899 Other long term (current) drug therapy: Secondary | ICD-10-CM | POA: Diagnosis not present

## 2015-05-01 DIAGNOSIS — N390 Urinary tract infection, site not specified: Secondary | ICD-10-CM | POA: Diagnosis not present

## 2015-05-01 LAB — URINE MICROSCOPIC-ADD ON

## 2015-05-01 LAB — URINALYSIS, ROUTINE W REFLEX MICROSCOPIC
Bilirubin Urine: NEGATIVE
Glucose, UA: NEGATIVE mg/dL
Ketones, ur: NEGATIVE mg/dL
Nitrite: POSITIVE — AB
Protein, ur: 100 mg/dL — AB
SPECIFIC GRAVITY, URINE: 1.024 (ref 1.005–1.030)
pH: 6 (ref 5.0–8.0)

## 2015-05-01 LAB — POC URINE PREG, ED: PREG TEST UR: NEGATIVE

## 2015-05-01 MED ORDER — NITROFURANTOIN MONOHYD MACRO 100 MG PO CAPS: 100.0000 mg | ORAL_CAPSULE | Freq: Two times a day (BID) | ORAL | Status: DC

## 2015-05-01 NOTE — ED Notes (Signed)
Pt sts abd bloating and pressure with pain with urination; pt sts some blood noted

## 2015-05-01 NOTE — ED Notes (Signed)
Merry Proud, PA aware of pts BP and states it is okay to discharge patient. Pt stated she did not take her BP meds today. Pt A&OX4, ambulatory at d/c with steady gait, NAD

## 2015-05-01 NOTE — Discharge Instructions (Signed)
Please read attached information. If you experience any new or worsening signs or symptoms please return to the emergency room for evaluation. Please follow-up with your primary care provider or specialist as discussed. Please use medication prescribed only as directed and discontinue taking if you have any concerning signs or symptoms.   °

## 2015-05-01 NOTE — ED Provider Notes (Signed)
CSN: [id number]     Arrival date & time 05/01/15  1229 History  By signing my name below, I, Rayna Sexton, attest that this documentation has been prepared under the direction and in the presence of [employer], PA-C. Electronically Signed: Rayna Sexton, ED Scribe. 05/01/2015. 6:03 PM.   Chief Complaint  Patient presents with  . Urinary Tract Infection  . Abdominal Pain   The history is provided by the patient. No language interpreter was used.     HPI Comments: [name] is a 45 y.o. female who presents to the Emergency Department complaining of worsening, moderate dysuria with onset 1 week ago. She notes associated, worsening, mild, suprapubic pressure and mild hematuria. Pt describes her abd pain as a pressure as if "her bladder is full" and denies a radiation of her abd pain. She notes cutting back on carbonated beverages and began drinking cranberry juice and denies any relief. She confirms being sexually active and denies having gone through menopause. She denies fevers, chills, n/v, vaginal discharge and vaginal pain.    Past Medical History  Diagnosis Date  . Hypertension   . Blood transfusion without reported diagnosis    Past Surgical History  Procedure Laterality Date  . Endometrial ablation     Family History  Problem Relation Age of Onset  . Hypertension Mother   . Hypertension Father   . Diabetes Sister   . Hypertension Sister   . Hypertension Brother   . Hypertension Son   . Cancer Paternal Aunt   . Diabetes Maternal Grandmother   . Hypertension Maternal Grandmother   . Diabetes Paternal Grandmother   . Hypertension Paternal Grandmother   . Diabetes Sister    Social History  Substance Use Topics  . Smoking status: Never Smoker   . Smokeless tobacco: None  . Alcohol Use: 6.0 oz/week    5 Cans of beer, 5 Shots of liquor per week     Comment: just on weekends   OB History    No data available     Review of Systems  All other systems reviewed  and are negative.   Allergies  Review of patient's allergies indicates no known allergies.  Home Medications   Prior to Admission medications   Medication Sig Start Date End Date Taking? Authorizing Provider  amLODipine (NORVASC) 10 MG tablet Take 1 tablet (10 mg total) by mouth daily. 01/19/15  Yes Tereasa Coop, PA-C  atenolol (TENORMIN) 100 MG tablet Take 1 tablet (100 mg total) by mouth daily. 01/19/15  Yes Tereasa Coop, PA-C  hydroxypropyl methylcellulose / hypromellose (ISOPTO TEARS / GONIOVISC) 2.5 % ophthalmic solution Place 1 drop into both eyes daily as needed for dry eyes.   Yes Historical Provider, MD  ibuprofen (ADVIL,MOTRIN) 200 MG tablet Take 400 mg by mouth every 6 (six) hours as needed for mild pain or moderate pain.   Yes Historical Provider, MD  lisinopril (PRINIVIL,ZESTRIL) 40 MG tablet Take 1 tablet (40 mg total) by mouth daily. 01/19/15  Yes Tereasa Coop, PA-C  Alum & Mag Hydroxide-Simeth (MAGIC MOUTHWASH) SOLN Take 5 mLs by mouth 3 (three) times daily as needed for mouth pain. Patient not taking: Reported on 12/05/2014 09/23/14   April Palumbo, MD  benzonatate (TESSALON) 100 MG capsule Take 1 capsule (100 mg total) by mouth every 8 (eight) hours. Patient not taking: Reported on 12/05/2014 09/23/14   April Palumbo, MD  desloratadine (CLARINEX REDITABS) 2.5 MG disintegrating tablet Take 1 tablet (2.5 mg total)  by mouth daily. Patient not taking: Reported on 12/05/2014 09/23/14   April Palumbo, MD  erythromycin ophthalmic ointment Place a 1/2 inch ribbon of ointment into the lower eyelid TID for 7 days. Patient not taking: Reported on 12/05/2014 04/25/14   Pamella Pert, MD  fluticasone [facility]) 50 MCG/ACT nasal spray Place 2 sprays into both nostrils daily. Patient not taking: Reported on 12/05/2014 09/23/14   April Palumbo, MD  nitrofurantoin, macrocrystal-monohydrate, (MACROBID) 100 MG capsule Take 1 capsule (100 mg total) by mouth 2 (two) times daily. 05/01/15   Shekita Boyden, PA-C   BP 182/102 mmHg  Pulse 60  Temp(Src) 98.1 F (36.7 C)  Resp 18  SpO2 99%   Physical Exam  Constitutional: She is oriented to person, place, and time. She appears well-developed and well-nourished.  HENT:  Head: Normocephalic and atraumatic.  Eyes: Conjunctivae are normal. Pupils are equal, round, and reactive to light. Right eye exhibits no discharge. Left eye exhibits no discharge. No scleral icterus.  Neck: Normal range of motion. No JVD present. No tracheal deviation present.  Pulmonary/Chest: Effort normal. No stridor.  Abdominal: Soft. She exhibits no distension and no mass. There is no tenderness. There is no rebound and no guarding.  Neurological: She is alert and oriented to person, place, and time. Coordination normal.  Skin: Skin is warm and dry.  Psychiatric: She has a normal mood and affect. Her behavior is normal. Judgment and thought content normal.  Nursing note and vitals reviewed.     ED Course  Procedures  COORDINATION OF CARE: 4:23 PM Pt presents today due to symptoms of a UTI. Discussed lab results and next steps with pt and she agreed to the plan.    Labs Review Labs Reviewed  URINALYSIS, ROUTINE W REFLEX MICROSCOPIC (NOT AT [facility]) - Abnormal; Notable for the following:    APPearance TURBID (*)    Hgb urine dipstick LARGE (*)    Protein, ur 100 (*)    Nitrite POSITIVE (*)    Leukocytes, UA LARGE (*)    All other components within normal limits  URINE MICROSCOPIC-ADD ON - Abnormal; Notable for the following:    Squamous Epithelial / LPF 6-30 (*)    Bacteria, UA MANY (*)    All other components within normal limits  POC URINE PREG, ED    Imaging Review No results found. I have personally reviewed and evaluated these lab results as part of my medical decision-making.   EKG Interpretation None      MDM   Final diagnoses:  UTI (lower urinary tract infection)    Labs: POC urine prior, urinalysis, urine microscopic- nitrite  leukocyte positive urine  Imaging: none indicated  Consults: none  Therapeutics: Discharged home on nitrofurantoin  Assessment: Urinary tract infection, patient has no significant abdominal tenderness, afebrile, vital signs reassuring, low suspicion for any other concerning pathology. Patient be treated with above medications, and encouraged follow-up with primary care in 2-3 days for reevaluation, return the emergency room if worsens.  Plan: Pt given strict return precautions, verbalized understanding and agreement to today's plan and had no further questions or concerns at the time of discharge.   I personally performed the services described in this documentation, which was scribed in my presence. The recorded information has been reviewed and is accurate.    Okey Regal, PA-C 05/01/15 1803  Courteney Julio Alm, MD 05/02/15 [id number]

## 2015-06-25 ENCOUNTER — Emergency Department (HOSPITAL_COMMUNITY)
Admission: EM | Admit: 2015-06-25 | Discharge: 2015-06-26 | Disposition: A | Discharge status: Home / Self Care | Payer: Managed Care, Other (non HMO) | Attending: Emergency Medicine | Admitting: Emergency Medicine

## 2015-06-25 ENCOUNTER — Emergency Department (HOSPITAL_COMMUNITY): Payer: Managed Care, Other (non HMO)

## 2015-06-25 ENCOUNTER — Encounter (HOSPITAL_COMMUNITY): Payer: Self-pay | Admitting: Emergency Medicine

## 2015-06-25 DIAGNOSIS — I10 Essential (primary) hypertension: Secondary | ICD-10-CM

## 2015-06-25 DIAGNOSIS — Z7951 Long term (current) use of inhaled steroids: Secondary | ICD-10-CM | POA: Insufficient documentation

## 2015-06-25 DIAGNOSIS — R0789 Other chest pain: Secondary | ICD-10-CM | POA: Diagnosis not present

## 2015-06-25 DIAGNOSIS — R51 Headache: Secondary | ICD-10-CM | POA: Diagnosis not present

## 2015-06-25 DIAGNOSIS — Z79899 Other long term (current) drug therapy: Secondary | ICD-10-CM | POA: Insufficient documentation

## 2015-06-25 DIAGNOSIS — R079 Chest pain, unspecified: Secondary | ICD-10-CM | POA: Diagnosis present

## 2015-06-25 DIAGNOSIS — R42 Dizziness and giddiness: Secondary | ICD-10-CM | POA: Insufficient documentation

## 2015-06-25 LAB — CBC
HCT: 40.9 % (ref 36.0–46.0)
Hemoglobin: 13.5 g/dL (ref 12.0–15.0)
MCH: 29.6 pg (ref 26.0–34.0)
MCHC: 33 g/dL (ref 30.0–36.0)
MCV: 89.7 fL (ref 78.0–100.0)
PLATELETS: 221 10*3/uL (ref 150–400)
RBC: 4.56 MIL/uL (ref 3.87–5.11)
RDW: 13.7 % (ref 11.5–15.5)
WBC: 7.5 10*3/uL (ref 4.0–10.5)

## 2015-06-25 LAB — BASIC METABOLIC PANEL
Anion gap: 14 (ref 5–15)
BUN: 11 mg/dL (ref 6–20)
CALCIUM: 8.9 mg/dL (ref 8.9–10.3)
CO2: 23 mmol/L (ref 22–32)
Chloride: 102 mmol/L (ref 101–111)
Creatinine, Ser: 0.69 mg/dL (ref 0.44–1.00)
GFR calc Af Amer: >60 mL/min (ref >60)
Glucose, Bld: 93 mg/dL (ref 65–99)
POTASSIUM: 3.5 mmol/L (ref 3.5–5.1)
SODIUM: 139 mmol/L (ref 135–145)

## 2015-06-25 LAB — I-STAT TROPONIN, ED: TROPONIN I, POC: 0 ng/mL (ref 0.00–0.08)

## 2015-06-25 MED ORDER — HYDRALAZINE HCL 20 MG/ML IJ SOLN
5.0000 mg | Freq: Once | INTRAMUSCULAR | Status: AC
  Administered 2015-06-25: 5 mg via INTRAVENOUS
  Filled 2015-06-25: qty 1

## 2015-06-25 NOTE — ED Provider Notes (Signed)
CSN: [id number]     Arrival date & time 06/25/15  1927 History   By signing my name below, I, Forrestine Him, attest that this documentation has been prepared under the direction and in the presence of Merryl Hacker, MD.  Electronically Signed: Forrestine Him, ED Scribe. 06/25/2015. 11:25 PM.   Chief Complaint  Patient presents with  . Chest Pain  . Dizziness   The history is provided by the patient. No language interpreter was used.    HPI Comments: [name] is a 45 y.o. female with a PMHx of HTN who presents to the Emergency Department complaining of intermittent, ongoing chest pain x few days. Pain is described as sharp and episodes last for a few seconds when they come. No aggravating or alleviating factors at this time. Pt also reports ongoing dizziness-feeling off balance and a mild HA. OTC Tylenol attempted at home with mild temporary improvement. No recent fever, chills, congestion, cough, nausea, vomiting, shortness of breath, diaphoresis. No recent long distance travel. No illicit drug use. No recent estrogen use. Ms. Stancill denies any recent missed doses of her HTN medications. Typically blood pressure runs 0000000 systolic. She is not currently on any anticoagulants.  PCP: Delman Cheadle, MD    Past Medical History  Diagnosis Date  . Hypertension   . Blood transfusion without reported diagnosis    Past Surgical History  Procedure Laterality Date  . Endometrial ablation     Family History  Problem Relation Age of Onset  . Hypertension Mother   . Hypertension Father   . Diabetes Sister   . Hypertension Sister   . Hypertension Brother   . Hypertension Son   . Cancer Paternal Aunt   . Diabetes Maternal Grandmother   . Hypertension Maternal Grandmother   . Diabetes Paternal Grandmother   . Hypertension Paternal Grandmother   . Diabetes Sister    Social History  Substance Use Topics  . Smoking status: Never Smoker   . Smokeless tobacco: None  . Alcohol Use: 6.0 oz/week     5 Cans of beer, 5 Shots of liquor per week     Comment: just on weekends   OB History    No data available     Review of Systems  Constitutional: Negative for fever and chills.  HENT: Negative for congestion.   Respiratory: Negative for cough and shortness of breath.   Cardiovascular: Positive for chest pain.  Gastrointestinal: Negative for nausea, vomiting and abdominal pain.  Genitourinary: Negative for dysuria.  Neurological: Positive for dizziness and headaches.  Psychiatric/Behavioral: Negative for confusion.  All other systems reviewed and are negative.     Allergies  Review of patient's allergies indicates no known allergies.  Home Medications   Prior to Admission medications   Medication Sig Start Date End Date Taking? Authorizing Provider  amLODipine (NORVASC) 10 MG tablet Take 1 tablet (10 mg total) by mouth daily. 01/19/15  Yes Tereasa Coop, PA-C  atenolol (TENORMIN) 100 MG tablet Take 1 tablet (100 mg total) by mouth daily. 01/19/15  Yes Tereasa Coop, PA-C  lisinopril (PRINIVIL,ZESTRIL) 40 MG tablet Take 1 tablet (40 mg total) by mouth daily. 01/19/15  Yes Tereasa Coop, PA-C  Alum & Mag Hydroxide-Simeth (MAGIC MOUTHWASH) SOLN Take 5 mLs by mouth 3 (three) times daily as needed for mouth pain. Patient not taking: Reported on 12/05/2014 09/23/14   April Palumbo, MD  benzonatate (TESSALON) 100 MG capsule Take 1 capsule (100 mg total) by mouth every 8 (  eight) hours. Patient not taking: Reported on 12/05/2014 09/23/14   April Palumbo, MD  desloratadine (CLARINEX REDITABS) 2.5 MG disintegrating tablet Take 1 tablet (2.5 mg total) by mouth daily. Patient not taking: Reported on 12/05/2014 09/23/14   April Palumbo, MD  erythromycin ophthalmic ointment Place a 1/2 inch ribbon of ointment into the lower eyelid TID for 7 days. Patient not taking: Reported on 12/05/2014 04/25/14   Pamella Pert, MD  fluticasone [facility]) 50 MCG/ACT nasal spray Place 2 sprays into both  nostrils daily. Patient not taking: Reported on 12/05/2014 09/23/14   April Palumbo, MD  nitrofurantoin, macrocrystal-monohydrate, (MACROBID) 100 MG capsule Take 1 capsule (100 mg total) by mouth 2 (two) times daily. Patient not taking: Reported on 06/25/2015 05/01/15   Okey Regal, PA-C   Triage Vitals: BP 172/102 mmHg  Pulse 98  Temp(Src) 98.5 F (36.9 C) (Oral)  Resp 22  Ht 5\' 2"  (1.575 m)  Wt 192 lb (87.091 kg)  BMI 35.11 kg/m2  SpO2 99%  LMP 05/30/2015 (Exact Date)   Physical Exam  Constitutional: She is oriented to person, place, and time. She appears well-developed and well-nourished. No distress.  HENT:  Head: Normocephalic and atraumatic.  Mouth/Throat: Oropharynx is clear and moist.  Eyes: Conjunctivae and EOM are normal. Pupils are equal, round, and reactive to light.  Neck: Neck supple.  Cardiovascular: Normal rate, regular rhythm and normal heart sounds.   No murmur heard. Pulmonary/Chest: Effort normal and breath sounds normal. No respiratory distress. She has no wheezes. She exhibits no tenderness.  Abdominal: Soft. Bowel sounds are normal. There is no tenderness. There is no rebound.  Neurological: She is alert and oriented to person, place, and time.  Cranial nerves II through XII intact, no dysmetria to finger-nose-finger, 5 out of 5 strength in all 4 extremities  Skin: Skin is warm and dry.  Psychiatric: She has a normal mood and affect.  Nursing note and vitals reviewed.   ED Course  Procedures (including critical care time)  DIAGNOSTIC STUDIES: Oxygen Saturation is 99% on RA, Normal by my interpretation.    COORDINATION OF CARE: 11:24 PM- Will order blood work, CXR, and EKG. Discussed treatment plan with pt at bedside and pt agreed to plan.     Labs Review Labs Reviewed  BASIC METABOLIC PANEL  CBC  I-STAT [place], ED  [facility]ol[facility] Idol, ED    Imaging Review Dg Chest 2 View  06/25/2015  CLINICAL DATA:  45 year old female with chest pain and  syncope EXAM: CHEST  2 VIEW COMPARISON:  Radiograph dated 09/23/2014 FINDINGS: The heart size and mediastinal contours are within normal limits. Both lungs are clear. The visualized skeletal structures are unremarkable. IMPRESSION: No active cardiopulmonary disease. Electronically Signed   By: Anner Crete M.D.   On: 06/25/2015 20:19   I have personally reviewed and evaluated these images and lab results as part of my medical decision-making.   EKG Interpretation   Date/Time:  Monday June 25 2015 19:31:43 EST Ventricular Rate:  99 PR Interval:  146 QRS Duration: 92 QT Interval:  376 QTC Calculation: 482 R Axis:   10 Text Interpretation:  Normal sinus rhythm Left ventricular hypertrophy `no  acute change compared to 04/25/2014 Confirmed by Ashok Cordia  MD, Lennette Bihari ([id number])  on 06/25/2015 10:53:09 PM      MDM   Final diagnoses:  Other chest pain  Essential hypertension    Patient presents with chest pain and dizziness. This in the setting of high blood pressure. Initial blood pressure 215/135.  EKG shows no evidence of acute ischemia. Troponin 2 is negative. Doubt ACS.  May be related to pressure.  Basic labwork is reassuring. Patient was given IV hydralazine with improvement of her blood pressures into the 160s over 100. On recheck, patient reports improvement of symptoms. No evidence of orthostasis. Discussed with patient that she needed close follow-up with her primary physician for repeat blood pressure check and medication adjustment. If she has recurrent symptoms including chest pain, headache, or any new or worsening symptoms she needs to be reevaluated immediately.  After history, exam, and medical workup I feel the patient has been appropriately medically screened and is safe for discharge home. Pertinent diagnoses were discussed with the patient. Patient was given return precautions.  I personally performed the services described in this documentation, which was scribed in my  presence. The recorded information has been reviewed and is accurate.   Merryl Hacker, MD 06/26/15 [phone]

## 2015-06-25 NOTE — ED Notes (Signed)
Patient here with complaint of chest pain and dizziness which has persisted throughout today. BP high in triage. Patient states she hasn't missed any doses of BP medications.

## 2015-06-26 LAB — I-STAT TROPONIN, ED: Troponin i, poc: 0.01 ng/mL (ref 0.00–0.08)

## 2015-06-26 MED ORDER — HYDRALAZINE HCL 20 MG/ML IJ SOLN
10.0000 mg | Freq: Once | INTRAMUSCULAR | Status: AC
  Administered 2015-06-26: 10 mg via INTRAVENOUS
  Filled 2015-06-26: qty 1

## 2015-06-26 NOTE — Discharge Instructions (Signed)
Hypertension °Hypertension, commonly called high blood pressure, is when the force of blood pumping through your arteries is too strong. Your arteries are the blood vessels that carry blood from your heart throughout your body. A blood pressure reading consists of a higher number over a lower number, such as 110/72. The higher number (systolic) is the pressure inside your arteries when your heart pumps. The lower number (diastolic) is the pressure inside your arteries when your heart relaxes. Ideally you want your blood pressure below 120/80. °Hypertension forces your heart to work harder to pump blood. Your arteries may become narrow or stiff. Having untreated or uncontrolled hypertension can cause heart attack, stroke, kidney disease, and other problems. °RISK FACTORS °Some risk factors for high blood pressure are controllable. Others are not.  °Risk factors you cannot control include:  °· Race. You may be at higher risk if you are African American. °· Age. Risk increases with age. °· Gender. Men are at higher risk than women before age 45 years. After age 65, women are at higher risk than men. °Risk factors you can control include: °· Not getting enough exercise or physical activity. °· Being overweight. °· Getting too much fat, sugar, calories, or salt in your diet. °· Drinking too much alcohol. °SIGNS AND SYMPTOMS °Hypertension does not usually cause signs or symptoms. Extremely high blood pressure (hypertensive crisis) may cause headache, anxiety, shortness of breath, and nosebleed. °DIAGNOSIS °To check if you have hypertension, your health care provider will measure your blood pressure while you are seated, with your arm held at the level of your heart. It should be measured at least twice using the same arm. Certain conditions can cause a difference in blood pressure between your right and left arms. A blood pressure reading that is higher than normal on one occasion does not mean that you need treatment. If  it is not clear whether you have high blood pressure, you may be asked to return on a different day to have your blood pressure checked again. Or, you may be asked to monitor your blood pressure at home for 1 or more weeks. °TREATMENT °Treating high blood pressure includes making lifestyle changes and possibly taking medicine. Living a healthy lifestyle can help lower high blood pressure. You may need to change some of your habits. °Lifestyle changes may include: °· Following the DASH diet. This diet is high in fruits, vegetables, and whole grains. It is low in salt, red meat, and added sugars. °· Keep your sodium intake below 2,300 mg per day. °· Getting at least 30-45 minutes of aerobic exercise at least 4 times per week. °· Losing weight if necessary. °· Not smoking. °· Limiting alcoholic beverages. °· Learning ways to reduce stress. °Your health care provider may prescribe medicine if lifestyle changes are not enough to get your blood pressure under control, and if one of the following is true: °· You are 18-59 years of age and your systolic blood pressure is above 140. °· You are 60 years of age or older, and your systolic blood pressure is above 150. °· Your diastolic blood pressure is above 90. °· You have diabetes, and your systolic blood pressure is over 140 or your diastolic blood pressure is over 90. °· You have kidney disease and your blood pressure is above 140/90. °· You have heart disease and your blood pressure is above 140/90. °Your personal target blood pressure may vary depending on your medical conditions, your age, and other factors. °HOME CARE INSTRUCTIONS °·   Have your blood pressure rechecked as directed by your health care provider.   °· Take medicines only as directed by your health care provider. Follow the directions carefully. Blood pressure medicines must be taken as prescribed. The medicine does not work as well when you skip doses. Skipping doses also puts you at risk for  problems. °· Do not smoke.   °· Monitor your blood pressure at home as directed by your health care provider.  °SEEK MEDICAL CARE IF:  °· You think you are having a reaction to medicines taken. °· You have recurrent headaches or feel dizzy. °· You have swelling in your ankles. °· You have trouble with your vision. °SEEK IMMEDIATE MEDICAL CARE IF: °· You develop a severe headache or confusion. °· You have unusual weakness, numbness, or feel faint. °· You have severe chest or abdominal pain. °· You vomit repeatedly. °· You have trouble breathing. °MAKE SURE YOU:  °· Understand these instructions. °· Will watch your condition. °· Will get help right away if you are not doing well or get worse. °  °This information is not intended to replace advice given to you by your health care provider. Make sure you discuss any questions you have with your health care provider. °  °Document Released: 04/14/2005 Document Revised: 08/29/2014 Document Reviewed: 02/04/2013 °Elsevier Interactive Patient Education ©2016 [employer] ° °Nonspecific Chest Pain  °Chest pain can be caused by many different conditions. There is always a chance that your pain could be related to something serious, such as a heart attack or a blood clot in your lungs. Chest pain can also be caused by conditions that are not life-threatening. If you have chest pain, it is very important to follow up with your health care provider. °CAUSES  °Chest pain can be caused by: °· Heartburn. °· Pneumonia or bronchitis. °· Anxiety or stress. °· Inflammation around your heart (pericarditis) or lung (pleuritis or pleurisy). °· A blood clot in your lung. °· A collapsed lung (pneumothorax). It can develop suddenly on its own (spontaneous pneumothorax) or from trauma to the chest. °· Shingles infection (varicella-zoster virus). °· Heart attack. °· Damage to the bones, muscles, and cartilage that make up your chest wall. This can include: °¨ Bruised bones due to  injury. °¨ Strained muscles or cartilage due to frequent or repeated coughing or overwork. °¨ Fracture to one or more ribs. °¨ Sore cartilage due to inflammation (costochondritis). °RISK FACTORS  °Risk factors for chest pain may include: °· Activities that increase your risk for trauma or injury to your chest. °· Respiratory infections or conditions that cause frequent coughing. °· Medical conditions or overeating that can cause heartburn. °· Heart disease or family history of heart disease. °· Conditions or health behaviors that increase your risk of developing a blood clot. °· Having had chicken pox (varicella zoster). °SIGNS AND SYMPTOMS °Chest pain can feel like: °· Burning or tingling on the surface of your chest or deep in your chest. °· Crushing, pressure, aching, or squeezing pain. °· Dull or sharp pain that is worse when you move, cough, or take a deep breath. °· Pain that is also felt in your back, neck, shoulder, or arm, or pain that spreads to any of these areas. °Your chest pain may come and go, or it may stay constant. °DIAGNOSIS °Lab tests or other studies may be needed to find the cause of your pain. Your health care provider may have you take a test called an ambulatory ECG (electrocardiogram). An ECG records   your heartbeat patterns at the time the test is performed. You may also have other tests, such as: °· Transthoracic echocardiogram (TTE). During echocardiography, sound waves are used to create a picture of all of the heart structures and to look at how blood flows through your heart. °· Transesophageal echocardiogram (TEE). This is a more advanced imaging test that obtains images from inside your body. It allows your health care provider to see your heart in finer detail. °· Cardiac monitoring. This allows your health care provider to monitor your heart rate and rhythm in real time. °· Holter monitor. This is a portable device that records your heartbeat and can help to diagnose abnormal  heartbeats. It allows your health care provider to track your heart activity for several days, if needed. °· Stress tests. These can be done through exercise or by taking medicine that makes your heart beat more quickly. °· Blood tests. °· Imaging tests. °TREATMENT  °Your treatment depends on what is causing your chest pain. Treatment may include: °· Medicines. These may include: °¨ Acid blockers for heartburn. °¨ Anti-inflammatory medicine. °¨ Pain medicine for inflammatory conditions. °¨ Antibiotic medicine, if an infection is present. °¨ Medicines to dissolve blood clots. °¨ Medicines to treat coronary artery disease. °· Supportive care for conditions that do not require medicines. This may include: °¨ Resting. °¨ Applying heat or cold packs to injured areas. °¨ Limiting activities until pain decreases. °HOME CARE INSTRUCTIONS °· If you were prescribed an antibiotic medicine, finish it all even if you start to feel better. °· Avoid any activities that bring on chest pain. °· Do not use any tobacco products, including cigarettes, chewing tobacco, or electronic cigarettes. If you need help quitting, ask your health care provider. °· Do not drink alcohol. °· Take medicines only as directed by your health care provider. °· Keep all follow-up visits as directed by your health care provider. This is important. This includes any further testing if your chest pain does not go away. °· If heartburn is the cause for your chest pain, you may be told to keep your head raised (elevated) while sleeping. This reduces the chance that acid will go from your stomach into your esophagus. °· Make lifestyle changes as directed by your health care provider. These may include: °¨ Getting regular exercise. Ask your health care provider to suggest some activities that are safe for you. °¨ Eating a heart-healthy diet. A registered dietitian can help you to learn healthy eating options. °¨ Maintaining a healthy weight. °¨ Managing  diabetes, if necessary. °¨ Reducing stress. °SEEK MEDICAL CARE IF: °· Your chest pain does not go away after treatment. °· You have a rash with blisters on your chest. °· You have a fever. °SEEK IMMEDIATE MEDICAL CARE IF:  °· Your chest pain is worse. °· You have an increasing cough, or you cough up blood. °· You have severe abdominal pain. °· You have severe weakness. °· You faint. °· You have chills. °· You have sudden, unexplained chest discomfort. °· You have sudden, unexplained discomfort in your arms, back, neck, or jaw. °· You have shortness of breath at any time. °· You suddenly start to sweat, or your skin gets clammy. °· You feel nauseous or you vomit. °· You suddenly feel light-headed or dizzy. °· Your heart begins to beat quickly, or it feels like it is skipping beats. °These symptoms may represent a serious problem that is an emergency. Do not wait to see if the symptoms will go   away. Get medical help right away. Call your local emergency services (911 in the [place] Do not drive yourself to the hospital. °  °This information is not intended to replace advice given to you by your health care provider. Make sure you discuss any questions you have with your health care provider. °  °Document Released: 01/22/2005 Document Revised: 05/05/2014 Document Reviewed: 11/18/2013 °Elsevier Interactive Patient Education ©2016 [employer] ° °

## 2015-08-15 ENCOUNTER — Ambulatory Visit (INDEPENDENT_AMBULATORY_CARE_PROVIDER_SITE_OTHER): Payer: Managed Care, Other (non HMO) | Admitting: Family Medicine

## 2015-08-15 ENCOUNTER — Ambulatory Visit (INDEPENDENT_AMBULATORY_CARE_PROVIDER_SITE_OTHER): Payer: Managed Care, Other (non HMO)

## 2015-08-15 VITALS — BP 210/100 | HR 74 | Temp 97.5°F | Resp 16 | Ht 62.0 in | Wt 198.0 lb

## 2015-08-15 DIAGNOSIS — I10 Essential (primary) hypertension: Secondary | ICD-10-CM

## 2015-08-15 DIAGNOSIS — E041 Nontoxic single thyroid nodule: Secondary | ICD-10-CM

## 2015-08-15 DIAGNOSIS — K219 Gastro-esophageal reflux disease without esophagitis: Secondary | ICD-10-CM | POA: Diagnosis not present

## 2015-08-15 DIAGNOSIS — R0600 Dyspnea, unspecified: Secondary | ICD-10-CM

## 2015-08-15 DIAGNOSIS — J301 Allergic rhinitis due to pollen: Secondary | ICD-10-CM | POA: Diagnosis not present

## 2015-08-15 LAB — POCT CBC
Granulocyte percent: 72.2 %G (ref 37–80)
HEMATOCRIT: 39.4 % (ref 37.7–47.9)
HEMOGLOBIN: 13.5 g/dL (ref 12.2–16.2)
Lymph, poc: 2 (ref 0.6–3.4)
MCH: 30.3 pg (ref 27–31.2)
MCHC: 34.3 g/dL (ref 31.8–35.4)
MCV: 88.2 fL (ref 80–97)
MID (cbc): 0.2 (ref 0–0.9)
MPV: 8.4 fL (ref 0–99.8)
POC GRANULOCYTE: 5.8 (ref 2–6.9)
POC LYMPH PERCENT: 24.9 %L (ref 10–50)
POC MID %: 2.9 % (ref 0–12)
Platelet Count, POC: 202 10*3/uL (ref 142–424)
RBC: 4.47 M/uL (ref 4.04–5.48)
RDW, POC: 14.9 %
WBC: 8.1 10*3/uL (ref 4.6–10.2)

## 2015-08-15 LAB — POCT URINALYSIS DIP (MANUAL ENTRY)
BILIRUBIN UA: NEGATIVE
Blood, UA: NEGATIVE
GLUCOSE UA: NEGATIVE
Leukocytes, UA: NEGATIVE
Nitrite, UA: NEGATIVE
SPEC GRAV UA: 1.02
UROBILINOGEN UA: 0.2
pH, UA: 5.5

## 2015-08-15 LAB — GLUCOSE, POCT (MANUAL RESULT ENTRY): POC Glucose: 93 mg/dl (ref 70–99)

## 2015-08-15 MED ORDER — AMLODIPINE BESYLATE 10 MG PO TABS: 10.0000 mg | ORAL_TABLET | Freq: Every day | ORAL | Status: DC

## 2015-08-15 MED ORDER — LOSARTAN POTASSIUM-HCTZ 100-25 MG PO TABS: 1.0000 | ORAL_TABLET | Freq: Every day | ORAL | Status: DC

## 2015-08-15 MED ORDER — ATENOLOL 100 MG PO TABS: 100.0000 mg | ORAL_TABLET | Freq: Every day | ORAL | Status: DC

## 2015-08-15 MED ORDER — LORATADINE 10 MG PO TABS: 10.0000 mg | ORAL_TABLET | Freq: Every day | ORAL | Status: DC

## 2015-08-15 NOTE — Patient Instructions (Addendum)
   IF you received an x-ray today, you will receive an invoice from [facility]. Please contact [facility] at [phone] with questions or concerns regarding your invoice.   IF you received labwork today, you will receive an invoice from [facility]. Please contact [facility] at [phone] with questions or concerns regarding your invoice.   Our billing staff will not be able to assist you with questions regarding bills from these companies.  You will be contacted with the lab results as soon as they are available. The fastest way to get your results is to activate your My Chart account. Instructions are located on the last page of this paperwork. If you have not heard from us regarding the results in 2 weeks, please contact this office.     DASH Eating Plan DASH stands for "Dietary Approaches to Stop Hypertension." The DASH eating plan is a healthy eating plan that has been shown to reduce high blood pressure (hypertension). Additional health benefits may include reducing the risk of type 2 diabetes mellitus, heart disease, and stroke. The DASH eating plan may also help with weight loss. WHAT DO I NEED TO KNOW ABOUT THE DASH EATING PLAN? For the DASH eating plan, you will follow these general guidelines:  Choose foods with a percent daily value for sodium of less than 5% (as listed on the food label).  Use salt-free seasonings or herbs instead of table salt or sea salt.  Check with your health care provider or pharmacist before using salt substitutes.  Eat lower-sodium products, often labeled as "lower sodium" or "no salt added."  Eat fresh foods.  Eat more vegetables, fruits, and low-fat dairy products.  Choose whole grains. Look for the word "whole" as the first word in the ingredient list.  Choose fish and skinless chicken or turkey more often than red meat. Limit fish, poultry, and meat to 6 oz (170 g) each day.  Limit sweets,  desserts, sugars, and sugary drinks.  Choose heart-healthy fats.  Limit cheese to 1 oz (28 g) per day.  Eat more home-cooked food and less restaurant, buffet, and fast food.  Limit fried foods.  Cook foods using methods other than frying.  Limit canned vegetables. If you do use them, rinse them well to decrease the sodium.  When eating at a restaurant, ask that your food be prepared with less salt, or no salt if possible. WHAT FOODS CAN I EAT? Seek help from a dietitian for individual calorie needs. Grains Whole grain or whole wheat bread. Brown rice. Whole grain or whole wheat pasta. Quinoa, bulgur, and whole grain cereals. Low-sodium cereals. Corn or whole wheat flour tortillas. Whole grain cornbread. Whole grain crackers. Low-sodium crackers. Vegetables Fresh or frozen vegetables (raw, steamed, roasted, or grilled). Low-sodium or reduced-sodium tomato and vegetable juices. Low-sodium or reduced-sodium tomato sauce and paste. Low-sodium or reduced-sodium canned vegetables.  Fruits All fresh, canned (in natural juice), or frozen fruits. Meat and Other Protein Products Ground beef (85% or leaner), grass-fed beef, or beef trimmed of fat. Skinless chicken or turkey. Ground chicken or turkey. Pork trimmed of fat. All fish and seafood. Eggs. Dried beans, peas, or lentils. Unsalted nuts and seeds. Unsalted canned beans. Dairy Low-fat dairy products, such as skim or 1% milk, 2% or reduced-fat cheeses, low-fat ricotta or cottage cheese, or plain low-fat yogurt. Low-sodium or reduced-sodium cheeses. Fats and Oils Tub margarines without trans fats. Light or reduced-fat mayonnaise and salad dressings (reduced sodium). Avocado. Safflower, olive, or canola   oils. Natural peanut or almond butter. Other Unsalted popcorn and pretzels. The items listed above may not be a complete list of recommended foods or beverages. Contact your dietitian for more options. WHAT FOODS ARE NOT  RECOMMENDED? Grains White bread. White pasta. White rice. Refined cornbread. Bagels and croissants. Crackers that contain trans fat. Vegetables Creamed or fried vegetables. Vegetables in a cheese sauce. Regular canned vegetables. Regular canned tomato sauce and paste. Regular tomato and vegetable juices. Fruits Dried fruits. Canned fruit in light or heavy syrup. Fruit juice. Meat and Other Protein Products Fatty cuts of meat. Ribs, chicken wings, bacon, sausage, bologna, salami, chitterlings, fatback, hot dogs, bratwurst, and packaged luncheon meats. Salted nuts and seeds. Canned beans with salt. Dairy Whole or 2% milk, cream, half-and-half, and cream cheese. Whole-fat or sweetened yogurt. Full-fat cheeses or blue cheese. Nondairy creamers and whipped toppings. Processed cheese, cheese spreads, or cheese curds. Condiments Onion and garlic salt, seasoned salt, table salt, and sea salt. Canned and packaged gravies. Worcestershire sauce. Tartar sauce. Barbecue sauce. Teriyaki sauce. Soy sauce, including reduced sodium. Steak sauce. Fish sauce. Oyster sauce. Cocktail sauce. Horseradish. Ketchup and mustard. Meat flavorings and tenderizers. Bouillon cubes. Hot sauce. Tabasco sauce. Marinades. Taco seasonings. Relishes. Fats and Oils Butter, stick margarine, lard, shortening, ghee, and bacon fat. Coconut, palm kernel, or palm oils. Regular salad dressings. Other Pickles and olives. Salted popcorn and pretzels. The items listed above may not be a complete list of foods and beverages to avoid. Contact your dietitian for more information. WHERE CAN I FIND MORE INFORMATION? [employer], [employer], [employer]: www.nhlbi.nih.gov/health/health-topics/topics/dash/   This information is not intended to replace advice given to you by your health care provider. Make sure you discuss any questions you have with your health care provider.   Document Released: 04/03/2011 Document Revised: 05/05/2014  Document Reviewed: 02/16/2013 Elsevier Interactive Patient Education 2016 [employer]  

## 2015-08-15 NOTE — Progress Notes (Signed)
Subjective:    Patient ID: [name], female    DOB: 1970/12/03, 45 y.o.   MRN: [id number]  08/15/2015  Annual Exam and Hypertension   HPI This 45 y.o. female presents for Complete Physical Examination.  Last physical:  02-24-2014 Pap smear:  2 years ago; OB/GYN every 3-5 years.   WNL Mammogram:  11-10-2014 TDAP:  2015 Pneumovax:  2015 Influenza:  2016 Eye exam:  2015 Dental exam:  Several years  HTN: Patient reports POOR compliance with medication, good tolerance to medication, and good symptom control.  Did not take medication today; frequently does not take over weekends.  Forgets most days.  Sister is a Marine scientist.  In past got on DASH diet, exercised and BP improves.  Wheezing, swelling, weight gain: onset two months; DOE.  Recent admission for four months.  Has been going on for years.    Throat issue: s/p ENT consultation; +hoarseness daily for 1-2 years.  Dx with GERD; stopped PPI due to fear of side effects.  R sided thyroid mass; never went for [place].     Review of Systems  Constitutional: Negative for fever, chills, diaphoresis, activity change, appetite change, fatigue and unexpected weight change.  HENT: Positive for voice change. Negative for congestion, dental problem, drooling, ear discharge, ear pain, facial swelling, hearing loss, mouth sores, nosebleeds, postnasal drip, rhinorrhea, sinus pressure, sneezing, sore throat, tinnitus and trouble swallowing.   Eyes: Negative for photophobia, pain, discharge, redness, itching and visual disturbance.  Respiratory: Positive for shortness of breath and wheezing. Negative for apnea, cough, choking, chest tightness and stridor.   Cardiovascular: Positive for leg swelling. Negative for chest pain and palpitations.  Gastrointestinal: Negative for nausea, vomiting, abdominal pain, diarrhea, constipation, blood in stool, abdominal distention, anal bleeding and rectal pain.  Endocrine: Negative for cold intolerance, heat intolerance,  polydipsia, polyphagia and polyuria.  Genitourinary: Negative for dysuria, urgency, frequency, hematuria, flank pain, decreased urine volume, vaginal bleeding, vaginal discharge, enuresis, difficulty urinating, genital sores, vaginal pain, menstrual problem, pelvic pain and dyspareunia.  Musculoskeletal: Negative for myalgias, back pain, joint swelling, arthralgias, gait problem, neck pain and neck stiffness.  Skin: Negative for color change, pallor, rash and wound.  Allergic/Immunologic: Negative for environmental allergies, food allergies and immunocompromised state.  Neurological: Negative for dizziness, tremors, seizures, syncope, facial asymmetry, speech difficulty, weakness, light-headedness, numbness and headaches.  Hematological: Negative for adenopathy. Does not bruise/bleed easily.  Psychiatric/Behavioral: Negative for suicidal ideas, hallucinations, behavioral problems, confusion, sleep disturbance, self-injury, dysphoric mood, decreased concentration and agitation. The patient is not nervous/anxious and is not hyperactive.     Past Medical History  Diagnosis Date  . Hypertension   . Blood transfusion without reported diagnosis    Past Surgical History  Procedure Laterality Date  . Endometrial ablation     No Known Allergies  Social History   Social History  . Marital Status: Single    Spouse Name: N/A  . Number of Children: N/A  . Years of Education: N/A   Occupational History  . Not on file.   Social History Main Topics  . Smoking status: Never Smoker   . Smokeless tobacco: Never Used  . Alcohol Use: 6.0 oz/week    5 Cans of beer, 5 Shots of liquor per week     Comment: just on weekends  . Drug Use: No  . Sexual Activity: Yes    Birth Control/ Protection: Surgical   Other Topics Concern  . Not on file   Social History Narrative  Marital status: single; not dating      Children: 4 children (25, 30, 32, 74), 1 grandchild      Lives: with 2 children       Employment:  Works at makes [employer]      Tobacco:  None      Alcohol: 3 drinks per week; usually weekends      Drugs: none      Exercise: none               Family History  Problem Relation Age of Onset  . Hypertension Mother   . Hypertension Father   . Diabetes Father   . Diabetes Sister   . Hypertension Sister   . Hypertension Brother   . Hypertension Son   . Cancer Paternal Aunt   . Diabetes Maternal Grandmother   . Hypertension Maternal Grandmother   . Diabetes Paternal Grandmother   . Hypertension Paternal Grandmother   . Diabetes Sister        Objective:    BP 210/100 mmHg  Pulse 74  Temp(Src) 97.5 F (36.4 C) (Oral)  Resp 16  Ht 5\' 2"  (1.575 m)  Wt 198 lb (89.812 kg)  BMI 36.21 kg/m2  SpO2 95%  LMP 07/27/2015 (Approximate) Physical Exam  Constitutional: She is oriented to person, place, and time. She appears well-developed and well-nourished. No distress.  HENT:  Head: Normocephalic and atraumatic.  Right Ear: External ear normal.  Left Ear: External ear normal.  Nose: Nose normal.  Mouth/Throat: Oropharynx is clear and moist.  Eyes: Conjunctivae and EOM are normal. Pupils are equal, round, and reactive to light.  Neck: Normal range of motion and full passive range of motion without pain. Neck supple. No JVD present. Carotid bruit is not present. Thyromegaly present.  R>L thyromegaly.  Cardiovascular: Normal rate, regular rhythm and normal heart sounds.  Exam reveals no gallop and no friction rub.   No murmur heard. Pulmonary/Chest: Effort normal and breath sounds normal. She has no wheezes. She has no rales.  Abdominal: Soft. Bowel sounds are normal. She exhibits no distension and no mass. There is no tenderness. There is no rebound and no guarding.  Musculoskeletal: She exhibits edema.       Right shoulder: Normal.       Left shoulder: Normal.       Cervical back: Normal.  Lymphadenopathy:    She has no cervical adenopathy.  Neurological: She  is alert and oriented to person, place, and time. She has normal reflexes. No cranial nerve deficit. She exhibits normal muscle tone. Coordination normal.  Skin: Skin is warm and dry. No rash noted. She is not diaphoretic. No erythema. No pallor.  Psychiatric: She has a normal mood and affect. Her behavior is normal. Judgment and thought content normal.  Nursing note and vitals reviewed.  Dg Chest 2 View  08/15/2015  CLINICAL DATA:  Shortness of breath with exertion. Lower extremity swelling. EXAM: CHEST  2 VIEW COMPARISON:  06/25/2015 FINDINGS: Heart size is at the upper limits of normal. The aorta is ectatic. The pulmonary vascularity is normal. The lungs are clear. No effusions. No bone abnormality. IMPRESSION: Borderline cardiomegaly. Ectatic aorta. No chest radiographic evidence of congestive heart failure. Electronically Signed   By: Nelson Chimes M.D.   On: 08/15/2015 18:22        Assessment & Plan:   1. Dyspnea   2. Essential hypertension, benign   3. Thyroid nodule   4. Essential hypertension   5. Seasonal  allergic rhinitis due to pollen    -New. -obtain labs. -counseled extensively regarding non-compliance and related co-morbidities of years of uncontrolled HTN including AMI, CVA, renal failure, vascular dementia. -refer for thyroid US. -treat allergies; some wheezing may be due to allergic induced asthma; no wheezing on exam today; thus, will treat uncontrolled HTN and reevaluate SOB/DOE/wheezing.  Obtain BNP. -stop Lisinopril due to patient's concern of angioedema.  Switch to Losartan -HCTZ 100-25 and Amlodipine. Continue Atenolol yet consider switching to Carvedilol or Metoprolol in future. -start Claritin.   Orders Placed This Encounter  Procedures  . DG Chest 2 View    Standing Status: Future     Number of Occurrences: 1     Standing Expiration Date: 08/14/2016    Order Specific Question:  Reason for Exam (SYMPTOM  OR DIAGNOSIS REQUIRED)    Answer:  DOE, uncontrolled  blood pressure, swelling    Order Specific Question:  Is the patient pregnant?    Answer:  No    Order Specific Question:  Preferred imaging location?    Answer:  External  . Comprehensive metabolic panel  . TSH  . T4, free  . Brain natriuretic peptide  . POCT urinalysis dipstick  . POCT CBC  . POCT glucose (manual entry)  . EKG 12-Lead   Meds ordered this encounter  Medications  . DISCONTD: losartan-hydrochlorothiazide (HYZAAR) 100-25 MG tablet    Sig: Take 1 tablet by mouth daily.    Dispense:  90 tablet    Refill:  1  . DISCONTD: amLODipine (NORVASC) 10 MG tablet    Sig: Take 1 tablet (10 mg total) by mouth daily.    Dispense:  90 tablet    Refill:  3  . losartan-hydrochlorothiazide (HYZAAR) 100-25 MG tablet    Sig: Take 1 tablet by mouth daily.    Dispense:  90 tablet    Refill:  3  . DISCONTD: atenolol (TENORMIN) 100 MG tablet    Sig: Take 1 tablet (100 mg total) by mouth daily.    Dispense:  90 tablet    Refill:  1  . amLODipine (NORVASC) 10 MG tablet    Sig: Take 1 tablet (10 mg total) by mouth daily.    Dispense:  30 tablet    Refill:  0  . atenolol (TENORMIN) 100 MG tablet    Sig: Take 1 tablet (100 mg total) by mouth daily.    Dispense:  30 tablet    Refill:  0  . loratadine (CLARITIN) 10 MG tablet    Sig: Take 1 tablet (10 mg total) by mouth daily.    Dispense:  30 tablet    Refill:  11    Return in about 1 week (around 08/22/2015) for recheck.    Oney Tatlock Elayne Guerin, M.D. [facility] [address] [place], [place]  [address] [phone] phone [phone] fax

## 2015-08-16 LAB — COMPREHENSIVE METABOLIC PANEL
ALK PHOS: 64 U/L (ref 33–115)
ALT: 33 U/L — AB (ref 6–29)
AST: 40 U/L — AB (ref 10–35)
Albumin: 4.4 g/dL (ref 3.6–5.1)
BILIRUBIN TOTAL: 1.4 mg/dL — AB (ref 0.2–1.2)
BUN: 13 mg/dL (ref 7–25)
CALCIUM: 9.3 mg/dL (ref 8.6–10.2)
CO2: 26 mmol/L (ref 20–31)
CREATININE: 0.82 mg/dL (ref 0.50–1.10)
Chloride: 101 mmol/L (ref 98–110)
GLUCOSE: 83 mg/dL (ref 65–99)
Potassium: 3.8 mmol/L (ref 3.5–5.3)
SODIUM: 137 mmol/L (ref 135–146)
Total Protein: 7.5 g/dL (ref 6.1–8.1)

## 2015-08-16 LAB — TSH: TSH: 1.11 mIU/L

## 2015-08-16 LAB — BRAIN NATRIURETIC PEPTIDE: Brain Natriuretic Peptide: 25.9 pg/mL (ref <100)

## 2015-08-16 LAB — T4, FREE: Free T4: 1.3 ng/dL (ref 0.8–1.8)

## 2015-08-22 ENCOUNTER — Telehealth: Payer: Self-pay

## 2015-08-22 NOTE — Telephone Encounter (Signed)
Pt was told to come back in today and get her BP checked but she is having to work over and won't be able to, will come one day next week, also went on line to check her blood work and couldn't Would like someone to go over it with her. Please call [phone]

## 2015-08-25 ENCOUNTER — Encounter: Payer: Self-pay | Admitting: Family Medicine

## 2015-08-25 MED ORDER — ATENOLOL 100 MG PO TABS: 100.0000 mg | ORAL_TABLET | Freq: Every day | ORAL | Status: DC

## 2015-08-25 MED ORDER — AMLODIPINE BESYLATE 10 MG PO TABS: 10.0000 mg | ORAL_TABLET | Freq: Every day | ORAL | Status: DC

## 2015-08-25 MED ORDER — OMEPRAZOLE 20 MG PO CPDR: 20.0000 mg | DELAYED_RELEASE_CAPSULE | Freq: Every day | ORAL | Status: DC

## 2015-08-25 NOTE — Addendum Note (Signed)
Addended by: Wardell Honour on: 08/25/2015 04:27 PM   Modules accepted: Orders

## 2015-08-29 ENCOUNTER — Ambulatory Visit (INDEPENDENT_AMBULATORY_CARE_PROVIDER_SITE_OTHER): Payer: Managed Care, Other (non HMO) | Admitting: Family Medicine

## 2015-08-29 VITALS — BP 162/92 | HR 53 | Temp 98.7°F | Resp 18 | Ht 62.0 in | Wt 194.8 lb

## 2015-08-29 DIAGNOSIS — R945 Abnormal results of liver function studies: Secondary | ICD-10-CM

## 2015-08-29 DIAGNOSIS — I10 Essential (primary) hypertension: Secondary | ICD-10-CM | POA: Diagnosis not present

## 2015-08-29 DIAGNOSIS — E049 Nontoxic goiter, unspecified: Secondary | ICD-10-CM

## 2015-08-29 DIAGNOSIS — J301 Allergic rhinitis due to pollen: Secondary | ICD-10-CM | POA: Diagnosis not present

## 2015-08-29 DIAGNOSIS — R7989 Other specified abnormal findings of blood chemistry: Secondary | ICD-10-CM

## 2015-08-29 DIAGNOSIS — K219 Gastro-esophageal reflux disease without esophagitis: Secondary | ICD-10-CM | POA: Diagnosis not present

## 2015-08-29 MED ORDER — AMLODIPINE BESYLATE 10 MG PO TABS: 10.0000 mg | ORAL_TABLET | Freq: Every day | ORAL | Status: DC

## 2015-08-29 MED ORDER — OMEPRAZOLE 20 MG PO CPDR: 20.0000 mg | DELAYED_RELEASE_CAPSULE | Freq: Every day | ORAL | Status: DC

## 2015-08-29 MED ORDER — ATENOLOL 100 MG PO TABS: 100.0000 mg | ORAL_TABLET | Freq: Every day | ORAL | Status: DC

## 2015-08-29 MED ORDER — ALBUTEROL SULFATE HFA 108 (90 BASE) MCG/ACT IN AERS: 2.0000 | INHALATION_SPRAY | Freq: Four times a day (QID) | RESPIRATORY_TRACT | Status: DC | PRN

## 2015-08-29 MED ORDER — LORATADINE 10 MG PO TABS: 10.0000 mg | ORAL_TABLET | Freq: Every day | ORAL | Status: DC

## 2015-08-29 NOTE — Progress Notes (Signed)
Subjective:    Patient ID: [name], female    DOB: 03-27-1971, 45 y.o.   MRN: [id number]  08/29/2015  Follow-up   HPI This 45 y.o. female presents for evaluation of hypertension.  Patient reports good compliance with medication, good tolerance to medication, and good symptom control.  Has not been checking at home.  No chest pain; still wheezing a little bit at night.  With walking, less DOE.  Getting better. No DASH diet yet; cutting back a little bit.  Eating and snacking throughout the day.  Eating out with friends at work.  Rushing. No leg swelling.  Considering Herbal Life.  No orthopnea.  Wheezing is much better.    No rhinorrhea and and nasal congestion much better.  Taking four medications.  Daughter with asthma; son might have asthma.  No coughing.  Hoarseness persistent.  Has not started Omeprazole.    Elevated LFTs: one beer Monday.     Review of Systems  Constitutional: Negative for fever, chills, diaphoresis and fatigue.  HENT: Positive for voice change. Negative for congestion, ear pain, postnasal drip, rhinorrhea, sinus pressure, sneezing and sore throat.   Eyes: Negative for visual disturbance.  Respiratory: Negative for cough and shortness of breath.   Cardiovascular: Negative for chest pain, palpitations and leg swelling.  Gastrointestinal: Negative for nausea, vomiting, abdominal pain, diarrhea and constipation.  Endocrine: Negative for cold intolerance, heat intolerance, polydipsia, polyphagia and polyuria.  Neurological: Negative for dizziness, tremors, seizures, syncope, facial asymmetry, speech difficulty, weakness, light-headedness, numbness and headaches.    Past Medical History  Diagnosis Date  . Hypertension   . Blood transfusion without reported diagnosis   . GERD (gastroesophageal reflux disease)    Past Surgical History  Procedure Laterality Date  . Endometrial ablation     No Known Allergies  Social History   Social History  . Marital  Status: Single    Spouse Name: N/A  . Number of Children: N/A  . Years of Education: N/A   Occupational History  . Not on file.   Social History Main Topics  . Smoking status: Never Smoker   . Smokeless tobacco: Never Used  . Alcohol Use: 6.0 oz/week    5 Cans of beer, 5 Shots of liquor per week     Comment: just on weekends  . Drug Use: No  . Sexual Activity: Yes    Birth Control/ Protection: Surgical   Other Topics Concern  . Not on file   Social History Narrative   Marital status: single; not dating      Children: 4 children (25, 28, 53, 34), 1 grandchild      Lives: with 2 children      Employment:  Works at [employer]      Tobacco:  None      Alcohol: 3 drinks per week; usually weekends      Drugs: none      Exercise: none               Family History  Problem Relation Age of Onset  . Hypertension Mother   . Hypertension Father   . Diabetes Father   . Diabetes Sister   . Hypertension Sister   . Hypertension Brother   . Hypertension Son   . Cancer Paternal Aunt   . Diabetes Maternal Grandmother   . Hypertension Maternal Grandmother   . Diabetes Paternal Grandmother   . Hypertension Paternal Grandmother   . Diabetes Sister  Objective:    BP 162/92 mmHg  Pulse 53  Temp(Src) 98.7 F (37.1 C) (Oral)  Resp 18  Ht 5\' 2"  (1.575 m)  Wt 194 lb 12.8 oz (88.361 kg)  BMI 35.62 kg/m2  SpO2 97%  LMP 08/23/2015 Physical Exam  Constitutional: She is oriented to person, place, and time. She appears well-developed and well-nourished. No distress.  HENT:  Head: Normocephalic and atraumatic.  Right Ear: External ear normal.  Left Ear: External ear normal.  Nose: Nose normal.  Mouth/Throat: Oropharynx is clear and moist.  Eyes: Conjunctivae and EOM are normal. Pupils are equal, round, and reactive to light.  Neck: Normal range of motion. Neck supple. Carotid bruit is not present. No thyromegaly present.  Cardiovascular: Normal rate, regular  rhythm, normal heart sounds and intact distal pulses.  Exam reveals no gallop and no friction rub.   No murmur heard. Pulmonary/Chest: Effort normal and breath sounds normal. She has no wheezes. She has no rales.  Abdominal: Soft. Bowel sounds are normal. She exhibits no distension and no mass. There is no tenderness. There is no rebound and no guarding.  Lymphadenopathy:    She has no cervical adenopathy.  Neurological: She is alert and oriented to person, place, and time. No cranial nerve deficit.  Skin: Skin is warm and dry. No rash noted. She is not diaphoretic. No erythema. No pallor.  Psychiatric: She has a normal mood and affect. Her behavior is normal.   Results for orders placed or performed in visit on 08/15/15  Comprehensive metabolic panel  Result Value Ref Range   Sodium 137 135 - 146 mmol/L   Potassium 3.8 3.5 - 5.3 mmol/L   Chloride 101 98 - 110 mmol/L   CO2 26 20 - 31 mmol/L   Glucose, Bld 83 65 - 99 mg/dL   BUN 13 7 - 25 mg/dL   Creat 0.82 0.50 - 1.10 mg/dL   Total Bilirubin 1.4 (H) 0.2 - 1.2 mg/dL   Alkaline Phosphatase 64 33 - 115 U/L   AST 40 (H) 10 - 35 U/L   ALT 33 (H) 6 - 29 U/L   Total Protein 7.5 6.1 - 8.1 g/dL   Albumin 4.4 3.6 - 5.1 g/dL   Calcium 9.3 8.6 - 10.2 mg/dL  TSH  Result Value Ref Range   TSH 1.11 mIU/L  T4, free  Result Value Ref Range   Free T4 1.3 0.8 - 1.8 ng/dL  Brain natriuretic peptide  Result Value Ref Range   Brain Natriuretic Peptide 25.9 <100 pg/mL  POCT urinalysis dipstick  Result Value Ref Range   Color, UA yellow yellow   Clarity, UA clear clear   Glucose, UA negative negative   Bilirubin, UA negative negative   Ketones, POC UA trace (5) (A) negative   Spec Grav, UA 1.020    Blood, UA negative negative   pH, UA 5.5    Protein Ur, POC trace (A) negative   Urobilinogen, UA 0.2    Nitrite, UA Negative Negative   Leukocytes, UA Negative Negative  POCT CBC  Result Value Ref Range   WBC 8.1 4.6 - 10.2 K/uL   Lymph, poc  2.0 0.6 - 3.4   POC LYMPH PERCENT 24.9 10 - 50 %L   MID (cbc) 0.2 0 - 0.9   POC MID % 2.9 0 - 12 %M   POC Granulocyte 5.8 2 - 6.9   Granulocyte percent 72.2 37 - 80 %G   RBC 4.47 4.04 - 5.48 M/uL  Hemoglobin 13.5 12.2 - 16.2 g/dL   HCT, POC 39.4 37.7 - 47.9 %   MCV 88.2 80 - 97 fL   MCH, POC 30.3 27 - 31.2 pg   MCHC 34.3 31.8 - 35.4 g/dL   RDW, POC 14.9 %   Platelet Count, POC 202 142 - 424 K/uL   MPV 8.4 0 - 99.8 fL  POCT glucose (manual entry)  Result Value Ref Range   POC Glucose 93 70 - 99 mg/dl       Assessment & Plan:   1. Essential hypertension   2. Gastroesophageal reflux disease without esophagitis   3. Allergic rhinitis due to pollen   4. Elevated LFTs    -improved control of HTN; refills provided. -advised to start Omeprazole daily. -rx for Albuterol HFA provided to use prior to exertion/exercise or for wheezing. -scheduled for thyroid US.   Orders Placed This Encounter  Procedures  . Comprehensive metabolic panel  . Acute Hep Panel & Hep B Surface Ab   Meds ordered this encounter  Medications  . omeprazole (PRILOSEC) 20 MG capsule    Sig: Take 1 capsule (20 mg total) by mouth daily.    Dispense:  90 capsule    Refill:  3  . loratadine (CLARITIN) 10 MG tablet    Sig: Take 1 tablet (10 mg total) by mouth daily.    Dispense:  90 tablet    Refill:  3  . atenolol (TENORMIN) 100 MG tablet    Sig: Take 1 tablet (100 mg total) by mouth daily.    Dispense:  90 tablet    Refill:  3  . amLODipine (NORVASC) 10 MG tablet    Sig: Take 1 tablet (10 mg total) by mouth daily.    Dispense:  90 tablet    Refill:  3  . albuterol (PROVENTIL HFA;VENTOLIN HFA) 108 (90 Base) MCG/ACT inhaler    Sig: Inhale 2 puffs into the lungs every 6 (six) hours as needed.    Dispense:  1 Inhaler    Refill:  1    No Follow-up on file.    Drago Hammonds Elayne Guerin, M.D. [facility] [address] [place], [place]  [address] [phone] phone [phone] fax

## 2015-08-29 NOTE — Patient Instructions (Addendum)
   IF you received an x-ray today, you will receive an invoice from [facility]. Please contact [facility] at [phone] with questions or concerns regarding your invoice.   IF you received labwork today, you will receive an invoice from [facility]. Please contact [facility] at [phone] with questions or concerns regarding your invoice.   Our billing staff will not be able to assist you with questions regarding bills from these companies.  You will be contacted with the lab results as soon as they are available. The fastest way to get your results is to activate your My Chart account. Instructions are located on the last page of this paperwork. If you have not heard from us regarding the results in 2 weeks, please contact this office.    Managing Your High Blood Pressure Blood pressure is a measurement of how forceful your blood is pressing against the walls of the arteries. Arteries are muscular tubes within the circulatory system. Blood pressure does not stay the same. Blood pressure rises when you are active, excited, or nervous; and it lowers during sleep and relaxation. If the numbers measuring your blood pressure stay above normal most of the time, you are at risk for health problems. High blood pressure (hypertension) is a long-term (chronic) condition in which blood pressure is elevated. A blood pressure reading is recorded as two numbers, such as 120 over 80 (or 120/80). The first, higher number is called the systolic pressure. It is a measure of the pressure in your arteries as the heart beats. The second, lower number is called the diastolic pressure. It is a measure of the pressure in your arteries as the heart relaxes between beats.  Keeping your blood pressure in a normal range is important to your overall health and prevention of health problems, such as heart disease and stroke. When your blood pressure is uncontrolled, your heart has  to work harder than normal. High blood pressure is a very common condition in adults because blood pressure tends to rise with age. Men and women are equally likely to have hypertension but at different times in life. Before age 45, men are more likely to have hypertension. After 46 years of age, women are more likely to have it. Hypertension is especially common in African Americans. This condition often has no signs or symptoms. The cause of the condition is usually not known. Your caregiver can help you come up with a plan to keep your blood pressure in a normal, healthy range. BLOOD PRESSURE STAGES Blood pressure is classified into four stages: normal, prehypertension, stage 1, and stage 2. Your blood pressure reading will be used to determine what type of treatment, if any, is necessary. Appropriate treatment options are tied to these four stages:  Normal  Systolic pressure (mm Hg): below 120.  Diastolic pressure (mm Hg): below 80. Prehypertension  Systolic pressure (mm Hg): 120 to 139.  Diastolic pressure (mm Hg): 80 to 89. Stage1  Systolic pressure (mm Hg): 140 to 159.  Diastolic pressure (mm Hg): 90 to 99. Stage2  Systolic pressure (mm Hg): 160 or above.  Diastolic pressure (mm Hg): 100 or above. RISKS RELATED TO HIGH BLOOD PRESSURE Managing your blood pressure is an important responsibility. Uncontrolled high blood pressure can lead to:  A heart attack.  A stroke.  A weakened blood vessel (aneurysm).  Heart failure.  Kidney damage.  Eye damage.  Metabolic syndrome.  Memory and concentration problems. HOW TO MANAGE YOUR BLOOD PRESSURE Blood   pressure can be managed effectively with lifestyle changes and medicines (if needed). Your caregiver will help you come up with a plan to bring your blood pressure within a normal range. Your plan should include the following: Education  Read all information provided by your caregivers about how to control blood  pressure.  Educate yourself on the latest guidelines and treatment recommendations. New research is always being done to further define the risks and treatments for high blood pressure. Lifestylechanges  Control your weight.  Avoid smoking.  Stay physically active.  Reduce the amount of salt in your diet.  Reduce stress.  Control any chronic conditions, such as high cholesterol or diabetes.  Reduce your alcohol intake. Medicines  Several medicines (antihypertensive medicines) are available, if needed, to bring blood pressure within a normal range. Communication  Review all the medicines you take with your caregiver because there may be side effects or interactions.  Talk with your caregiver about your diet, exercise habits, and other lifestyle factors that may be contributing to high blood pressure.  See your caregiver regularly. Your caregiver can help you create and adjust your plan for managing high blood pressure. RECOMMENDATIONS FOR TREATMENT AND FOLLOW-UP  The following recommendations are based on current guidelines for managing high blood pressure in nonpregnant adults. Use these recommendations to identify the proper follow-up period or treatment option based on your blood pressure reading. You can discuss these options with your caregiver.  Systolic pressure of 120 to 139 or diastolic pressure of 80 to 89: Follow up with your caregiver as directed.  Systolic pressure of 140 to 160 or diastolic pressure of 90 to 100: Follow up with your caregiver within 2 months.  Systolic pressure above 160 or diastolic pressure above 100: Follow up with your caregiver within 1 month.  Systolic pressure above 180 or diastolic pressure above 110: Consider antihypertensive therapy; follow up with your caregiver within 1 week.  Systolic pressure above 200 or diastolic pressure above 120: Begin antihypertensive therapy; follow up with your caregiver within 1 week.   This information is  not intended to replace advice given to you by your health care provider. Make sure you discuss any questions you have with your health care provider.   Document Released: 01/07/2012 Document Reviewed: 01/07/2012 Elsevier Interactive Patient Education 2016 [employer].  

## 2015-08-30 LAB — COMPREHENSIVE METABOLIC PANEL
ALK PHOS: 70 U/L (ref 33–115)
ALT: 38 U/L — AB (ref 6–29)
AST: 52 U/L — AB (ref 10–35)
Albumin: 4.2 g/dL (ref 3.6–5.1)
BUN: 13 mg/dL (ref 7–25)
CO2: 29 mmol/L (ref 20–31)
CREATININE: 0.82 mg/dL (ref 0.50–1.10)
Calcium: 9.2 mg/dL (ref 8.6–10.2)
Chloride: 98 mmol/L (ref 98–110)
GLUCOSE: 89 mg/dL (ref 65–99)
POTASSIUM: 3.2 mmol/L — AB (ref 3.5–5.3)
SODIUM: 139 mmol/L (ref 135–146)
Total Bilirubin: 0.8 mg/dL (ref 0.2–1.2)
Total Protein: 7.2 g/dL (ref 6.1–8.1)

## 2015-08-30 LAB — ACUTE HEP PANEL AND HEP B SURFACE AB
HCV AB: NEGATIVE
HEP A IGM: NONREACTIVE
Hep B C IgM: NONREACTIVE
Hep B S Ab: POSITIVE — AB
Hepatitis B Surface Ag: NEGATIVE

## 2015-08-31 ENCOUNTER — Ambulatory Visit
Admission: RE | Admit: 2015-08-31 | Discharge: 2015-08-31 | Disposition: A | Discharge status: Home / Self Care | Payer: Managed Care, Other (non HMO) | Source: Ambulatory Visit | Attending: Family Medicine | Admitting: Family Medicine

## 2015-08-31 DIAGNOSIS — E041 Nontoxic single thyroid nodule: Secondary | ICD-10-CM

## 2015-09-05 ENCOUNTER — Telehealth: Payer: Self-pay

## 2015-09-05 NOTE — Telephone Encounter (Signed)
Please review

## 2015-09-05 NOTE — Telephone Encounter (Signed)
Pt is needing lab results   Best number [phone]

## 2015-09-07 NOTE — Telephone Encounter (Signed)
Patient called again for her labs regarding to her thyroid.    Please call [phone]

## 2015-09-09 ENCOUNTER — Other Ambulatory Visit: Payer: Self-pay | Admitting: Family Medicine

## 2015-09-09 DIAGNOSIS — E041 Nontoxic single thyroid nodule: Secondary | ICD-10-CM

## 2015-09-09 MED ORDER — POTASSIUM CHLORIDE CRYS ER 20 MEQ PO TBCR: 20.0000 meq | EXTENDED_RELEASE_TABLET | Freq: Every day | ORAL | Status: DC

## 2015-09-10 ENCOUNTER — Other Ambulatory Visit: Payer: Self-pay | Admitting: Family Medicine

## 2015-09-10 DIAGNOSIS — E041 Nontoxic single thyroid nodule: Secondary | ICD-10-CM

## 2015-09-10 NOTE — Telephone Encounter (Signed)
Pt was made aware of labs results by [facility] F. @ 8:48 this morning

## 2015-09-19 ENCOUNTER — Ambulatory Visit
Admission: RE | Admit: 2015-09-19 | Discharge: 2015-09-19 | Disposition: A | Discharge status: Home / Self Care | Payer: Managed Care, Other (non HMO) | Source: Ambulatory Visit | Attending: Family Medicine | Admitting: Family Medicine

## 2015-09-19 ENCOUNTER — Telehealth: Payer: Self-pay

## 2015-09-19 ENCOUNTER — Other Ambulatory Visit (HOSPITAL_COMMUNITY)
Admission: RE | Admit: 2015-09-19 | Discharge: 2015-09-19 | Disposition: A | Discharge status: Home / Self Care | Payer: Managed Care, Other (non HMO) | Source: Ambulatory Visit | Attending: Interventional Radiology | Admitting: Interventional Radiology

## 2015-09-19 DIAGNOSIS — E041 Nontoxic single thyroid nodule: Secondary | ICD-10-CM

## 2015-09-19 DIAGNOSIS — E042 Nontoxic multinodular goiter: Secondary | ICD-10-CM | POA: Diagnosis not present

## 2015-09-19 NOTE — Telephone Encounter (Signed)
The patient called to follow up on an order for an ultrasound of her liver.  I do not see any orders placed for this.  Please advise, thank you.  CB#: [phone]

## 2015-09-19 NOTE — Telephone Encounter (Signed)
Patient aware of results via phone.. Patient had Biopsy of her Thyroid done today and was made aware that it may take up 4 days to get results back

## 2015-09-19 NOTE — Telephone Encounter (Signed)
Notes Recorded by Wardell Honour, MD on 09/09/2015 at 3:25 PM 1. No evidence of hepatitis A, B, or C. Blood work shows that you are immune to hepatitis B.  2. Potassium level is slightly low; I have sent in a potassium supplement for you to take one daily for one week.  3. Liver function tests remain elevated; recommend avoiding tylenol containing products and alcohol. If remains elevated at next visit, will recommend undergoing an abdominal ultrasound to visualize the liver.  4. Kidney function is normal.

## 2015-10-03 ENCOUNTER — Telehealth: Payer: Self-pay

## 2015-10-03 DIAGNOSIS — E042 Nontoxic multinodular goiter: Secondary | ICD-10-CM

## 2015-10-03 DIAGNOSIS — E041 Nontoxic single thyroid nodule: Secondary | ICD-10-CM

## 2015-10-03 NOTE — Telephone Encounter (Signed)
Dr. Tamala Julian Pt wanted to know since her biopsy came back neg. She is still concerned that the lump is still there and swollen. She says she feels like her voice has been changing too. She wanted to know should she come back in and discuss this further with you, and she also stated she was not able to get her PE when she came in that day because of her blood pressure if she can come in sooner than Oct. To have that done

## 2015-10-05 NOTE — Telephone Encounter (Signed)
Spoke with pt and informed her of the advise and referral

## 2015-10-05 NOTE — Telephone Encounter (Signed)
Call --- I would recommend referral to endocrinology/thyroid specialist for further discussion and evaluation.  I have placed an order for the endocrinology referral.

## 2015-11-19 ENCOUNTER — Encounter: Payer: Self-pay | Admitting: Endocrinology

## 2015-11-19 ENCOUNTER — Ambulatory Visit (INDEPENDENT_AMBULATORY_CARE_PROVIDER_SITE_OTHER): Payer: Managed Care, Other (non HMO) | Admitting: Endocrinology

## 2015-11-19 VITALS — BP 193/115 | HR 58 | Temp 98.5°F | Ht 62.0 in | Wt 97.6 lb

## 2015-11-19 DIAGNOSIS — J4540 Moderate persistent asthma, uncomplicated: Secondary | ICD-10-CM

## 2015-11-19 DIAGNOSIS — R0602 Shortness of breath: Secondary | ICD-10-CM

## 2015-11-19 DIAGNOSIS — J45909 Unspecified asthma, uncomplicated: Secondary | ICD-10-CM | POA: Insufficient documentation

## 2015-11-19 NOTE — Progress Notes (Signed)
Subjective:    Patient ID: [name], female    DOB: [birth year], 45 y.o.   MRN: [id number]  HPI Pt was noted to have slight swelling at the anterior neck in 2010, but she did not have sxs evaluated until 2017.  He has no h/o XRT or surgery to the neck.  He had bilat bxs.  She has 1 year of moderate hoarseness, and assoc heartburn.   Past Medical History:  Diagnosis Date  . Blood transfusion without reported diagnosis   . GERD (gastroesophageal reflux disease)   . Hypertension     Past Surgical History:  Procedure Laterality Date  . ENDOMETRIAL ABLATION      Social History   Social History  . Marital status: Single    Spouse name: N/A  . Number of children: N/A  . Years of education: N/A   Occupational History  . Not on file.   Social History Main Topics  . Smoking status: Never Smoker  . Smokeless tobacco: Never Used  . Alcohol use 6.0 oz/week    5 Cans of beer, 5 Shots of liquor per week     Comment: just on weekends  . Drug use: No  . Sexual activity: Yes    Birth control/ protection: Surgical   Other Topics Concern  . Not on file   Social History Narrative   Marital status: single; not dating      Children: 4 children (25, 35, 58, 1), 1 grandchild      Lives: with 2 children      Employment:  Works at [employer]      Tobacco:  None      Alcohol: 3 drinks per week; usually weekends      Drugs: none      Exercise: none                Current Outpatient Prescriptions on File Prior to Visit  Medication Sig Dispense Refill  . atenolol (TENORMIN) 100 MG tablet Take 1 tablet (100 mg total) by mouth daily. 90 tablet 3  . loratadine (CLARITIN) 10 MG tablet Take 1 tablet (10 mg total) by mouth daily. 90 tablet 3  . losartan-hydrochlorothiazide (HYZAAR) 100-25 MG tablet Take 1 tablet by mouth daily. 90 tablet 3  . omeprazole (PRILOSEC) 20 MG capsule Take 1 capsule (20 mg total) by mouth daily. 90 capsule 3  . potassium chloride SA (K-DUR,KLOR-CON)  20 MEQ tablet Take 1 tablet (20 mEq total) by mouth daily. 7 tablet 0  . albuterol (PROVENTIL HFA;VENTOLIN HFA) 108 (90 Base) MCG/ACT inhaler Inhale 2 puffs into the lungs every 6 (six) hours as needed. (Patient not taking: Reported on 11/19/2015) 1 Inhaler 1  . amLODipine (NORVASC) 10 MG tablet Take 1 tablet (10 mg total) by mouth daily. (Patient not taking: Reported on 11/19/2015) 90 tablet 3   No current facility-administered medications on file prior to visit.     No Known Allergies  Family History  Problem Relation Age of Onset  . Hypertension Mother   . Hypertension Father   . Diabetes Father   . Diabetes Sister   . Hypertension Sister   . Hypertension Brother   . Hypertension Son   . Cancer Paternal Aunt   . Diabetes Maternal Grandmother   . Hypertension Maternal Grandmother   . Diabetes Paternal Grandmother   . Hypertension Paternal Grandmother   . Diabetes Sister     BP (!) 193/115 Comment: Patient states she just took medications for  the day  Pulse (!) 58   Temp 98.5 F (36.9 C) (Oral)   Ht 5\' 2"  (1.575 m)   Wt 97 lb 9.6 oz (44.3 kg)   LMP 10/24/2015   SpO2 99%   BMI 17.85 kg/m    Review of Systems Denies neck pain, visual loss, chest pain, sob, cough, skin rash, easy bruising, depression, cold intolerance, headache, numbness, and rhinorrhea.  She has weight gain.     Objective:   Physical Exam VS: see vs page. GEN: no distress. HEAD: head: no deformity.  eyes: no periorbital swelling; slight bilat proptosis.  external nose and ears are normal mouth: no lesion seen NECK: bilat nodules (R is bigger and higher than L), but both are approx 4 cm CHEST WALL: no deformity LUNGS: clear to auscultation CV: reg rate and rhythm, no murmur.  ABD: abdomen is soft, nontender.  no hepatosplenomegaly.  not distended.  no hernia MUSCULOSKELETAL: muscle bulk and strength are grossly normal.  no obvious joint swelling.  gait is normal and steady EXTEMITIES: no deformity.   no ulcer on the feet.  feet are of normal color and temp.  no edema PULSES: dorsalis pedis intact bilat.  no carotid bruit NEURO:  cn 2-12 grossly intact.   readily moves all 4's.  sensation is intact to touch on the feet SKIN:  Normal texture and temperature.  No rash or suspicious lesion is visible.   NODES:  None palpable at the neck PSYCH: alert, well-oriented.  Does not appear anxious nor depressed.   Lab Results  Component Value Date   TSH 1.11 08/15/2015   [place]: multi nodular goiter. The dominant approximately 3.5 cm nodule/mass within the right lobe of the thyroid as well as the approximately 2.4 cm nodule within the left lobe  Cytology: BETHESDA CATEGORY II.  I have reviewed outside records, and summarized:  Pt was noted in 2016 to have normal vocal cord mobility on direct laryngoscopy.   i personally reviewed spirometry tracing (today):  Indication: dyspnea.  Impression: severe obstruction.      Assessment & Plan:  multinodular goiter, usually hereditary.  Low risk for malignancy, per cytology. HTN: worse: pt is advised to have rechecked with Dr Tamala Julian later this week.  Hoarseness: in view of normal laryngoscopy, not thyroid-related.  Possibly related to GERD. Severe obstructive lung dz: new.  Patient is advised the following: Patient Instructions  Please see a lung specialist.  you will receive a phone call, about a day and time for an appointment.   Please come back for a follow-up appointment in 6 months.   Please call or come back sooner, if you think the nodules are enlarging.   Renato Shin, MD

## 2015-11-19 NOTE — Patient Instructions (Addendum)
Please see a lung specialist.  you will receive a phone call, about a day and time for an appointment.   Please come back for a follow-up appointment in 6 months.   Please call or come back sooner, if you think the nodules are enlarging.

## 2015-11-21 ENCOUNTER — Telehealth: Payer: Self-pay | Admitting: Endocrinology

## 2015-11-21 NOTE — Telephone Encounter (Signed)
please call patient: Blood pressure was high when you were here. Please have it rechecked at Dr [facility] office within the next few days.

## 2015-11-21 NOTE — Telephone Encounter (Signed)
lmtrc

## 2015-11-26 NOTE — Telephone Encounter (Signed)
I contacted the pt and left a vm advising of note below. Requested a call back if the pt would like to discuss.  

## 2015-11-26 NOTE — Telephone Encounter (Signed)
See below

## 2015-12-21 ENCOUNTER — Encounter: Payer: Self-pay | Admitting: Internal Medicine

## 2015-12-21 ENCOUNTER — Ambulatory Visit (INDEPENDENT_AMBULATORY_CARE_PROVIDER_SITE_OTHER): Payer: Managed Care, Other (non HMO) | Admitting: Internal Medicine

## 2015-12-21 VITALS — BP 148/80 | HR 71 | Ht 62.0 in | Wt 198.8 lb

## 2015-12-21 DIAGNOSIS — I10 Essential (primary) hypertension: Secondary | ICD-10-CM

## 2015-12-21 DIAGNOSIS — R0600 Dyspnea, unspecified: Secondary | ICD-10-CM

## 2015-12-21 DIAGNOSIS — E042 Nontoxic multinodular goiter: Secondary | ICD-10-CM

## 2015-12-21 LAB — NITRIC OXIDE: Nitric Oxide: 24

## 2015-12-21 MED ORDER — TRIAMTERENE-HCTZ 37.5-25 MG PO CAPS: 1.0000 | ORAL_CAPSULE | Freq: Every day | ORAL | 11 refills | Status: DC

## 2015-12-21 MED ORDER — FAMOTIDINE 20 MG PO TABS: ORAL_TABLET | ORAL | 2 refills | Status: DC

## 2015-12-21 MED ORDER — PANTOPRAZOLE SODIUM 40 MG PO TBEC: 40.0000 mg | DELAYED_RELEASE_TABLET | Freq: Every day | ORAL | 2 refills | Status: DC

## 2015-12-21 MED ORDER — BISOPROLOL FUMARATE 5 MG PO TABS: 5.0000 mg | ORAL_TABLET | Freq: Every day | ORAL | 2 refills | Status: DC

## 2015-12-21 MED ORDER — MINOXIDIL 10 MG PO TABS: 10.0000 mg | ORAL_TABLET | Freq: Every day | ORAL | 2 refills | Status: DC

## 2015-12-21 NOTE — Patient Instructions (Addendum)
Pantoprazole (protonix) 40 mg   Take  30-60 min before first meal of the day and Pepcid (famotidine)  20 mg one @  bedtime until return to office - this is the best way to tell whether stomach acid is contributing to your problem.    GERD (REFLUX)  is an extremely common cause of respiratory symptoms just like yours , many times with no obvious heartburn at all.    It can be treated with medication, but also with lifestyle changes including elevation of the head of your bed (ideally with 6 inch  bed blocks),  Smoking cessation, avoidance of late meals, excessive alcohol, and avoid fatty foods, chocolate, peppermint, colas, red wine, and acidic juices such as orange juice.  NO MINT OR MENTHOL PRODUCTS SO NO COUGH DROPS   USE SUGARLESS CANDY INSTEAD (Jolley ranchers or Stover's or Life Savers) or even ice chips will also do - the key is to swallow to prevent all throat clearing. NO OIL BASED VITAMINS - use powdered substitutes.   Stop losartan / norvasc / tenormin   Start bisoprolol 5 mg daily / minoxidil 10 mg dialy and maxzide 25 one daily    Return to [facility] in 2 weeks for full pfts then return here after they are done for ov (ok to add on)     .

## 2015-12-21 NOTE — Progress Notes (Signed)
Subjective:    Patient ID: [name], female    DOB: Jan 31, 1971,    MRN: [id number]  HPI  52 yobf never smoker healthy  X for hbp s resp issues including IUP's last pregancy 2000 with new onset hoarseness osnet  summer 2016 and worse sob since winter of 2016 failed rx for allergies and reflux with neg ent eval ('just reflux") while on ACEi finally stopped around 07/2015 but not better so referred to pulmonary clinic 12/21/2015 by Dr Loanne Drilling     12/21/2015 1st [facility] office visit/ [name]  On high dose tenormin, norvasc/ losartan Chief Complaint  Patient presents with  . Pulmonary Consult    Referred by Dr. Loanne Drilling. Pt c/o change in her voice and SOB for the past year.  She gets SOB when she is lying down and when she walks up stairs.   onset of hoarseness first then doe indolent, persistent, daily  MMRC2 = can't walk a nl pace on a flat grade s sob but does fine slow and flat eg [employer]  Hear's herself wheezing at hs but doesn't keep her up at night   No obvious day to day or daytime variability or assoc excess/ purulent sputum or mucus plugs or hemoptysis or cp or chest tightness, subjective wheeze or overt sinus or hb symptoms. No unusual exp hx or h/o childhood pna/ asthma or knowledge of premature birth.  Sleeping ok without nocturnal  or early am exacerbation  of respiratory  c/o's or need for noct saba. Also denies any obvious fluctuation of symptoms with weather or environmental changes or other aggravating or alleviating factors except as outlined above   Current Medications, Allergies, Complete Past Medical History, Past Surgical History, Family History, and Social History were reviewed in [employer] record.     Review of Systems  Constitutional: Negative for chills, fever and unexpected weight change.  HENT: Positive for voice change. Negative for congestion, dental problem, ear pain, nosebleeds, postnasal drip, rhinorrhea, sinus pressure,  sneezing, sore throat and trouble swallowing.   Eyes: Negative for visual disturbance.  Respiratory: Positive for shortness of breath. Negative for cough and choking.   Cardiovascular: Negative for chest pain and leg swelling.  Gastrointestinal: Negative for abdominal pain, diarrhea and vomiting.  Genitourinary: Negative for difficulty urinating.  Musculoskeletal: Negative for arthralgias.  Skin: Negative for rash.  Neurological: Negative for tremors, syncope and headaches.  Hematological: Does not bruise/bleed easily.       Objective:   Physical Exam  Hoarse amb bf nad but prominent pseudowheeze  Wt Readings from Last 3 Encounters:  12/21/15 198 lb 12.8 oz (90.2 kg)  11/19/15 97 lb 9.6 oz (44.3 kg)  08/29/15 194 lb 12.8 oz (88.4 kg)    Vital signs reviewed  HEENT: nl dentition, turbinates, and oropharynx. Nl external ear canals without cough reflex   NECK :  without JVD/Nodes/  nl carotid upstrokes bilaterally - golf ball sized R Thyroid nodule with min tracheal deviation  LUNGS: no acc muscle use,  Nl contour chest which is clear to A and P bilaterally without cough on insp or exp maneuvers   CV:  RRR  no s3 or murmur or increase in P2, no edema   ABD:  soft and nontender with nl inspiratory excursion in the supine position. No bruits or organomegaly, bowel sounds nl  MS:  Nl gait/ ext warm without deformities, calf tenderness, cyanosis or clubbing No obvious joint restrictions   SKIN: warm and dry  without lesions    NEURO:  alert, approp, nl sensorium with  no motor deficits     I personally reviewed images and agree with radiology impression as follows:  CXR:   08/15/15  Borderline cardiomegaly. Ectatic aorta. No chest radiographic evidence of congestive heart failure.       Assessment & Plan:

## 2015-12-22 DIAGNOSIS — E042 Nontoxic multinodular goiter: Secondary | ICD-10-CM | POA: Insufficient documentation

## 2015-12-22 NOTE — Assessment & Plan Note (Addendum)
D/c lisinopril 07/2015 - spirometry 11/19/15  FEV1 1.25 (56%) and ratio 68   - d/c losartan 12/21/2015  - max rx for gerd x 2 weeks started 12/21/2015 and f/u pfts then - FENO 12/21/2015  =   24   DDX of  difficult airways management almost all start with A and  include Adherence, Ace Inhibitors, Acid Reflux, Active Sinus Disease, Alpha 1 Antitripsin deficiency, Anxiety masquerading as Airways dz,  ABPA,  Allergy(esp in young), Aspiration (esp in elderly), Adverse effects of meds,  Active smokers, A bunch of PE's (a small clot burden can't cause this syndrome unless there is already severe underlying pulm or vascular dz with poor reserve) plus two Bs  = Bronchiectasis and Beta blocker use..and one C= CHF   Adherence is always the initial "prime suspect" and is a multilayered concern that requires a "trust but verify" approach in every patient - starting with knowing how to use medications, especially inhalers, correctly, keeping up with refills and understanding the fundamental difference between maintenance and prns vs those medications only taken for a very short course and then stopped and not refilled.   ? Allergies/ asthma > spirometry suggestive but not the hx or FENO and empirical rx for asthma is likely to make the upper airway symptoms worse so will hold off for now and repeat   full pfts before and after saba in 2 weeks   ? Acid reflux  Her upper airway symptoms have proved refractory to  Date to approp rx for GERD though in retrospect it appears that this occurred while still on acei which doesn't prove anything meaningful in terms of neg resp and needs to be rechallenged with GERD RX   ? ACEi effects > For reasons that may related to vascular permability and nitric oxide pathways but not elevated  bradykinin levels (as seen with  ACEi use) losartan in the generic form has been reported now from mulitple sources  to cause a similar pattern of non-specific  upper airway symptoms as seen with  acei.   This has not been reported with exposure to the other ARB's to date, so it seems reasonable for now to try either generic diovan or avapro if ARB needed or use an alternative class altogether. In this case probably can control bp s arb (see separate a/p)  See:  Lelon Frohlich Allergy Asthma Immunol  2008: 101: p [phone]   ? Adverse effects of meds  eg high dose norvasc interferes with LES and can potentially interfere with hypoxic pulmonary vasoconstriction > try off   ? BB effects  > high dose tenormin can have spillover effects    Will re-eval in 2 weeks on new regimen   Total time devoted to counseling  = 35/52m review case with pt/ discussion of options/alternatives/ personally creating written instructions  in presence of pt  then going over those specific  Instructions directly with the pt including how to use all of the meds but in particular covering each new medication in detail and the difference between the maintenance/automatic meds and the prns using an action plan format for the latter.

## 2015-12-22 NOTE — Assessment & Plan Note (Signed)
Changed norvasc to minoxidil 12/21/2015  Changed tenormin to bisoprilol 12/21/2015  D/c losartan 12/22/2015   Not clear to what extent any of the bp meds are contributing to her chronic refractory resp symptoms so if no change in 2 weeks can always change back/ add back arb but  even in retrospect it may not be clear the ACEi contributed to the pt's symptoms,  Pt improved off them and adding them back at this point or in the future would risk confusion in interpretation of non-specific respiratory symptoms to which this patient is prone  ie  Better not to muddy the waters here.   However, given the established airflow obst on previous spirometry in this never smoker,  Strongly prefer in this setting: Bystolic, the most beta -1  selective Beta blocker available in sample form, with bisoprolol the most selective generic choice  on the market, over higher doses of any of the less specific BB on the market

## 2015-12-22 NOTE — Assessment & Plan Note (Signed)
[name] eval reviewed, appears benign with nl thyroid fxn and no obvious tracheal involvement by ent eval 01/12/15 nor VC abn's   rec obtain f/v loop after 2 weeks off arb and on max rx for gerd

## 2016-01-04 ENCOUNTER — Encounter (HOSPITAL_COMMUNITY): Payer: Managed Care, Other (non HMO)

## 2016-01-07 ENCOUNTER — Ambulatory Visit: Payer: Managed Care, Other (non HMO) | Admitting: Internal Medicine

## 2016-06-25 ENCOUNTER — Encounter (HOSPITAL_COMMUNITY): Payer: Self-pay | Admitting: *Deleted

## 2016-06-25 ENCOUNTER — Emergency Department (HOSPITAL_COMMUNITY)
Admission: EM | Admit: 2016-06-25 | Discharge: 2016-06-25 | Disposition: A | Discharge status: Home / Self Care | Payer: Managed Care, Other (non HMO) | Attending: Emergency Medicine | Admitting: Emergency Medicine

## 2016-06-25 ENCOUNTER — Emergency Department (HOSPITAL_COMMUNITY): Payer: Managed Care, Other (non HMO)

## 2016-06-25 DIAGNOSIS — I509 Heart failure, unspecified: Secondary | ICD-10-CM | POA: Insufficient documentation

## 2016-06-25 DIAGNOSIS — J45909 Unspecified asthma, uncomplicated: Secondary | ICD-10-CM | POA: Insufficient documentation

## 2016-06-25 DIAGNOSIS — I110 Hypertensive heart disease with heart failure: Secondary | ICD-10-CM | POA: Insufficient documentation

## 2016-06-25 DIAGNOSIS — Z79899 Other long term (current) drug therapy: Secondary | ICD-10-CM | POA: Insufficient documentation

## 2016-06-25 DIAGNOSIS — R0789 Other chest pain: Secondary | ICD-10-CM | POA: Insufficient documentation

## 2016-06-25 DIAGNOSIS — R079 Chest pain, unspecified: Secondary | ICD-10-CM

## 2016-06-25 LAB — URINALYSIS, ROUTINE W REFLEX MICROSCOPIC
Bilirubin Urine: NEGATIVE
Glucose, UA: NEGATIVE mg/dL
Hgb urine dipstick: NEGATIVE
Ketones, ur: NEGATIVE mg/dL
LEUKOCYTES UA: NEGATIVE
Nitrite: NEGATIVE
PROTEIN: NEGATIVE mg/dL
Specific Gravity, Urine: 1.006 (ref 1.005–1.030)
pH: 7 (ref 5.0–8.0)

## 2016-06-25 LAB — BASIC METABOLIC PANEL
Anion gap: 8 (ref 5–15)
BUN: 7 mg/dL (ref 6–20)
CHLORIDE: 100 mmol/L — AB (ref 101–111)
CO2: 28 mmol/L (ref 22–32)
Calcium: 9.6 mg/dL (ref 8.9–10.3)
Creatinine, Ser: 0.69 mg/dL (ref 0.44–1.00)
GFR calc Af Amer: >60 mL/min (ref >60)
GFR calc non Af Amer: >60 mL/min (ref >60)
GLUCOSE: 86 mg/dL (ref 65–99)
Potassium: 3.3 mmol/L — ABNORMAL LOW (ref 3.5–5.1)
Sodium: 136 mmol/L (ref 135–145)

## 2016-06-25 LAB — I-STAT BETA HCG BLOOD, ED (MC, WL, AP ONLY): I-stat hCG, quantitative: <5 m[IU]/mL (ref <5)

## 2016-06-25 LAB — I-STAT CG4 LACTIC ACID, ED
LACTIC ACID, VENOUS: 1.31 mmol/L (ref 0.5–1.9)
LACTIC ACID, VENOUS: 0.66 mmol/L (ref 0.5–1.9)

## 2016-06-25 LAB — PROTIME-INR
INR: 1.13
Prothrombin Time: 14.6 seconds (ref 11.4–15.2)

## 2016-06-25 LAB — CBC
HCT: 39.1 % (ref 36.0–46.0)
Hemoglobin: 12.6 g/dL (ref 12.0–15.0)
MCH: 29.2 pg (ref 26.0–34.0)
MCHC: 32.2 g/dL (ref 30.0–36.0)
MCV: 90.5 fL (ref 78.0–100.0)
PLATELETS: 215 10*3/uL (ref 150–400)
RBC: 4.32 MIL/uL (ref 3.87–5.11)
RDW: 14 % (ref 11.5–15.5)
WBC: 11.1 10*3/uL — ABNORMAL HIGH (ref 4.0–10.5)

## 2016-06-25 LAB — HEPATIC FUNCTION PANEL
ALT: 40 U/L (ref 14–54)
AST: 39 U/L (ref 15–41)
Albumin: 3.7 g/dL (ref 3.5–5.0)
Alkaline Phosphatase: 61 U/L (ref 38–126)
BILIRUBIN DIRECT: 0.2 mg/dL (ref 0.1–0.5)
BILIRUBIN INDIRECT: 0.9 mg/dL (ref 0.3–0.9)
Total Bilirubin: 1.1 mg/dL (ref 0.3–1.2)
Total Protein: 6.9 g/dL (ref 6.5–8.1)

## 2016-06-25 LAB — I-STAT TROPONIN, ED
Troponin i, poc: 0 ng/mL (ref 0.00–0.08)
Troponin i, poc: 0 ng/mL (ref 0.00–0.08)

## 2016-06-25 LAB — D-DIMER, QUANTITATIVE: D-Dimer, Quant: <0.27 ug/mL-FEU (ref 0.00–0.50)

## 2016-06-25 LAB — LIPASE, BLOOD: LIPASE: 31 U/L (ref 11–51)

## 2016-06-25 MED ORDER — HYDROCODONE-ACETAMINOPHEN 5-325 MG PO TABS: 1.0000 | ORAL_TABLET | Freq: Four times a day (QID) | ORAL | 0 refills | Status: DC | PRN

## 2016-06-25 MED ORDER — ASPIRIN 81 MG PO CHEW
324.0000 mg | CHEWABLE_TABLET | Freq: Once | ORAL | Status: AC
  Administered 2016-06-25: 324 mg via ORAL
  Filled 2016-06-25: qty 4

## 2016-06-25 MED ORDER — CYCLOBENZAPRINE HCL 10 MG PO TABS: 10.0000 mg | ORAL_TABLET | Freq: Two times a day (BID) | ORAL | 0 refills | Status: DC | PRN

## 2016-06-25 MED ORDER — GI COCKTAIL ~~LOC~~
30.0000 mL | Freq: Once | ORAL | Status: AC
  Administered 2016-06-25: 30 mL via ORAL
  Filled 2016-06-25: qty 30

## 2016-06-25 NOTE — ED Triage Notes (Signed)
Pt states central chest pain that now radiates to L chest and L rib cage.  Denies nv or sob.

## 2016-06-25 NOTE — Discharge Instructions (Signed)
Please take the pain medicine and muscle relaxant to help alleviate her symptoms. Your workup was reassuring in regards to a heart or lung causing her pain. Please schedule a follow-up appointment with a primary care physician for recheck and further management of her discomfort. If you develop any new symptoms or the symptoms worsen, please return to the nearest emergency department.

## 2016-06-25 NOTE — ED Provider Notes (Signed)
[facility] DEPT Provider Note   CSN: [id number] Arrival date & time: 06/25/16  1826     History   Chief Complaint Chief Complaint  Patient presents with  . Chest Pain    HPI [name] is a 46 y.o. female with a past medical history significant for hypertension and GERD who presents with chest pain, shortness of breath, productive cough, and lightheadedness. Patient reports that she woke up yesterday with a chest discomfort. She describes it as a pressure in her central chest that radiates to the left chest and under the axilla. She says it is intermittently sharper and more intense going up to an 8 out of 10 severity. It is at a baseline 6 out of 10. She says it was worse when she took a deep breath and when she moved. She said it was not associated with exertion or eating. She reports some shortness of breath and lightheadedness with it. She says that she has been having burping episodes with it. She denies any constipation, diarrhea. She does report some increase in urination but no dysuria. She denies any lower extremity pain or edema unilaterally. She denies any tenderness on her chest or recent traumatic injuries. She reports that she came to the emergency last night but left before walking and because she decided to wait and see the gallbladder. She denies any other symptoms upon arrival and denies nausea, vomiting, diaphoresis, or syncope. Medicine she has tried is Pepto-Bismol without relief.         HPI  Past Medical History:  Diagnosis Date  . Blood transfusion without reported diagnosis   . GERD (gastroesophageal reflux disease)   . Hypertension     Patient Active Problem List   Diagnosis Date Noted  . Multinodular goiter (nontoxic) 12/22/2015  . Asthma 11/19/2015  . Essential hypertension 08/29/2015  . Gastroesophageal reflux disease without esophagitis 08/29/2015  . Allergic rhinitis due to pollen 08/29/2015  . Hepatitis B immune 02/24/2014  . Acute  bronchitis 10/19/2013  . CHF exacerbation ([place]) 10/18/2013  . Dyspnea 10/18/2013  . Chest pain 10/18/2013  . Cardiomegaly 08/23/2012  . Fibroid, uterine 03/17/2012  . HEADACHE, TENSION 02/02/2007  . HYPERTENSION 02/02/2007    Past Surgical History:  Procedure Laterality Date  . ENDOMETRIAL ABLATION      OB History    No data available       Home Medications    Prior to Admission medications   Medication Sig Start Date End Date Taking? Authorizing Provider  bisoprolol (ZEBETA) 5 MG tablet Take 1 tablet (5 mg total) by mouth daily. 12/21/15   Tanda Rockers, MD  famotidine (PEPCID) 20 MG tablet One at bedtime 12/21/15   Tanda Rockers, MD  minoxidil (LONITEN) 10 MG tablet Take 1 tablet (10 mg total) by mouth daily. 12/21/15   Tanda Rockers, MD  pantoprazole (PROTONIX) 40 MG tablet Take 1 tablet (40 mg total) by mouth daily. Take 30-60 min before first meal of the day 12/21/15   Tanda Rockers, MD  triamterene-hydrochlorothiazide (DYAZIDE) 37.5-25 MG capsule Take 1 each (1 capsule total) by mouth daily. 12/21/15   Tanda Rockers, MD    Family History Family History  Problem Relation Age of Onset  . Hypertension Mother   . Hypertension Father   . Diabetes Father   . Diabetes Sister   . Hypertension Sister   . Hypertension Brother   . Hypertension Son   . Cancer Paternal Aunt   . Diabetes Maternal  Grandmother   . Hypertension Maternal Grandmother   . Diabetes Paternal Grandmother   . Hypertension Paternal Grandmother   . Diabetes Sister   . Asthma Daughter     Social History Social History  Substance Use Topics  . Smoking status: Never Smoker  . Smokeless tobacco: Never Used  . Alcohol use 6.0 oz/week    5 Cans of beer, 5 Shots of liquor per week     Comment: just on weekends     Allergies   Patient has no known allergies.   Review of Systems Review of Systems  Constitutional: Negative for chills, diaphoresis, fatigue and fever.  HENT: Negative for  congestion and rhinorrhea.   Eyes: Negative for visual disturbance.  Respiratory: Positive for cough, chest tightness and shortness of breath. Negative for wheezing and stridor.   Cardiovascular: Positive for chest pain. Negative for palpitations and leg swelling.  Gastrointestinal: Negative for abdominal pain, diarrhea, nausea and vomiting.  Genitourinary: Positive for frequency. Negative for dysuria and flank pain.  Musculoskeletal: Negative for back pain, neck pain and neck stiffness.  Skin: Negative for rash and wound.  Neurological: Positive for light-headedness. Negative for dizziness, weakness, numbness and headaches.  Psychiatric/Behavioral: Negative for agitation.  All other systems reviewed and are negative.    Physical Exam Updated Vital Signs BP (!) 239/133 (BP Location: Left Arm)   Pulse 85   Temp 98.5 F (36.9 C) (Oral)   Resp 18   Ht 5\' 2"  (1.575 m)   Wt 188 lb (85.3 kg)   LMP 06/14/2016   SpO2 100%   BMI 34.39 kg/m   Physical Exam  Constitutional: She is oriented to person, place, and time. She appears well-developed and well-nourished. No distress.  HENT:  Head: Normocephalic and atraumatic.  Right Ear: External ear normal.  Left Ear: External ear normal.  Nose: Nose normal.  Mouth/Throat: Oropharynx is clear and moist. No oropharyngeal exudate.  Eyes: Conjunctivae and EOM are normal. Pupils are equal, round, and reactive to light.  Neck: Normal range of motion. Neck supple.  Cardiovascular: Normal rate, regular rhythm, normal heart sounds and intact distal pulses.   No murmur heard. Pulmonary/Chest: Effort normal and breath sounds normal. No stridor. No tachypnea. No respiratory distress. She has no decreased breath sounds. She has no wheezes. She has no rhonchi. She has no rales. She exhibits no tenderness.  Abdominal: Soft. Bowel sounds are normal. She exhibits no distension. There is no tenderness. There is no rebound.  Musculoskeletal: She exhibits no  tenderness.  Neurological: She is alert and oriented to person, place, and time. She has normal reflexes. She displays normal reflexes. No cranial nerve deficit or sensory deficit. She exhibits normal muscle tone.  Skin: Skin is warm. Capillary refill takes less than 2 seconds. No rash noted. She is not diaphoretic. No erythema.  Psychiatric: She has a normal mood and affect.  Nursing note and vitals reviewed.    ED Treatments / Results  Labs (all labs ordered are listed, but only abnormal results are displayed) Labs Reviewed  BASIC METABOLIC PANEL - Abnormal; Notable for the following:       Result Value   Potassium 3.3 (*)    Chloride 100 (*)    All other components within normal limits  CBC - Abnormal; Notable for the following:    WBC 11.1 (*)    All other components within normal limits  URINALYSIS, ROUTINE W REFLEX MICROSCOPIC - Abnormal; Notable for the following:    Color, Urine  COLORLESS (*)    All other components within normal limits  D-DIMER, QUANTITATIVE (NOT AT [facility])  PROTIME-INR  HEPATIC FUNCTION PANEL  LIPASE, BLOOD  I-STAT TROPOININ, ED  I-STAT BETA HCG BLOOD, ED ([facility], [facility], [facility] ONLY)  I-STAT CG4 LACTIC ACID, ED  I-STAT TROPOININ, ED  I-STAT CG4 LACTIC ACID, ED    EKG  EKG Interpretation  Date/Time:  Wednesday June 25 2016 18:30:43 EST Ventricular Rate:  86 PR Interval:  176 QRS Duration: 94 QT Interval:  364 QTC Calculation: 435 R Axis:   19 Text Interpretation:  Normal sinus rhythm Normal ECG When compared to prior, new R,R' in leads V3,V4,V5,V6 No STEMI Confirmed by Sherry Ruffing MD, CHRISTOPHER [phone]) on 06/25/2016 7:11:33 PM       Radiology Dg Chest 2 View  Result Date: 06/25/2016 CLINICAL DATA:  Central chest pain, now radiating to the left chest. EXAM: CHEST  2 VIEW COMPARISON:  08/15/2015 FINDINGS: The cardiomediastinal contours are stable with borderline cardiomegaly and ectatic thoracic aorta. The lungs are clear. Pulmonary vasculature is  normal. No consolidation, pleural effusion, or pneumothorax. No acute osseous abnormalities are seen. IMPRESSION: No acute abnormality. Stable borderline cardiomegaly and ectatic aorta. Electronically Signed   By: Jeb Levering M.D.   On: 06/25/2016 19:11    Procedures Procedures (including critical care time)  Medications Ordered in ED Medications  aspirin chewable tablet 324 mg (324 mg Oral Given 06/25/16 1958)  gi cocktail (Maalox,Lidocaine,Donnatal) (30 mLs Oral Given 06/25/16 2220)     Initial Impression / Assessment and Plan / ED Course  I have reviewed the triage vital signs and the nursing notes.  Pertinent labs & imaging results that were available during my care of the patient were reviewed by me and considered in my medical decision making (see chart for details).     [name] is a 46 y.o. female with a past medical history significant for hypertension and GERD who presents with chest pain, shortness of breath, productive cough, and lightheadedness.   History and exam are seen above.   On exam, patient's lungs are clear. Chest is nontender. Patient's abdomen is nontender. Lower extremity sensory symmetric pulses and no edema. Patient had no focal neurologic deficits.  Patient workup for etiology of chest pain. Given the chest pain with shortness of breath as well as a pleurisy, patient will have workup to look for cardiac or pulmonary etiology. Patient had a d-dimer. Patient will also have a workup to look for pneumonia given the productive cough with her symptoms. Patient will have urinalysis given the frequency.  Anticipate reassessment following workup.  Patient's diagnostic testing results are seen above. D-dimer was negative, lactic acid normal. Troponin negative 2. Heart score calculated as a 3. Hepatic function unremarkable, lipase not elevated, and urinalysis did not show evidence of infection. BMP showed slight hypokalemia of 3.3. CBC slight leukocytosis of  11.1.   Chest x-ray shows no evidence of pneumonia or other abnormality. Patient stable cardiomegaly and ectatic aorta. EKG showed no signs of acute ischemia.  Had improvement in her blood pressure during her ED stay. Do not feel patient is having hypertensive emergency. No evidence of end organ damage. Patient will need to follow-up with her PCP for further blood pressure management.  Given patient's reassuring workup including a low risk heart score and negative troponin 2, feel patient is stable for discharge. Suspect discomfort is secondary to either a reflux pain as this patient has GERD and felt burping versus a muscular skeletal pain  which worsened with patient's movement and twisting. Patient given a GI cocktail with minimal relief. Suspect musculoskeletal pain. Patient given prescription for pain medicine and muscle relaxant to help her symptoms.  Patient encouraged to follow-up with a primary care physician in the next several days for further workup as well as strict return precautions for any new or worsening symptoms.   Patient had no other questions or concerns and patient was discharged in good condition with understanding of plan of care.    Final Clinical Impressions(s) / ED Diagnoses   Final diagnoses:  Chest pain, unspecified type    New Prescriptions New Prescriptions   CYCLOBENZAPRINE (FLEXERIL) 10 MG TABLET    Take 1 tablet (10 mg total) by mouth 2 (two) times daily as needed for muscle spasms.   HYDROCODONE-ACETAMINOPHEN (NORCO/VICODIN) 5-325 MG TABLET    Take 1 tablet by mouth every 6 (six) hours as needed.    Clinical Impression: 1. Chest pain, unspecified type     Disposition: Discharge  Condition: Good  I have discussed the results, Dx and Tx plan with the pt(& family if present). He/she/they expressed understanding and agree(s) with the plan. Discharge instructions discussed at great length. Strict return precautions discussed and pt &/or family have  verbalized understanding of the instructions. No further questions at time of discharge.    New Prescriptions   CYCLOBENZAPRINE (FLEXERIL) 10 MG TABLET    Take 1 tablet (10 mg total) by mouth 2 (two) times daily as needed for muscle spasms.   HYDROCODONE-ACETAMINOPHEN (NORCO/VICODIN) 5-325 MG TABLET    Take 1 tablet by mouth every 6 (six) hours as needed.    Follow Up: [place] [phone] Schedule an appointment as soon as possible for a visit    [facility] [address] [id number] [place] [phone]  If symptoms worsen     Courtney Paris, MD 06/25/16 2337

## 2016-06-25 NOTE — ED Notes (Signed)
Patient transported to X-ray 

## 2016-07-10 ENCOUNTER — Ambulatory Visit (INDEPENDENT_AMBULATORY_CARE_PROVIDER_SITE_OTHER): Payer: BLUE CROSS/BLUE SHIELD | Admitting: Family Medicine

## 2016-07-10 VITALS — BP 150/88 | HR 66 | Temp 98.3°F | Resp 16 | Ht 62.0 in | Wt 187.0 lb

## 2016-07-10 DIAGNOSIS — R071 Chest pain on breathing: Secondary | ICD-10-CM

## 2016-07-10 DIAGNOSIS — K219 Gastro-esophageal reflux disease without esophagitis: Secondary | ICD-10-CM | POA: Diagnosis not present

## 2016-07-10 DIAGNOSIS — R0609 Other forms of dyspnea: Secondary | ICD-10-CM | POA: Diagnosis not present

## 2016-07-10 DIAGNOSIS — E042 Nontoxic multinodular goiter: Secondary | ICD-10-CM

## 2016-07-10 DIAGNOSIS — Z1231 Encounter for screening mammogram for malignant neoplasm of breast: Secondary | ICD-10-CM

## 2016-07-10 DIAGNOSIS — Z1239 Encounter for other screening for malignant neoplasm of breast: Secondary | ICD-10-CM

## 2016-07-10 DIAGNOSIS — I10 Essential (primary) hypertension: Secondary | ICD-10-CM

## 2016-07-10 DIAGNOSIS — Z7251 High risk heterosexual behavior: Secondary | ICD-10-CM

## 2016-07-10 LAB — POCT CBC
Granulocyte percent: 69 %G (ref 37–80)
HCT, POC: 44.9 % (ref 37.7–47.9)
Hemoglobin: 15.4 g/dL (ref 12.2–16.2)
LYMPH, POC: 1.9 (ref 0.6–3.4)
MCH: 30.1 pg (ref 27–31.2)
MCHC: 34.3 g/dL (ref 31.8–35.4)
MCV: 87.8 fL (ref 80–97)
MID (CBC): 0.3 (ref 0–0.9)
MPV: 8.7 fL (ref 0–99.8)
PLATELET COUNT, POC: 214 10*3/uL (ref 142–424)
POC Granulocyte: 5 (ref 2–6.9)
POC LYMPH PERCENT: 26.5 %L (ref 10–50)
POC MID %: 4.5 %M (ref 0–12)
RBC: 5.11 M/uL (ref 4.04–5.48)
RDW, POC: 14.3 %
WBC: 7.3 10*3/uL (ref 4.6–10.2)

## 2016-07-10 LAB — POCT URINALYSIS DIP (MANUAL ENTRY)
BILIRUBIN UA: NEGATIVE
Bilirubin, UA: NEGATIVE
Glucose, UA: NEGATIVE
Leukocytes, UA: NEGATIVE
Nitrite, UA: NEGATIVE
PH UA: 6.5
PROTEIN UA: NEGATIVE
SPEC GRAV UA: 1.02
UROBILINOGEN UA: 0.2

## 2016-07-10 MED ORDER — TRIAMTERENE-HCTZ 37.5-25 MG PO CAPS: 1.0000 | ORAL_CAPSULE | Freq: Every day | ORAL | 1 refills | Status: DC

## 2016-07-10 MED ORDER — BISOPROLOL FUMARATE 10 MG PO TABS: 10.0000 mg | ORAL_TABLET | Freq: Every day | ORAL | 1 refills | Status: DC

## 2016-07-10 MED ORDER — AMLODIPINE BESYLATE 10 MG PO TABS: 10.0000 mg | ORAL_TABLET | Freq: Every day | ORAL | 1 refills | Status: DC

## 2016-07-10 NOTE — Progress Notes (Signed)
Subjective:    Patient ID: [name], female    DOB: May 22, 1970, 46 y.o.   MRN: [id number]  07/10/2016  Hospitalization Follow-up (Hypertension)   HPI This 46 y.o. female presents for ED follow-up for chest pain and hypertension.  Extensive work up in ED including troponins x 2, CXR, EKG, d-dimer.  Bp in ED 223/120.   No recurrent chest pain.  Feeling well.  Patient reports good compliance with medication, good tolerance to medication, and good symptom control.    s/p ENT consultation for hoarseness. s/p endocrinology consultation for thyromegaly with hoarseness and GERD.  s/p thyroid ultrasound that showed multinodular goiter with two dominant nodules.  s/p bx negative. s/p pulmonology consultation for dyspnea.  Losartan discontinued.  Increased GERD treatment/Protonix 40mg  and Pepcid 20mg  qhs.  Stopped Losartan/Norvasc/Tenormin.  Started Bisoprolol 5mg  and Minoxidil 10mg  daily and Maxzide 25mg  daily.  If no improvement to refractory respiratory symptoms in two weeks, can change BP medications back to original rxs.  Recommended Bisprolol as most B1 selective BB available.   Requesting STD screening; also agreeable to scheduling mammogram.  Would like to return for CPE soon.   Immunization History  Administered Date(s) Administered  . Influenza-Unspecified 02/10/2016  . Pneumococcal Polysaccharide-23 10/18/2013  . Tdap 02/24/2014   BP Readings from Last 3 Encounters:  07/10/16 (!) 150/88  06/25/16 (!) 164/102  12/21/15 (!) 148/80   Wt Readings from Last 3 Encounters:  07/10/16 187 lb (84.8 kg)  06/25/16 188 lb (85.3 kg)  12/21/15 198 lb 12.8 oz (90.2 kg)   Taking Amlodipine 5mg  daily.  Bisoprolol 5mg  daily.  Dyazide 37.5mg  daily.  Plans to get a pill box.   Review of Systems  Constitutional: Negative for chills, diaphoresis, fatigue and fever.  Eyes: Negative for visual disturbance.  Respiratory: Negative for cough and shortness of breath.   Cardiovascular: Negative for  chest pain, palpitations and leg swelling.  Gastrointestinal: Negative for abdominal pain, constipation, diarrhea, nausea and vomiting.  Endocrine: Negative for cold intolerance, heat intolerance, polydipsia, polyphagia and polyuria.  Neurological: Negative for dizziness, tremors, seizures, syncope, facial asymmetry, speech difficulty, weakness, light-headedness, numbness and headaches.    Past Medical History:  Diagnosis Date  . Blood transfusion without reported diagnosis   . GERD (gastroesophageal reflux disease)   . Hypertension   . Multinodular goiter    Past Surgical History:  Procedure Laterality Date  . ENDOMETRIAL ABLATION     No Known Allergies  Social History   Social History  . Marital status: Single    Spouse name: N/A  . Number of children: N/A  . Years of education: N/A   Occupational History  . Not on file.   Social History Main Topics  . Smoking status: Never Smoker  . Smokeless tobacco: Never Used  . Alcohol use 6.0 oz/week    5 Cans of beer, 5 Shots of liquor per week     Comment: just on weekends  . Drug use: No  . Sexual activity: Yes    Birth control/ protection: Surgical   Other Topics Concern  . Not on file   Social History Narrative   Marital status: single; not dating      Children: 4 children (25, 38, 4, 81), 1 grandchild      Lives: with 2 children      Employment:  Works at [employer]      Tobacco:  None      Alcohol: 3 drinks per week; usually weekends  Drugs: none      Exercise: none               Family History  Problem Relation Age of Onset  . Hypertension Mother   . Hypertension Father   . Diabetes Father   . Diabetes Sister   . Hypertension Sister   . Hypertension Brother   . Hypertension Son   . Cancer Paternal Aunt   . Diabetes Maternal Grandmother   . Hypertension Maternal Grandmother   . Diabetes Paternal Grandmother   . Hypertension Paternal Grandmother   . Diabetes Sister   . Asthma Daughter          Objective:    BP (!) 150/88 (BP Location: Right Arm, Patient Position: Sitting, Cuff Size: Small)   Pulse 66   Temp 98.3 F (36.8 C) (Oral)   Resp 16   Ht 5\' 2"  (1.575 m)   Wt 187 lb (84.8 kg)   LMP 06/14/2016   SpO2 100%   BMI 34.20 kg/m  Physical Exam  Constitutional: She is oriented to person, place, and time. She appears well-developed and well-nourished. No distress.  HENT:  Head: Normocephalic and atraumatic.  Right Ear: External ear normal.  Left Ear: External ear normal.  Nose: Nose normal.  Mouth/Throat: Oropharynx is clear and moist.  Eyes: Conjunctivae and EOM are normal. Pupils are equal, round, and reactive to light.  Neck: Normal range of motion. Neck supple. Carotid bruit is not present. Thyromegaly present.  Cardiovascular: Normal rate, regular rhythm, normal heart sounds and intact distal pulses.  Exam reveals no gallop and no friction rub.   No murmur heard. Pulmonary/Chest: Effort normal and breath sounds normal. She has no wheezes. She has no rales.  Abdominal: Soft. Bowel sounds are normal. She exhibits no distension and no mass. There is no tenderness. There is no rebound and no guarding.  Lymphadenopathy:    She has no cervical adenopathy.  Neurological: She is alert and oriented to person, place, and time. No cranial nerve deficit.  Skin: Skin is warm and dry. No rash noted. She is not diaphoretic. No erythema. No pallor.  Psychiatric: She has a normal mood and affect. Her behavior is normal.   Depression screen [facility] 2/9 07/10/2016 07/10/2016 08/29/2015 08/15/2015 12/05/2014  Decreased Interest 0 0 0 0 0  Down, Depressed, Hopeless 0 0 0 0 0  PHQ - 2 Score 0 0 0 0 0   Fall Risk  07/10/2016 07/10/2016 08/29/2015 12/05/2014  Falls in the past year? No No No Yes  Number falls in past yr: - - - 1  Injury with Fall? - - - No        Assessment & Plan:   1. Chest pain on breathing   2. Dyspnea on exertion   3. Essential hypertension   4. Gastroesophageal  reflux disease without esophagitis   5. Multinodular goiter (nontoxic)   6. High risk sexual behavior   7. Breast cancer screening    -ED records reviewed in detail during visit.  Increase Amlodipine to 10mg  daily; increase Bisoprolol to 10mg  daily; continue Dyazide.  Obtain labs.  No recurrent chest pain since ED visit. -per patient requests, obtain STD labs. -refer for mammogram. -s/p endocrinology consultation for multinodular goiter; no further treatment warrants; s/p [place] and bx. -s/p pulmonology consultation for DOE and severe obstruction on spirometry.  Recommended treating GERD aggressively and changing all BP medications to rule out potential side effects.   Orders Placed This Encounter  Procedures  .  GC/Chlamydia Probe Amp    Order Specific Question:   Source    Answer:   urine  . MM SCREENING BREAST TOMO BILATERAL    Standing Status:   Future    Standing Expiration Date:   09/10/2017    Order Specific Question:   Reason for Exam (SYMPTOM  OR DIAGNOSIS REQUIRED)    Answer:   annual screening    Order Specific Question:   Is the patient pregnant?    Answer:   No    Order Specific Question:   Preferred imaging location?    Answer:   [facility]  . HIV antibody  . RPR  . Comprehensive metabolic panel  . POCT urinalysis dipstick  . POCT CBC   Meds ordered this encounter  Medications  . amLODipine (NORVASC) 10 MG tablet    Sig: Take 1 tablet (10 mg total) by mouth daily.    Dispense:  90 tablet    Refill:  1  . bisoprolol (ZEBETA) 10 MG tablet    Sig: Take 1 tablet (10 mg total) by mouth daily.    Dispense:  90 tablet    Refill:  1  . triamterene-hydrochlorothiazide (DYAZIDE) 37.5-25 MG capsule    Sig: Take 1 each (1 capsule total) by mouth daily.    Dispense:  90 capsule    Refill:  1    Return in about 4 weeks (around 08/07/2016) for complete physical examiniation.   Jaquay Morneault Elayne Guerin, M.D. [facility] [address] [place], [place]  [address] [phone] phone [phone] fax

## 2016-07-10 NOTE — Patient Instructions (Addendum)
IF you received an x-ray today, you will receive an invoice from [facility]. Please contact [facility] at [phone] with questions or concerns regarding your invoice.   IF you received labwork today, you will receive an invoice from [facility]. Please contact [facility] at [phone] with questions or concerns regarding your invoice.   Our billing staff will not be able to assist you with questions regarding bills from these companies.  You will be contacted with the lab results as soon as they are available. The fastest way to get your results is to activate your My Chart account. Instructions are located on the last page of this paperwork. If you have not heard from [place] regarding the results in 2 weeks, please contact this office.         Managing Your Hypertension Hypertension is commonly called high blood pressure. This is when the force of your blood pressing against the walls of your arteries is too strong. Arteries are blood vessels that carry blood from your heart throughout your body. Hypertension forces the heart to work harder to pump blood, and may cause the arteries to become narrow or stiff. Having untreated or uncontrolled hypertension can cause heart attack, stroke, kidney disease, and other problems. What are blood pressure readings? A blood pressure reading consists of a higher number over a lower number. Ideally, your blood pressure should be below 120/80. The first ("top") number is called the systolic pressure. It is a measure of the pressure in your arteries as your heart beats. The second ("bottom") number is called the diastolic pressure. It is a measure of the pressure in your arteries as the heart relaxes. What does my blood pressure reading mean? Blood pressure is classified into four stages. Based on your blood pressure reading, your health care provider may use the following stages to determine what type of treatment you need, if any.  Systolic pressure and diastolic pressure are measured in a unit called mm Hg. Normal   Systolic pressure: below 867.  Diastolic pressure: below 80. Elevated   Systolic pressure: 619-509.  Diastolic pressure: below 80. Hypertension stage 1     Diastolic pressure: 32-67. Hypertension stage 2   Systolic pressure: 124 or above.  Diastolic pressure: 90 or above. What health risks are associated with hypertension? Managing your hypertension is an important responsibility. Uncontrolled hypertension can lead to:  A heart attack.  A stroke.  A weakened blood vessel (aneurysm).  Heart failure.  Kidney damage.  Eye damage.  Metabolic syndrome.  Memory and concentration problems. What changes can I make to manage my hypertension? Eating and drinking   Eat a diet that is high in fiber and potassium, and low in salt (sodium), added sugar, and fat. An example eating plan is called the DASH (Dietary Approaches to Stop Hypertension) diet. To eat this way:  Eat plenty of fresh fruits and vegetables. Try to fill half of your plate at each meal with fruits and vegetables.  Eat whole grains, such as whole wheat pasta, brown rice, or whole grain bread. Fill about one quarter of your plate with whole grains.  Eat low-fat diary products.  Avoid fatty cuts of meat, processed or cured meats, and poultry with skin. Fill about one quarter of your plate with lean proteins such as fish, chicken without skin, beans, eggs, and tofu.  Avoid premade and processed foods. These tend to be higher in sodium, added sugar, and fat.     Lifestyle   Work  with your health care provider to maintain a healthy body weight, or to lose weight. Ask what an ideal weight is for you.  Get at least 30 minutes of exercise that causes your heart to beat faster (aerobic exercise) most days of the week. Activities may include walking, swimming, or biking.       Monitoring   Monitor your blood pressure  at home as told by your health care provider. Your personal target blood pressure may vary depending on your medical conditions, your age, and other factors.  Have your blood pressure checked regularly, as often as told by your health care provider. Working with your health care provider   Review all the medicines you take with your health care provider because there may be side effects or interactions.  Talk with your health care provider about your diet, exercise habits, and other lifestyle factors that may be contributing to hypertension.  Visit your health care provider regularly. Your health care provider can help you create and adjust your plan for managing hypertension. Will I need medicine to control my blood pressure? Your health care provider may prescribe medicine if lifestyle changes are not enough to get your blood pressure under control, and if:  Your systolic blood pressure is 130 or higher.  Your diastolic blood pressure is 80 or higher. Take medicines only as told by your health care provider. Follow the directions carefully. Blood pressure medicines must be taken as prescribed. The medicine does not work as well when you skip doses. Skipping doses also puts you at risk for problems. Contact a health care provider if:  You think you are having a reaction to medicines you have taken.  You have repeated (recurrent) headaches.  You feel dizzy.  You have swelling in your ankles.  You have trouble with your vision. Get help right away if:  You develop a severe headache or confusion.  You have unusual weakness or numbness, or you feel faint.  You have severe pain in your chest or abdomen.  You vomit repeatedly.  You have trouble breathing. Summary  Hypertension is when the force of blood pumping through your arteries is too strong. If this condition is not controlled, it may put you at risk for serious complications.  Your personal target blood pressure may vary  depending on your medical conditions, your age, and other factors. For most people, a normal blood pressure is less than 120/80.  Hypertension is managed by lifestyle changes, medicines, or both. Lifestyle changes include weight loss, eating a healthy, low-sodium diet, exercising more, and limiting alcohol. This information is not intended to replace advice given to you by your health care provider. Make sure you discuss any questions you have with your health care provider. Document Released: 01/07/2012 Document Revised: 03/12/2016 Document Reviewed: 03/12/2016 Elsevier Interactive Patient Education  2017 [employer].

## 2016-07-11 ENCOUNTER — Telehealth: Payer: Self-pay | Admitting: Family Medicine

## 2016-07-11 LAB — COMPREHENSIVE METABOLIC PANEL
ALT: 31 IU/L (ref 0–32)
AST: 35 IU/L (ref 0–40)
Albumin/Globulin Ratio: 1.3 (ref 1.2–2.2)
Albumin: 4.5 g/dL (ref 3.5–5.5)
Alkaline Phosphatase: 96 IU/L (ref 39–117)
BUN/Creatinine Ratio: 13 (ref 9–23)
BUN: 10 mg/dL (ref 6–24)
Bilirubin Total: 0.4 mg/dL (ref 0.0–1.2)
CALCIUM: 9.6 mg/dL (ref 8.7–10.2)
CO2: 23 mmol/L (ref 18–29)
Chloride: 99 mmol/L (ref 96–106)
Creatinine, Ser: 0.76 mg/dL (ref 0.57–1.00)
GFR calc Af Amer: 109 mL/min/{1.73_m2} (ref >59)
GFR, EST NON AFRICAN AMERICAN: 94 mL/min/{1.73_m2} (ref >59)
GLUCOSE: 88 mg/dL (ref 65–99)
Globulin, Total: 3.5 g/dL (ref 1.5–4.5)
Potassium: 4.4 mmol/L (ref 3.5–5.2)
Sodium: 140 mmol/L (ref 134–144)
TOTAL PROTEIN: 8 g/dL (ref 6.0–8.5)

## 2016-07-11 LAB — GC/CHLAMYDIA PROBE AMP
Chlamydia trachomatis, NAA: NEGATIVE
Neisseria gonorrhoeae by PCR: NEGATIVE

## 2016-07-11 LAB — HIV ANTIBODY (ROUTINE TESTING W REFLEX): HIV Screen 4th Generation wRfx: NONREACTIVE

## 2016-07-11 LAB — RPR: RPR Ser Ql: NONREACTIVE

## 2016-07-11 NOTE — Telephone Encounter (Signed)
Patient calling about her FMLA paper work stating that Dr Tamala Julian said that it would be ready today she was upset and wanted Dr Tamala Julian to call her to tell her job something she said that she need it ASAP I advised pt that the person that help with the FMLA was out yesterday and that Dr Tamala Julian is out today will be back in tomorrow advised pt it could take up to 48-72 hours for a respond pt has called back within 15-20 min for another response to her paper work wants to know why Dr Tamala Julian would say it would be ready today and its not  please respond

## 2016-07-11 NOTE — Telephone Encounter (Signed)
Paperwork scanned and faxed to employer on 07/11/16

## 2016-07-15 ENCOUNTER — Telehealth: Payer: Self-pay | Admitting: Family Medicine

## 2016-07-15 NOTE — Telephone Encounter (Signed)
Pt calling about labs I advised her when Dr Tamala Julian review them someone will give her a call

## 2016-07-15 NOTE — Telephone Encounter (Signed)
PT IS LOOKING FOR LAB RESULTS   IT IS OK TO LEAVE A MESSAGE ON [phone]

## 2016-07-21 NOTE — Telephone Encounter (Signed)
Labs commented on on 07/16/16 via MyChart. Please call pt to confirm she has seen my comments in myChart.

## 2016-07-21 NOTE — Telephone Encounter (Signed)
Pt advised.

## 2016-07-31 ENCOUNTER — Ambulatory Visit: Payer: BLUE CROSS/BLUE SHIELD

## 2016-08-19 ENCOUNTER — Ambulatory Visit: Payer: BLUE CROSS/BLUE SHIELD | Admitting: Family Medicine

## 2016-08-20 ENCOUNTER — Encounter: Payer: BLUE CROSS/BLUE SHIELD | Admitting: Family Medicine

## 2016-08-24 ENCOUNTER — Encounter: Payer: Self-pay | Admitting: Family Medicine

## 2016-12-10 ENCOUNTER — Emergency Department (HOSPITAL_COMMUNITY): Payer: BLUE CROSS/BLUE SHIELD

## 2016-12-10 ENCOUNTER — Encounter (HOSPITAL_COMMUNITY): Payer: Self-pay | Admitting: *Deleted

## 2016-12-10 ENCOUNTER — Emergency Department (HOSPITAL_COMMUNITY)
Admission: EM | Admit: 2016-12-10 | Discharge: 2016-12-11 | Disposition: A | Discharge status: Home / Self Care | Payer: BLUE CROSS/BLUE SHIELD | Attending: Emergency Medicine | Admitting: Emergency Medicine

## 2016-12-10 DIAGNOSIS — R0789 Other chest pain: Secondary | ICD-10-CM | POA: Diagnosis present

## 2016-12-10 DIAGNOSIS — J45909 Unspecified asthma, uncomplicated: Secondary | ICD-10-CM | POA: Insufficient documentation

## 2016-12-10 DIAGNOSIS — I110 Hypertensive heart disease with heart failure: Secondary | ICD-10-CM | POA: Insufficient documentation

## 2016-12-10 DIAGNOSIS — R42 Dizziness and giddiness: Secondary | ICD-10-CM | POA: Insufficient documentation

## 2016-12-10 DIAGNOSIS — D509 Iron deficiency anemia, unspecified: Secondary | ICD-10-CM | POA: Diagnosis not present

## 2016-12-10 DIAGNOSIS — I509 Heart failure, unspecified: Secondary | ICD-10-CM | POA: Insufficient documentation

## 2016-12-10 DIAGNOSIS — I10 Essential (primary) hypertension: Secondary | ICD-10-CM

## 2016-12-10 LAB — BASIC METABOLIC PANEL
Anion gap: 12 (ref 5–15)
BUN: 9 mg/dL (ref 6–20)
CHLORIDE: 103 mmol/L (ref 101–111)
CO2: 24 mmol/L (ref 22–32)
Calcium: 9.1 mg/dL (ref 8.9–10.3)
Creatinine, Ser: 0.85 mg/dL (ref 0.44–1.00)
GFR calc Af Amer: >60 mL/min (ref >60)
GFR calc non Af Amer: >60 mL/min (ref >60)
Glucose, Bld: 97 mg/dL (ref 65–99)
POTASSIUM: 3.5 mmol/L (ref 3.5–5.1)
SODIUM: 139 mmol/L (ref 135–145)

## 2016-12-10 LAB — CBC
HEMATOCRIT: 28.8 % — AB (ref 36.0–46.0)
Hemoglobin: 8.7 g/dL — ABNORMAL LOW (ref 12.0–15.0)
MCH: 21.6 pg — ABNORMAL LOW (ref 26.0–34.0)
MCHC: 30.2 g/dL (ref 30.0–36.0)
MCV: 71.5 fL — AB (ref 78.0–100.0)
Platelets: 307 10*3/uL (ref 150–400)
RBC: 4.03 MIL/uL (ref 3.87–5.11)
RDW: 16.9 % — AB (ref 11.5–15.5)
WBC: 6.8 10*3/uL (ref 4.0–10.5)

## 2016-12-10 LAB — I-STAT BETA HCG BLOOD, ED (MC, WL, AP ONLY)

## 2016-12-10 LAB — I-STAT TROPONIN, ED: Troponin i, poc: 0 ng/mL (ref 0.00–0.08)

## 2016-12-10 NOTE — ED Notes (Signed)
Pt name called x 1 with no answer

## 2016-12-10 NOTE — ED Triage Notes (Signed)
Pt reports having mid chest discomfort x 1 week.  Reports headache, denies sob. No acute distress is noted at triage.

## 2016-12-10 NOTE — ED Notes (Addendum)
PT at [facility] asking when she will be going back because she was tole she had a room at 2100. I explained to pt name was called multiple times with no answer. Pt reports "i heard someone say Childers but they did not say Zaydee". Pt aware her name will be added back to the list.

## 2016-12-10 NOTE — ED Notes (Signed)
Pt called for room x 2 with no response

## 2016-12-11 LAB — SAMPLE TO BLOOD BANK

## 2016-12-11 LAB — POC OCCULT BLOOD, ED: FECAL OCCULT BLD: NEGATIVE

## 2016-12-11 NOTE — ED Provider Notes (Signed)
[facility] DEPT Provider Note   CSN: [id number] Arrival date & time: 12/10/16  1433     History   Chief Complaint Chief Complaint  Patient presents with  . Chest Pain  . Dizziness    HPI [name] is a 46 y.o. female.  46 year old female with a history of hypertension and GERD presents to the emergency department for central chest discomfort which has been intermittent over the past 2-3 weeks. She describes a sharp pain lasting a few seconds before spontaneously resolving. She has not had any associated headache, shortness of breath, nausea, vomiting. She does report some persistent dizziness characterized as unsteadiness, especially when changing from a seated to upright position. She denies taking any medications for her symptoms. She has noticed an increased desire to chew on ice. She has also had progressively heavier menses over the past 4 months. She states that she saw her doctor a few months ago for further evaluation, but then collected to subsequently follow-up for ultrasound. She does have a known history of fibroids. Patient with no hx of tobacco use.      Past Medical History:  Diagnosis Date  . Blood transfusion without reported diagnosis   . GERD (gastroesophageal reflux disease)   . Hypertension   . Multinodular goiter     Patient Active Problem List   Diagnosis Date Noted  . Multinodular goiter (nontoxic) 12/22/2015  . Asthma 11/19/2015  . Essential hypertension 08/29/2015  . Gastroesophageal reflux disease without esophagitis 08/29/2015  . Allergic rhinitis due to pollen 08/29/2015  . Hepatitis B immune 02/24/2014  . CHF exacerbation ([place]) 10/18/2013  . Dyspnea 10/18/2013  . Chest pain 10/18/2013  . Cardiomegaly 08/23/2012  . Fibroid, uterine 03/17/2012  . HYPERTENSION 02/02/2007    Past Surgical History:  Procedure Laterality Date  . ENDOMETRIAL ABLATION      OB History    No data available       Home Medications    Prior to  Admission medications   Medication Sig Start Date End Date Taking? Authorizing Provider  amLODipine (NORVASC) 10 MG tablet Take 1 tablet (10 mg total) by mouth daily. 07/10/16   Wardell Honour, MD  bisoprolol (ZEBETA) 10 MG tablet Take 1 tablet (10 mg total) by mouth daily. 07/10/16   Wardell Honour, MD  triamterene-hydrochlorothiazide (DYAZIDE) 37.5-25 MG capsule Take 1 each (1 capsule total) by mouth daily. 07/10/16   Wardell Honour, MD    Family History Family History  Problem Relation Age of Onset  . Hypertension Mother   . Hypertension Father   . Diabetes Father   . Diabetes Sister   . Hypertension Sister   . Hypertension Brother   . Hypertension Son   . Cancer Paternal Aunt   . Diabetes Maternal Grandmother   . Hypertension Maternal Grandmother   . Diabetes Paternal Grandmother   . Hypertension Paternal Grandmother   . Diabetes Sister   . Asthma Daughter     Social History Social History  Substance Use Topics  . Smoking status: Never Smoker  . Smokeless tobacco: Never Used  . Alcohol use 6.0 oz/week    5 Cans of beer, 5 Shots of liquor per week     Comment: just on weekends     Allergies   Patient has no known allergies.   Review of Systems Review of Systems Ten systems reviewed and are negative for acute change, except as noted in the HPI.    Physical Exam Updated Vital Signs BP Marland Kitchen)  155/98   Pulse 65   Temp 98.5 F (36.9 C) (Oral)   Resp 18   Ht 5\' 2"  (1.575 m)   Wt 84.4 kg (186 lb)   LMP 12/03/2016   SpO2 99%   BMI 34.02 kg/m   Physical Exam  Constitutional: She is oriented to person, place, and time. She appears well-developed and well-nourished. No distress.  Nontoxic and in NAD  HENT:  Head: Normocephalic and atraumatic.  Eyes: Conjunctivae and EOM are normal. No scleral icterus.  Neck: Normal range of motion.  Cardiovascular: Normal rate, regular rhythm and intact distal pulses.   Pulmonary/Chest: Effort normal. No respiratory distress.  She has no wheezes.  Respirations even and unlabored  Genitourinary:  Genitourinary Comments: Brown stool on DRE. Normal rectal tone. Exam chaperoned by Clarise Cruz, RN.  Musculoskeletal: Normal range of motion.  Neurological: She is alert and oriented to person, place, and time.  Skin: Skin is warm and dry. No rash noted. She is not diaphoretic. No erythema. No pallor.  Psychiatric: She has a normal mood and affect. Her behavior is normal.  Nursing note and vitals reviewed.    ED Treatments / Results  Labs (all labs ordered are listed, but only abnormal results are displayed) Labs Reviewed  CBC - Abnormal; Notable for the following:       Result Value   Hemoglobin 8.7 (*)    HCT 28.8 (*)    MCV 71.5 (*)    MCH 21.6 (*)    RDW 16.9 (*)    All other components within normal limits  BASIC METABOLIC PANEL  I-STAT TROPONIN, ED  I-STAT BETA HCG BLOOD, ED ([facility], [facility], [facility] ONLY)  POC OCCULT BLOOD, ED  SAMPLE TO BLOOD BANK    EKG  EKG Interpretation  Date/Time:  Wednesday December 10 2016 14:43:18 EDT Ventricular Rate:  81 PR Interval:  154 QRS Duration: 94 QT Interval:  380 QTC Calculation: 441 R Axis:   -2 Text Interpretation:  Normal sinus rhythm Left ventricular hypertrophy T wave abnormality, consider inferior ischemia Abnormal ECG When compared with ECG of 06/25/2013, No significant change was found Confirmed by Delora Fuel ([id number]) on 12/10/2016 11:38:57 PM       Radiology Dg Chest 2 View  Result Date: 12/10/2016 CLINICAL DATA:  Chest pain. EXAM: CHEST  2 VIEW COMPARISON:  06/25/2016. FINDINGS: Mediastinum hilar structures normal. Minimal right base subsegmental atelectasis and or infiltrate. Heart size normal. No pleural effusion or pneumothorax. Degenerative changes thoracic spine . IMPRESSION: Minimal right base subsegmental atelectasis and or infiltrate. Electronically Signed   By: Marcello Moores  Register   On: 12/10/2016 15:44    Procedures Procedures (including critical care  time)  Medications Ordered in ED Medications - No data to display   Initial Impression / Assessment and Plan / ED Course  I have reviewed the triage vital signs and the nursing notes.  Pertinent labs & imaging results that were available during my care of the patient were reviewed by me and considered in my medical decision making (see chart for details).     46 year old female presents to the emergency department for episodes of dizziness without syncope or near-syncope. She has also had sharp chest pain lasting a few seconds before spontaneously resolving. This has been sporadic over the past 2-3 weeks. She reports increased consumption of ice chips. No fevers.  PICA consistent with new-onset anemia. Hemoglobin is 8.7 today down from baseline of 13.5. MCV is low consistent with iron deficiency anemia. Upon further probing  of history, patient notes 4 months of increased heaviness of menses. She previously saw her OB/GYN, but neglected to follow-up to months ago for ultrasound. She is Hemoccult negative and without signs of acute/emergent blood loss. No tachycardia, hypotension. Orthostatics stable.  Chest pain and dizziness also suspected to be secondary to anemia. Do not see any indication for emergent transfusion, but I have urged the patient to start taking an iron supplement. She has been advised to see her OB/GYN for additional follow-up and to have Hgb rechecked in 1 week. Patient reporting history of fibroids which may be contributing to her menorrhagia. Return precautions discussed and provided. Patient discharged in stable condition with no unaddressed concerns.   Final Clinical Impressions(s) / ED Diagnoses   Final diagnoses:  Iron deficiency anemia, unspecified iron deficiency anemia type  Atypical chest pain  Hypertension not at goal    New Prescriptions Discharge Medication List as of 12/11/2016  2:38 AM       Antonietta Breach, PA-C 12/11/16 0350    Ripley Fraise,  MD 12/12/16 [phone]

## 2017-01-23 ENCOUNTER — Emergency Department (HOSPITAL_COMMUNITY)
Admission: EM | Admit: 2017-01-23 | Discharge: 2017-01-23 | Disposition: A | Discharge status: Home / Self Care | Payer: BLUE CROSS/BLUE SHIELD | Attending: Emergency Medicine | Admitting: Emergency Medicine

## 2017-01-23 ENCOUNTER — Encounter (HOSPITAL_COMMUNITY): Payer: Self-pay

## 2017-01-23 ENCOUNTER — Emergency Department (HOSPITAL_COMMUNITY): Payer: BLUE CROSS/BLUE SHIELD

## 2017-01-23 DIAGNOSIS — R42 Dizziness and giddiness: Secondary | ICD-10-CM | POA: Insufficient documentation

## 2017-01-23 DIAGNOSIS — R079 Chest pain, unspecified: Secondary | ICD-10-CM | POA: Diagnosis present

## 2017-01-23 DIAGNOSIS — I10 Essential (primary) hypertension: Secondary | ICD-10-CM | POA: Diagnosis not present

## 2017-01-23 DIAGNOSIS — Z79899 Other long term (current) drug therapy: Secondary | ICD-10-CM | POA: Insufficient documentation

## 2017-01-23 DIAGNOSIS — J45909 Unspecified asthma, uncomplicated: Secondary | ICD-10-CM | POA: Diagnosis not present

## 2017-01-23 LAB — CBC
HCT: 29.6 % — ABNORMAL LOW (ref 36.0–46.0)
HEMOGLOBIN: 8.5 g/dL — AB (ref 12.0–15.0)
MCH: 20 pg — AB (ref 26.0–34.0)
MCHC: 28.7 g/dL — ABNORMAL LOW (ref 30.0–36.0)
MCV: 69.6 fL — ABNORMAL LOW (ref 78.0–100.0)
Platelets: 202 10*3/uL (ref 150–400)
RBC: 4.25 MIL/uL (ref 3.87–5.11)
RDW: 18.2 % — ABNORMAL HIGH (ref 11.5–15.5)
WBC: 9.4 10*3/uL (ref 4.0–10.5)

## 2017-01-23 LAB — BASIC METABOLIC PANEL
Anion gap: 9 (ref 5–15)
BUN: 10 mg/dL (ref 6–20)
CALCIUM: 9.1 mg/dL (ref 8.9–10.3)
CO2: 25 mmol/L (ref 22–32)
Chloride: 102 mmol/L (ref 101–111)
Creatinine, Ser: 0.81 mg/dL (ref 0.44–1.00)
GFR calc Af Amer: >60 mL/min (ref >60)
GLUCOSE: 100 mg/dL — AB (ref 65–99)
Potassium: 3.5 mmol/L (ref 3.5–5.1)
Sodium: 136 mmol/L (ref 135–145)

## 2017-01-23 LAB — I-STAT TROPONIN, ED
TROPONIN I, POC: 0 ng/mL (ref 0.00–0.08)
Troponin i, poc: 0 ng/mL (ref 0.00–0.08)

## 2017-01-23 LAB — POC URINE PREG, ED: PREG TEST UR: NEGATIVE

## 2017-01-23 NOTE — ED Notes (Signed)
Patient transported to X-ray 

## 2017-01-23 NOTE — Discharge Instructions (Signed)
Your workup today is reassuring, your labs, chest x-ray, EKGs show no evidence of an acute cardiac or pulmonary process. Please use the phone number provided to establish care with a primary care provider as discussed, please follow-up with Dr. Johnsie Cancel cardiology as well. Return to the emergency department for reevaluation if your chest pain returns, you have shortness of breath, dizziness or other new or concerning symptoms.

## 2017-01-23 NOTE — ED Provider Notes (Signed)
Medical screening examination/treatment/procedure(s) were conducted as a shared visit with non-physician practitioner(s) and myself.  I personally evaluated the patient during the encounter.   EKG Interpretation  Date/Time:  Friday January 23 2017 13:32:49 EDT Ventricular Rate:  66 PR Interval:  188 QRS Duration: 96 QT Interval:  436 QTC Calculation: 457 R Axis:   49 Text Interpretation:  Normal sinus rhythm Normal ECG Confirmed by Brantley Stage [phone]) on 01/23/2017 1:50:74 PM      46 year old female who presents with near syncopal episode and chest pain. History of fibroids, anemia, HTN, obesity. Reports get up to walk down the hall from work this morning, when she felt warm, dizzy, nauseated with blurry vision. Felt like she could los consciousness. Had sharp intermittent pains in the left breast/chest that last for 5 minutes. Sat down, and feeling subsequently past. No chest pain currently. No syncope, speech changes, focal neurological deficits.   Well appearing in ED. VS not concerning. Exam non-focal. EKG is not acutely ischemic. Troponin normal. PERC negative. CXR visualized, and shows no cardiopulmonary processes. Heart score 3, with somewhat atypical story for ACS. Does have anemia of 8.2, with hgb of 8.5 one month ago. Chronic fatigue lasting years, but no other recent symptoms of DOE, exertional chest pain, extreme fatigue or concerns that she's symptomatic from anemia. This is related to fibroids, which she is arranging f/u with gynecology for follow-up. Currently no vaginal bleeding, just finished menses 1 week ago.  Serial troponin normal. EKG without acute ischemic changes. No concerns for PE or dissection or other serious cardiopulmonary or intrathoracic etiologies. Felt stable for outpatient management and work-up. She does have cardiologist who she can see. Strict return and follow-up instructions reviewed. She expressed understanding of all discharge instructions and felt  comfortable with the plan of care.    Forde Dandy, MD 01/23/17 [phone]

## 2017-01-23 NOTE — ED Triage Notes (Signed)
Pt developed Left sided CP while at work today.  Pain lasted 5 min and resolved on its own.  Pt  Reported nausea, belching, dizziness and vision changes during episode.  Pt denies cp, sob and dizziness upon arrival.

## 2017-01-23 NOTE — ED Provider Notes (Signed)
[facility] DEPT Provider Note   CSN: [id number] Arrival date & time: 01/23/17  9983     History   Chief Complaint Chief Complaint  Patient presents with  . Chest Pain    HPI  [name] is a 46 y.o. Female with a history of poorly controlled hypertension, anemia secondary to bleeding from uterine fibroids, GERD, who presents today after an episode of left-sided chest pain at work. Patient reports at work this morning she started to feel dizzy and unsteady when she was walking to the bathroom, she also felt like her vision was blurry and couldn't focus, syncope. She reports some nausea and belching with this dizziness, but did not vomit. She then developed sharp left-sided chest pains under her left breast that wrap around the anterior chest wall, chest pain was constant for 5 minutes, and then resolved on its own, this pain did not radiate anywhere. Patient reports some shortness of breath with this chest pain. She has not yet taken her blood pressure medications this morning. Denies exogenous estrogen use, lower extremity pain or swelling, recent travel or immobilization, history of PE or DVT, family or personal history of bleeding or clotting disorders, cough or hemoptysis. She denies any numbness or weakness of extremities, she reports occasional tingling of her left hand, but this symptom is not new. Patient denies any fevers or chills. Denies abdominal pain, no dysuria. Patient has had chest pains in the past when her blood pressure is been out of control, or her hemoglobin has been low, but she feels that these symptoms are different from previous. Patient is not diabetic, no family history of MI, only family history of diabetes and strokes. Patient has seen cardiology intermittently, she had an echo in May 2014 which showed normal EF of 38-25%, no diastolic dysfunction. She has not had a heart catheterization or stress test.      Past Medical History:  Diagnosis Date  . Blood  transfusion without reported diagnosis   . GERD (gastroesophageal reflux disease)   . Hypertension   . Multinodular goiter     Patient Active Problem List   Diagnosis Date Noted  . Multinodular goiter (nontoxic) 12/22/2015  . Asthma 11/19/2015  . Essential hypertension 08/29/2015  . Gastroesophageal reflux disease without esophagitis 08/29/2015  . Allergic rhinitis due to pollen 08/29/2015  . Hepatitis B immune 02/24/2014  . CHF exacerbation ([place]) 10/18/2013  . Dyspnea 10/18/2013  . Chest pain 10/18/2013  . Cardiomegaly 08/23/2012  . Fibroid, uterine 03/17/2012  . HYPERTENSION 02/02/2007    Past Surgical History:  Procedure Laterality Date  . ENDOMETRIAL ABLATION      OB History    No data available       Home Medications    Prior to Admission medications   Medication Sig Start Date End Date Taking? Authorizing Provider  amLODipine (NORVASC) 10 MG tablet Take 1 tablet (10 mg total) by mouth daily. 07/10/16   Wardell Honour, MD  bisoprolol (ZEBETA) 10 MG tablet Take 1 tablet (10 mg total) by mouth daily. 07/10/16   Wardell Honour, MD  triamterene-hydrochlorothiazide (DYAZIDE) 37.5-25 MG capsule Take 1 each (1 capsule total) by mouth daily. 07/10/16   Wardell Honour, MD    Family History Family History  Problem Relation Age of Onset  . Hypertension Mother   . Hypertension Father   . Diabetes Father   . Diabetes Sister   . Hypertension Sister   . Hypertension Brother   . Hypertension Son   .  Cancer Paternal Aunt   . Diabetes Maternal Grandmother   . Hypertension Maternal Grandmother   . Diabetes Paternal Grandmother   . Hypertension Paternal Grandmother   . Diabetes Sister   . Asthma Daughter     Social History Social History  Substance Use Topics  . Smoking status: Never Smoker  . Smokeless tobacco: Never Used  . Alcohol use 6.0 oz/week    5 Cans of beer, 5 Shots of liquor per week     Comment: just on weekends     Allergies   Patient has no  known allergies.   Review of Systems Review of Systems  Constitutional: Positive for fatigue. Negative for chills and fever.  HENT: Negative for congestion, rhinorrhea and sore throat.   Eyes: Negative for photophobia and visual disturbance.  Respiratory: Positive for shortness of breath. Negative for cough and chest tightness.   Cardiovascular: Positive for chest pain. Negative for palpitations and leg swelling.  Gastrointestinal: Positive for nausea. Negative for abdominal pain and vomiting.  Genitourinary: Negative for dysuria.  Musculoskeletal: Negative for myalgias.  Skin: Negative for pallor and rash.  Neurological: Positive for dizziness. Negative for weakness, numbness and headaches.     Physical Exam Updated Vital Signs BP (!) 159/99 (BP Location: Left Arm)   Pulse 68   Temp 98.4 F (36.9 C) (Oral)   Resp 18   Ht 5\' 2"  (1.575 m)   Wt 84.8 kg (187 lb)   SpO2 100%   BMI 34.20 kg/m   Physical Exam  Constitutional: She appears well-developed and well-nourished. No distress.  HENT:  Head: Normocephalic and atraumatic.  Eyes: Pupils are equal, round, and reactive to light. EOM are normal. Right eye exhibits no discharge. Left eye exhibits no discharge.  Cardiovascular: Normal rate, regular rhythm, normal heart sounds and intact distal pulses.   Pulmonary/Chest: Effort normal and breath sounds normal. No respiratory distress. She has no wheezes. She has no rales. She exhibits no tenderness.  Abdominal: Soft. Bowel sounds are normal. She exhibits no distension and no mass. There is no tenderness. There is no guarding.  Musculoskeletal: She exhibits no edema or deformity.  No unilateral calf swelling, LE NTTP  Neurological: She is alert. Coordination normal.  Speech is clear, able to follow commands CN III-XII intact Normal strength in upper and lower extremities bilaterally including dorsiflexion and plantar flexion, strong and equal grip strength Sensation normal to  light and sharp touch Moves extremities without ataxia, coordination intact   Skin: Skin is warm and dry. Capillary refill takes less than 2 seconds. She is not diaphoretic.  Psychiatric: She has a normal mood and affect. Her behavior is normal.  Nursing note and vitals reviewed.    ED Treatments / Results  Labs (all labs ordered are listed, but only abnormal results are displayed) Labs Reviewed  BASIC METABOLIC PANEL - Abnormal; Notable for the following:       Result Value   Glucose, Bld 100 (*)    All other components within normal limits  CBC - Abnormal; Notable for the following:    Hemoglobin 8.5 (*)    HCT 29.6 (*)    MCV 69.6 (*)    MCH 20.0 (*)    MCHC 28.7 (*)    RDW 18.2 (*)    All other components within normal limits  I-STAT TROPONIN, ED  I-STAT TROPONIN, ED  POC URINE PREG, ED    EKG  EKG Interpretation  Date/Time:  Friday January 23 2017 09:23:09 EDT Ventricular  Rate:  68 PR Interval:  198 QRS Duration: 90 QT Interval:  424 QTC Calculation: 450 R Axis:   14 Text Interpretation:  Normal sinus rhythm Nonspecific T wave abnormality Abnormal ECG T-wave abnormalities in inferior and lateral leads, similar to previous EKG 12/10/2016 Confirmed by Brantley Stage [phone]) on 01/23/2017 10:04:37 AM       Radiology Dg Chest 2 View  Result Date: 01/23/2017 CLINICAL DATA:  Left-sided chest pain, nausea, and lightheadedness. EXAM: CHEST  2 VIEW COMPARISON:  12/10/2016 FINDINGS: The cardiomediastinal silhouette is unchanged and within normal limits. Minimal right basilar opacity likely represents atelectasis. The lungs are otherwise clear. No pleural effusion or pneumothorax is identified. No acute osseous abnormality is seen. IMPRESSION: Minimal right basilar atelectasis. Electronically Signed   By: Logan Bores M.D.   On: 01/23/2017 10:17    Procedures Procedures (including critical care time)  Medications Ordered in ED Medications - No data to display   Initial  Impression / Assessment and Plan / ED Course  I have reviewed the triage vital signs and the nursing notes.  Pertinent labs & imaging results that were available during my care of the patient were reviewed by me and considered in my medical decision making (see chart for details).  Patient well appearing in the ED, vitals not concerning. Significant findings on exam. Troponin negative, EKG unconcerning for acute ischemia, CXR shows no active cardiopulmomary disease. Heart Pathway Score 3, PERC neg, Heart pathway score of 3,story is a bit atypical for ACS, no concern for dissection. Hemoglobin 8.5 2/2 bleeding from uterine fibroids but this is stable from previous, she is not having dyspnea on or chest pain on exertion, or extreme fatigue to suggest pt is symptomatic with anemia. Labs unremarkable, PERC negative. No concern for PE or dissection.  Repeat troponin and EKG are both negative for ischemia, pt remains symptoms free. Pt stable for discharge with follow up with PCP and cardiology, strict return precautions provided. Pt expresses understanding and agrees with plan.   Patient discussed with Dr. Brantley Stage, who saw patient as well and agrees with plan.   Final Clinical Impressions(s) / ED Diagnoses   Final diagnoses:  Chest pain, unspecified type  Dizziness    New Prescriptions Discharge Medication List as of 01/23/2017  1:54 PM       Jacqlyn Larsen, PA-C 01/23/17 1759    Forde Dandy, MD 01/29/17 1407

## 2017-08-27 ENCOUNTER — Other Ambulatory Visit: Payer: Self-pay | Admitting: Family Medicine

## 2017-08-28 NOTE — Telephone Encounter (Signed)
Patient called, unable to leave VM due to mailbox is full. Patient will need an office visit before refills.  Last OV:07/10/16 Last Refill:07/10/16 90 tab/1 refill for all three meds (prescription expired) DDU:[id number] Pharmacy: [id number] - [place], [place] [phone] (Phone) [phone] (Fax)

## 2017-09-17 ENCOUNTER — Encounter: Payer: Self-pay | Admitting: Family Medicine

## 2017-09-22 ENCOUNTER — Other Ambulatory Visit: Payer: Self-pay | Admitting: Family Medicine

## 2017-09-23 NOTE — Telephone Encounter (Signed)
Patient called, left VM to return call to the office to schedule yearly physical with Dr. Tamala Julian, unable to refill medication due to last OV 07/10/16 and patient is needing an appointment.

## 2017-09-25 ENCOUNTER — Other Ambulatory Visit: Payer: Self-pay

## 2017-09-25 ENCOUNTER — Encounter (HOSPITAL_COMMUNITY): Payer: Self-pay | Admitting: Emergency Medicine

## 2017-09-25 ENCOUNTER — Emergency Department (HOSPITAL_COMMUNITY)
Admission: EM | Admit: 2017-09-25 | Discharge: 2017-09-26 | Disposition: A | Discharge status: Home / Self Care | Payer: Self-pay | Attending: Emergency Medicine | Admitting: Emergency Medicine

## 2017-09-25 ENCOUNTER — Emergency Department (HOSPITAL_COMMUNITY): Payer: Self-pay

## 2017-09-25 DIAGNOSIS — R404 Transient alteration of awareness: Secondary | ICD-10-CM | POA: Insufficient documentation

## 2017-09-25 DIAGNOSIS — R519 Headache, unspecified: Secondary | ICD-10-CM

## 2017-09-25 DIAGNOSIS — R51 Headache: Secondary | ICD-10-CM | POA: Insufficient documentation

## 2017-09-25 DIAGNOSIS — R200 Anesthesia of skin: Secondary | ICD-10-CM | POA: Insufficient documentation

## 2017-09-25 DIAGNOSIS — D509 Iron deficiency anemia, unspecified: Secondary | ICD-10-CM | POA: Insufficient documentation

## 2017-09-25 DIAGNOSIS — J45909 Unspecified asthma, uncomplicated: Secondary | ICD-10-CM | POA: Insufficient documentation

## 2017-09-25 DIAGNOSIS — I509 Heart failure, unspecified: Secondary | ICD-10-CM | POA: Insufficient documentation

## 2017-09-25 DIAGNOSIS — Z79899 Other long term (current) drug therapy: Secondary | ICD-10-CM | POA: Insufficient documentation

## 2017-09-25 DIAGNOSIS — I110 Hypertensive heart disease with heart failure: Secondary | ICD-10-CM | POA: Insufficient documentation

## 2017-09-25 LAB — CBC
HCT: 29.6 % — ABNORMAL LOW (ref 36.0–46.0)
Hemoglobin: 8.4 g/dL — ABNORMAL LOW (ref 12.0–15.0)
MCH: 20 pg — ABNORMAL LOW (ref 26.0–34.0)
MCHC: 28.4 g/dL — ABNORMAL LOW (ref 30.0–36.0)
MCV: 70.3 fL — ABNORMAL LOW (ref 78.0–100.0)
PLATELETS: 239 10*3/uL (ref 150–400)
RBC: 4.21 MIL/uL (ref 3.87–5.11)
RDW: 19.1 % — AB (ref 11.5–15.5)
WBC: 10.1 10*3/uL (ref 4.0–10.5)

## 2017-09-25 LAB — I-STAT TROPONIN, ED
Troponin i, poc: 0 ng/mL (ref 0.00–0.08)
Troponin i, poc: 0.01 ng/mL (ref 0.00–0.08)

## 2017-09-25 LAB — DIFFERENTIAL
Abs Immature Granulocytes: 0 10*3/uL (ref 0.0–0.1)
BASOS ABS: 0.1 10*3/uL (ref 0.0–0.1)
Basophils Relative: 1 %
EOS PCT: 2 %
Eosinophils Absolute: 0.2 10*3/uL (ref 0.0–0.7)
Immature Granulocytes: 0 %
Lymphocytes Relative: 20 %
Lymphs Abs: 2 10*3/uL (ref 0.7–4.0)
MONO ABS: 1 10*3/uL (ref 0.1–1.0)
Monocytes Relative: 9 %
NEUTROS PCT: 68 %
Neutro Abs: 6.9 10*3/uL (ref 1.7–7.7)

## 2017-09-25 LAB — COMPREHENSIVE METABOLIC PANEL
ALK PHOS: 57 U/L (ref 38–126)
ALT: 28 U/L (ref 14–54)
ANION GAP: 11 (ref 5–15)
AST: 40 U/L (ref 15–41)
Albumin: 4 g/dL (ref 3.5–5.0)
BUN: 9 mg/dL (ref 6–20)
CALCIUM: 9.6 mg/dL (ref 8.9–10.3)
CO2: 25 mmol/L (ref 22–32)
Chloride: 102 mmol/L (ref 101–111)
Creatinine, Ser: 0.88 mg/dL (ref 0.44–1.00)
GFR calc non Af Amer: >60 mL/min (ref >60)
Glucose, Bld: 82 mg/dL (ref 65–99)
Potassium: 3.4 mmol/L — ABNORMAL LOW (ref 3.5–5.1)
SODIUM: 138 mmol/L (ref 135–145)
TOTAL PROTEIN: 7.6 g/dL (ref 6.5–8.1)
Total Bilirubin: 1.1 mg/dL (ref 0.3–1.2)

## 2017-09-25 LAB — PROTIME-INR
INR: 1.07
PROTHROMBIN TIME: 13.8 s (ref 11.4–15.2)

## 2017-09-25 LAB — I-STAT BETA HCG BLOOD, ED (MC, WL, AP ONLY): I-stat hCG, quantitative: <5 m[IU]/mL (ref <5)

## 2017-09-25 LAB — APTT: APTT: 31 s (ref 24–36)

## 2017-09-25 NOTE — ED Provider Notes (Signed)
[facility] EMERGENCY DEPARTMENT Provider Note   CSN: [id number] Arrival date & time: 09/25/17  1526     History   Chief Complaint Chief Complaint  Patient presents with  . Chest Pain    HPI [name] is a 47 y.o. female.  HPI  Patient is a 47 year old with a history of hypertension, anemia, multinodular goiter, and fibroids of the uterus presenting for visual changes, speech disturbance, and left-sided arm "tingling" that occurred earlier today.  Patient reports that around 12:10 PM today, she experienced a sensation of "feeling off" and feeling dizzy with blurred vision more than her baseline that she experiences when her blood pressure is high.  Patient reports that she went to pick up lunch, and when she returned, her coworkers said that she was not verbalizing normally, and slow to respond.  Patient reports she also had a sense of not feeling like she could answer her coworkers appropriately.  Patient reports that she decided to leave work and called her sister on the way, and her sister also said her speech did not sound normal to her.  Patient reports that during her drive she experienced a "tingling" in her left arm particularly circumferentially around the forearm and not extending into the left hand.  Patient reports she also felt that the left forearm felt "tight".Patient denies  any facial droop noted by her coworkers, sensation of weakness or numbness in lower extremities, or dysphasia.  Patient reports she had transient chest pain today, but this has occurred previously and in the absence of the symptoms.  Patient reports she has shortness of breath last week, and attributed this to having heavy menses.  Patient reports she has a baseline of heavy menses that can last up to 2 weeks, and she has known uterine fibroids.  Past Medical History:  Diagnosis Date  . Blood transfusion without reported diagnosis   . GERD (gastroesophageal reflux disease)   .  Hypertension   . Multinodular goiter     Patient Active Problem List   Diagnosis Date Noted  . Multinodular goiter (nontoxic) 12/22/2015  . Asthma 11/19/2015  . Essential hypertension 08/29/2015  . Gastroesophageal reflux disease without esophagitis 08/29/2015  . Allergic rhinitis due to pollen 08/29/2015  . Hepatitis B immune 02/24/2014  . CHF exacerbation ([place]) 10/18/2013  . Dyspnea 10/18/2013  . Chest pain 10/18/2013  . Cardiomegaly 08/23/2012  . Fibroid, uterine 03/17/2012  . HYPERTENSION 02/02/2007    Past Surgical History:  Procedure Laterality Date  . ENDOMETRIAL ABLATION       OB History   None      Home Medications    Prior to Admission medications   Medication Sig Start Date End Date Taking? Authorizing Provider  amLODipine (NORVASC) 10 MG tablet Take 1 tablet (10 mg total) by mouth daily. 07/10/16   Wardell Honour, MD  bisoprolol (ZEBETA) 10 MG tablet Take 1 tablet (10 mg total) by mouth daily. 07/10/16   Wardell Honour, MD  triamterene-hydrochlorothiazide (DYAZIDE) 37.5-25 MG capsule Take 1 each (1 capsule total) by mouth daily. 07/10/16   Wardell Honour, MD    Family History Family History  Problem Relation Age of Onset  . Hypertension Mother   . Hypertension Father   . Diabetes Father   . Diabetes Sister   . Hypertension Sister   . Hypertension Brother   . Hypertension Son   . Cancer Paternal Aunt   . Diabetes Maternal Grandmother   . Hypertension Maternal  Grandmother   . Diabetes Paternal Grandmother   . Hypertension Paternal Grandmother   . Diabetes Sister   . Asthma Daughter     Social History Social History   Tobacco Use  . Smoking status: Never Smoker  . Smokeless tobacco: Never Used  Substance Use Topics  . Alcohol use: Yes    Alcohol/week: 6.0 oz    Types: 5 Cans of beer, 5 Shots of liquor per week    Comment: just on weekends  . Drug use: No     Allergies   Patient has no known allergies.   Review of Systems Review  of Systems  Constitutional: Negative for chills and fever.  HENT: Negative for congestion and rhinorrhea.   Eyes: Negative for visual disturbance.  Respiratory: Negative for chest tightness and shortness of breath.   Cardiovascular: Negative for chest pain.  Gastrointestinal: Negative for abdominal distention, nausea and vomiting.  Genitourinary: Negative for dysuria.  Musculoskeletal: Negative for arthralgias and joint swelling.  Skin: Negative for rash.  Neurological: Negative for dizziness, seizures, syncope, speech difficulty, weakness and light-headedness.  All other systems reviewed and are negative.    Physical Exam Updated Vital Signs BP (!) 185/106   Pulse 77   Temp 98.2 F (36.8 C) (Oral)   Resp 16   SpO2 100%   Physical Exam  Constitutional: She appears well-developed and well-nourished. No distress.  HENT:  Head: Normocephalic and atraumatic.  Mouth/Throat: Oropharynx is clear and moist.  Eyes: Pupils are equal, round, and reactive to light. Conjunctivae and EOM are normal.  Neck: Normal range of motion. Neck supple.  Cardiovascular: Normal rate, regular rhythm, S1 normal and S2 normal.  No murmur heard. Pulses:      Radial pulses are 2+ on the right side, and 2+ on the left side.  Pulmonary/Chest: Effort normal and breath sounds normal. She has no wheezes. She has no rales.  Abdominal: Soft. She exhibits no distension. There is no tenderness. There is no guarding.  Musculoskeletal: Normal range of motion. She exhibits no edema or deformity.  Lymphadenopathy:    She has no cervical adenopathy.  Neurological: She is alert.  Mental Status:  Alert, oriented, thought content appropriate, able to give a coherent history. Speech fluent without evidence of aphasia. Able to follow 2 step commands without difficulty.  Cranial Nerves:  II:  Peripheral visual fields grossly normal, pupils equal, round, reactive to light III,IV, VI: ptosis not present, extra-ocular  motions intact bilaterally  V,VII: smile symmetric, facial light touch sensation equal VIII: hearing grossly normal to voice  X: uvula elevates symmetrically  XI: bilateral shoulder shrug symmetric and strong XII: midline tongue extension without fassiculations Motor:  Normal tone. 5/5 in upper and lower extremities bilaterally including strong and equal grip strength and dorsiflexion/plantar flexion Sensory: Pinprick and light touch normal in all extremities.  Deep Tendon Reflexes: 2+ and symmetric in the biceps and patella. Cerebellar: normal finger-to-nose with bilateral upper extremities Gait: normal gait and balance Stance: No pronator drift and good coordination, strength, and position sense with tapping of bilateral arms (performed in sitting position). CV: distal pulses palpable throughout   Skin: Skin is warm and dry. No rash noted. No erythema.  Psychiatric: She has a normal mood and affect. Her behavior is normal. Judgment and thought content normal.  Nursing note and vitals reviewed.    ED Treatments / Results  Labs (all labs ordered are listed, but only abnormal results are displayed) Labs Reviewed  CBC - Abnormal; Notable  for the following components:      Result Value   Hemoglobin 8.4 (*)    HCT 29.6 (*)    MCV 70.3 (*)    MCH 20.0 (*)    MCHC 28.4 (*)    RDW 19.1 (*)    All other components within normal limits  COMPREHENSIVE METABOLIC PANEL - Abnormal; Notable for the following components:   Potassium 3.4 (*)    All other components within normal limits  PROTIME-INR  APTT  DIFFERENTIAL  I-STAT TROPONIN, ED  I-STAT BETA HCG BLOOD, ED ([facility], [facility], [facility] ONLY)  I-STAT TROPONIN, ED  CBG MONITORING, ED    EKG None  Radiology Ct Head Wo Contrast  Result Date: 09/25/2017 CLINICAL DATA:  Headache and acute onset of lethargy and weakness from 12:10 p.m. to 3 p.m. today. EXAM: CT HEAD WITHOUT CONTRAST TECHNIQUE: Contiguous axial images were obtained from the base of  the skull through the vertex without intravenous contrast. COMPARISON:  None. FINDINGS: Brain: No evidence of acute infarction, hemorrhage, hydrocephalus, extra-axial collection or mass lesion/mass effect. Vascular: No hyperdense vessel or unexpected calcification. Skull: Intact.  No focal lesion. Sinuses/Orbits: Normal. Other: None. IMPRESSION: Normal head CT. Electronically Signed   By: Inge Rise M.D.   On: 09/25/2017 16:00    Procedures Procedures (including critical care time)  Medications Ordered in ED Medications - No data to display   Initial Impression / Assessment and Plan / ED Course  I have reviewed the triage vital signs and the nursing notes.  Pertinent labs & imaging results that were available during my care of the patient were reviewed by me and considered in my medical decision making (see chart for details).  Clinical Course as of Sep 27 127  Sat Sep 26, 2017  0034 Spoke with Dr. Rory Percy of neuro hospitalist group who recommends MRI brain without contrast to look for possible stroke versus weight disease related to hypertension.  Per Dr. Rory Percy, if the MRI is negative, this is most likely related to hypertension today, patient does not need TIA work-up.     [AM]  0045 Patient reassessed.  Blood pressure normalizing.  Patient feeling well.   [AM]  0119 Patient reports persistent headache. Will provide headache cocktail minus toradol d/t blood pressure.   [AM]    Clinical Course User Index [AM] Albesa Seen, PA-C    Patient is nontoxic-appearing and neurologically intact at present.  Patient continues to have elevated blood pressures, as high as 201/108 today.  Differential diagnosis includes TIA, hypertensive emergency, ACS,  thoracic aortic dissection.  Doubt TAD, as patient has no chest pain, shortness of breath, or neurologic symptoms at present.  Patient also has equal radial pulses bilaterally.   Blood pressure spontaneously reduced.  Patient administered  medications to treat headache conservatively, holding Toradol as patient has had labile hypertension today.  Lab work otherwise remarkable for a microcytic anemia with hemoglobin of 8.4.  Patient reporting a long history of menorrhagia, as well as known fibroids of the uterus.  This hemoglobin is baseline x1 year.  I discussed with patient as well as following with PCP for referral to OB/GYN and further evaluation.  Plan per discussion with Dr. Rory Percy of neurology is to complete MRI, and if there is evidence that patient has had a stroke, she will be admitted for stroke work-up.  Otherwise, if patient has no acute findings, she will be referred back to primary care for aggressive hypertension management.  Patient has been informed of  the plan and is in agreement with the plan of care.  All questions answered.  Care signed out to Charlann Lange, PA-C at 1:34 AM.   Final Clinical Impressions(s) / ED Diagnoses   Final diagnoses:  Numbness  Bad headache  Transient alteration of awareness  Microcytic anemia    ED Discharge Orders    None       Tamala Julian 09/26/17 0136    Little, Wenda Overland, MD 10/01/17 [phone]

## 2017-09-25 NOTE — ED Triage Notes (Signed)
Patient complains of tightness and tingling in the left arm, headache, and sudden onset of lethargy and weakness lasting from 1210 until approximately 1500 today. Patient states symptoms resolved en route to ED.

## 2017-09-26 ENCOUNTER — Emergency Department (HOSPITAL_COMMUNITY): Payer: Self-pay

## 2017-09-26 LAB — RAPID URINE DRUG SCREEN, HOSP PERFORMED
AMPHETAMINES: NOT DETECTED
BENZODIAZEPINES: NOT DETECTED
Barbiturates: NOT DETECTED
Cocaine: NOT DETECTED
OPIATES: NOT DETECTED
Tetrahydrocannabinol: NOT DETECTED

## 2017-09-26 MED ORDER — PROCHLORPERAZINE EDISYLATE 10 MG/2ML IJ SOLN
10.0000 mg | Freq: Once | INTRAMUSCULAR | Status: AC
  Administered 2017-09-26: 10 mg via INTRAVENOUS
  Filled 2017-09-26: qty 2

## 2017-09-26 MED ORDER — ACETAMINOPHEN 500 MG PO TABS
1000.0000 mg | ORAL_TABLET | Freq: Once | ORAL | Status: AC
  Administered 2017-09-26: 1000 mg via ORAL
  Filled 2017-09-26: qty 2

## 2017-09-26 MED ORDER — SODIUM CHLORIDE 0.9 % IV BOLUS
1000.0000 mL | Freq: Once | INTRAVENOUS | Status: AC
  Administered 2017-09-26: 1000 mL via INTRAVENOUS

## 2017-09-26 NOTE — ED Notes (Signed)
Patient transported to MRI 

## 2017-09-26 NOTE — ED Provider Notes (Signed)
HTN history, poorly controlled Today, neuro symptoms: overcome with dizziness, disorientation, speech difficulty; left circumferential forearm numbness; headache, temporal. No gait disturbance. Resolved by the time of arrival.  Hypertensive here, improved over time here  Hypertensive emergency vs TIA Head CT negative  Neuro Rory Percy) - MRI for storke or hypertensive disease If negative, she can be discharged home, blood pressure controll If positive, re-consult neuro, admit.  MRI results:  IMPRESSION: 1. No acute intracranial abnormality identified. 2. Nonspecific T2 FLAIR hyperintense white matter foci unexpected for age may represent microvascular ischemic changes particularly in the setting of diabetes or hypertension with some differential considerations including migraine headache or sequelae of demyelination, vasculitis, and other infectious/inflammatory Processes.  Re-exam: the patient is resting comfortably. No recurrent symptoms for duration of ED encounter. VSS. Neurology paged to discuss MR results. Anticipate discharge home.  Per Dr. Rory Percy, the patient has no acute findings on MRI and can be discharged home to outpatient follow up.     Charlann Lange, PA-C 09/26/17 0440    Little, Wenda Overland, MD 09/29/17 [phone]

## 2018-05-10 ENCOUNTER — Emergency Department (HOSPITAL_COMMUNITY)
Admission: EM | Admit: 2018-05-10 | Discharge: 2018-05-10 | Disposition: A | Discharge status: Home / Self Care | Payer: PRIVATE HEALTH INSURANCE | Attending: Emergency Medicine | Admitting: Emergency Medicine

## 2018-05-10 ENCOUNTER — Emergency Department (HOSPITAL_COMMUNITY): Payer: PRIVATE HEALTH INSURANCE

## 2018-05-10 DIAGNOSIS — R0789 Other chest pain: Secondary | ICD-10-CM | POA: Insufficient documentation

## 2018-05-10 DIAGNOSIS — I10 Essential (primary) hypertension: Secondary | ICD-10-CM

## 2018-05-10 DIAGNOSIS — I110 Hypertensive heart disease with heart failure: Secondary | ICD-10-CM | POA: Insufficient documentation

## 2018-05-10 DIAGNOSIS — I509 Heart failure, unspecified: Secondary | ICD-10-CM | POA: Insufficient documentation

## 2018-05-10 DIAGNOSIS — Z79899 Other long term (current) drug therapy: Secondary | ICD-10-CM | POA: Insufficient documentation

## 2018-05-10 DIAGNOSIS — J45909 Unspecified asthma, uncomplicated: Secondary | ICD-10-CM | POA: Insufficient documentation

## 2018-05-10 LAB — COMPREHENSIVE METABOLIC PANEL
ALK PHOS: 54 U/L (ref 38–126)
ALT: 26 U/L (ref 0–44)
ANION GAP: 11 (ref 5–15)
AST: 30 U/L (ref 15–41)
Albumin: 3.8 g/dL (ref 3.5–5.0)
BUN: <5 mg/dL — ABNORMAL LOW (ref 6–20)
CALCIUM: 9 mg/dL (ref 8.9–10.3)
CO2: 24 mmol/L (ref 22–32)
CREATININE: 0.7 mg/dL (ref 0.44–1.00)
Chloride: 103 mmol/L (ref 98–111)
Glucose, Bld: 121 mg/dL — ABNORMAL HIGH (ref 70–99)
Potassium: 2.9 mmol/L — ABNORMAL LOW (ref 3.5–5.1)
Sodium: 138 mmol/L (ref 135–145)
Total Bilirubin: 1.1 mg/dL (ref 0.3–1.2)
Total Protein: 7.9 g/dL (ref 6.5–8.1)

## 2018-05-10 LAB — CBC WITH DIFFERENTIAL/PLATELET
ABS IMMATURE GRANULOCYTES: 0 10*3/uL (ref 0.00–0.07)
BASOS PCT: 4 %
Basophils Absolute: 0.2 10*3/uL — ABNORMAL HIGH (ref 0.0–0.1)
Eosinophils Absolute: 0.2 10*3/uL (ref 0.0–0.5)
Eosinophils Relative: 4 %
HCT: 31.5 % — ABNORMAL LOW (ref 36.0–46.0)
HEMOGLOBIN: 8.4 g/dL — AB (ref 12.0–15.0)
Lymphocytes Relative: 19 %
Lymphs Abs: 0.9 10*3/uL (ref 0.7–4.0)
MCH: 18.3 pg — AB (ref 26.0–34.0)
MCHC: 26.7 g/dL — AB (ref 30.0–36.0)
MCV: 68.5 fL — ABNORMAL LOW (ref 80.0–100.0)
MONO ABS: 0.2 10*3/uL (ref 0.1–1.0)
Monocytes Relative: 4 %
NEUTROS ABS: 3.1 10*3/uL (ref 1.7–7.7)
Neutrophils Relative %: 69 %
Platelets: 295 10*3/uL (ref 150–400)
RBC: 4.6 MIL/uL (ref 3.87–5.11)
RDW: 20.6 % — ABNORMAL HIGH (ref 11.5–15.5)
WBC: 4.5 10*3/uL (ref 4.0–10.5)
nRBC: 0 % (ref 0.0–0.2)
nRBC: 0 /100 WBC

## 2018-05-10 LAB — I-STAT TROPONIN, ED
Troponin i, poc: 0 ng/mL (ref 0.00–0.08)
Troponin i, poc: 0 ng/mL (ref 0.00–0.08)

## 2018-05-10 MED ORDER — LABETALOL HCL 5 MG/ML IV SOLN
10.0000 mg | Freq: Once | INTRAVENOUS | Status: AC
  Administered 2018-05-10: 10 mg via INTRAVENOUS
  Filled 2018-05-10: qty 4

## 2018-05-10 MED ORDER — AMLODIPINE BESYLATE 10 MG PO TABS: 10.0000 mg | ORAL_TABLET | Freq: Every day | ORAL | 0 refills | Status: DC

## 2018-05-10 MED ORDER — POTASSIUM CHLORIDE CRYS ER 20 MEQ PO TBCR
40.0000 meq | EXTENDED_RELEASE_TABLET | Freq: Once | ORAL | Status: AC
  Administered 2018-05-10: 40 meq via ORAL
  Filled 2018-05-10: qty 2

## 2018-05-10 MED ORDER — TRIAMTERENE-HCTZ 37.5-25 MG PO CAPS: 1.0000 | ORAL_CAPSULE | Freq: Every day | ORAL | 0 refills | Status: DC

## 2018-05-10 MED ORDER — ACETAMINOPHEN 325 MG PO TABS
650.0000 mg | ORAL_TABLET | Freq: Once | ORAL | Status: AC
  Administered 2018-05-10: 650 mg via ORAL
  Filled 2018-05-10: qty 2

## 2018-05-10 NOTE — ED Notes (Signed)
Pt alert and oriented in NAD. Pt verbalized understanding of discharge instructions and had no additional questions.

## 2018-05-10 NOTE — ED Notes (Signed)
Patient is resting comfortably. 

## 2018-05-10 NOTE — ED Provider Notes (Signed)
[facility] EMERGENCY DEPARTMENT Provider Note   CSN: [id number] Arrival date & time: 05/10/18  1112     History   Chief Complaint Chief Complaint  Patient presents with  . Chest Pain  . Dizziness    HPI [name] is a 48 y.o. female.  HPI Patient has a history of hypertension.  States she is been noncompliant with her medications and did not take any of her medications this morning.  Was at work and had lightheadedness and central chest pain.  Chest pain did not radiate.  Checked her blood pressure at work and it was elevated. Past Medical History:  Diagnosis Date  . Blood transfusion without reported diagnosis   . GERD (gastroesophageal reflux disease)   . Hypertension   . Multinodular goiter     Patient Active Problem List   Diagnosis Date Noted  . Multinodular goiter (nontoxic) 12/22/2015  . Asthma 11/19/2015  . Essential hypertension 08/29/2015  . Gastroesophageal reflux disease without esophagitis 08/29/2015  . Allergic rhinitis due to pollen 08/29/2015  . Hepatitis B immune 02/24/2014  . CHF exacerbation ([place]) 10/18/2013  . Dyspnea 10/18/2013  . Chest pain 10/18/2013  . Cardiomegaly 08/23/2012  . Fibroid, uterine 03/17/2012  . HYPERTENSION 02/02/2007    Past Surgical History:  Procedure Laterality Date  . ENDOMETRIAL ABLATION       OB History   No obstetric history on file.      Home Medications    Prior to Admission medications   Medication Sig Start Date End Date Taking? Authorizing Provider  amLODipine (NORVASC) 10 MG tablet Take 1 tablet (10 mg total) by mouth daily. 05/10/18   Julianne Rice, MD  bisoprolol (ZEBETA) 10 MG tablet Take 1 tablet (10 mg total) by mouth daily. 07/10/16   Wardell Honour, MD  triamterene-hydrochlorothiazide (DYAZIDE) 37.5-25 MG capsule Take 1 each (1 capsule total) by mouth daily. 05/10/18   Julianne Rice, MD    Family History Family History  Problem Relation Age of Onset  .  Hypertension Mother   . Hypertension Father   . Diabetes Father   . Diabetes Sister   . Hypertension Sister   . Hypertension Brother   . Hypertension Son   . Cancer Paternal Aunt   . Diabetes Maternal Grandmother   . Hypertension Maternal Grandmother   . Diabetes Paternal Grandmother   . Hypertension Paternal Grandmother   . Diabetes Sister   . Asthma Daughter     Social History Social History   Tobacco Use  . Smoking status: Never Smoker  . Smokeless tobacco: Never Used  Substance Use Topics  . Alcohol use: Yes    Alcohol/week: 10.0 standard drinks    Types: 5 Cans of beer, 5 Shots of liquor per week    Comment: just on weekends  . Drug use: No     Allergies   Patient has no known allergies.   Review of Systems Review of Systems  Constitutional: Negative for chills and fever.  HENT: Negative for sore throat and trouble swallowing.   Eyes: Negative for visual disturbance.  Respiratory: Negative for cough and shortness of breath.   Cardiovascular: Positive for chest pain. Negative for palpitations and leg swelling.  Gastrointestinal: Negative for abdominal pain, constipation, diarrhea, nausea and vomiting.  Genitourinary: Negative for dysuria, flank pain and frequency.  Musculoskeletal: Negative for back pain, joint swelling, myalgias, neck pain and neck stiffness.  Skin: Negative for rash and wound.  Neurological: Positive for light-headedness. Negative for  syncope, weakness, numbness and headaches.  All other systems reviewed and are negative.    Physical Exam Updated Vital Signs BP (!) 195/125   Pulse 68   Temp 98.6 F (37 C)   Resp 16   SpO2 99%   Physical Exam Vitals signs and nursing note reviewed.  Constitutional:      General: She is not in acute distress.    Appearance: Normal appearance. She is well-developed. She is not ill-appearing.  HENT:     Head: Normocephalic and atraumatic.     Nose: Nose normal.     Mouth/Throat:     Mouth: Mucous  membranes are moist.  Eyes:     Extraocular Movements: Extraocular movements intact.     Conjunctiva/sclera: Conjunctivae normal.     Pupils: Pupils are equal, round, and reactive to light.  Neck:     Musculoskeletal: Normal range of motion and neck supple. No neck rigidity or muscular tenderness.     Vascular: No carotid bruit.  Cardiovascular:     Rate and Rhythm: Normal rate and regular rhythm.     Heart sounds: No murmur. No friction rub. No gallop.   Pulmonary:     Effort: Pulmonary effort is normal. No respiratory distress.     Breath sounds: Normal breath sounds. No stridor. No wheezing, rhonchi or rales.  Chest:     Chest wall: No tenderness.  Abdominal:     General: Bowel sounds are normal.     Palpations: Abdomen is soft.     Tenderness: There is no abdominal tenderness. There is no guarding or rebound.  Musculoskeletal: Normal range of motion.        General: No swelling, tenderness, deformity or signs of injury.     Right lower leg: No edema.     Left lower leg: No edema.  Lymphadenopathy:     Cervical: No cervical adenopathy.  Skin:    General: Skin is warm and dry.     Capillary Refill: Capillary refill takes less than 2 seconds.     Findings: No erythema or rash.  Neurological:     General: No focal deficit present.     Mental Status: She is alert and oriented to person, place, and time.     Comments: Patient is alert and oriented x3 with clear, goal oriented speech. Patient has 5/5 motor in all extremities. Sensation is intact to light touch. Bilateral finger-to-nose is normal with no signs of dysmetria. Patient has a normal gait and walks without assistance.  Psychiatric:        Mood and Affect: Mood normal.        Behavior: Behavior normal.      ED Treatments / Results  Labs (all labs ordered are listed, but only abnormal results are displayed) Labs Reviewed  CBC WITH DIFFERENTIAL/PLATELET - Abnormal; Notable for the following components:      Result  Value   Hemoglobin 8.4 (*)    HCT 31.5 (*)    MCV 68.5 (*)    MCH 18.3 (*)    MCHC 26.7 (*)    RDW 20.6 (*)    Basophils Absolute 0.2 (*)    All other components within normal limits  COMPREHENSIVE METABOLIC PANEL - Abnormal; Notable for the following components:   Potassium 2.9 (*)    Glucose, Bld 121 (*)    BUN <5 (*)    All other components within normal limits  I-STAT TROPONIN, ED  I-STAT TROPONIN, ED    EKG EKG  Interpretation  Date/Time:  Monday May 10 2018 11:24:19 EST Ventricular Rate:  77 PR Interval:  160 QRS Duration: 92 QT Interval:  410 QTC Calculation: 463 R Axis:   11 Text Interpretation:  Normal sinus rhythm Possible Left atrial enlargement Left ventricular hypertrophy Abnormal ECG Confirmed by Julianne Rice [phone]) on 05/10/2018 12:29:49 PM   Radiology Dg Chest 2 View  Result Date: 05/10/2018 CLINICAL DATA:  Chest pain EXAM: CHEST - 2 VIEW COMPARISON:  January 23, 2017 FINDINGS: The lungs are clear. The heart size and pulmonary vascularity are normal. No adenopathy. No pneumothorax. No bone lesions. IMPRESSION: No edema or consolidation. Electronically Signed   By: Lowella Grip III M.D.   On: 05/10/2018 13:31    Procedures Procedures (including critical care time)  Medications Ordered in ED Medications  labetalol (NORMODYNE,TRANDATE) injection 10 mg (10 mg Intravenous Given 05/10/18 1255)  labetalol (NORMODYNE,TRANDATE) injection 10 mg (10 mg Intravenous Given 05/10/18 1431)  potassium chloride SA (K-DUR,KLOR-CON) CR tablet 40 mEq (40 mEq Oral Given 05/10/18 1431)  acetaminophen (TYLENOL) tablet 650 mg (650 mg Oral Given 05/10/18 1524)     Initial Impression / Assessment and Plan / ED Course  I have reviewed the triage vital signs and the nursing notes.  Pertinent labs & imaging results that were available during my care of the patient were reviewed by me and considered in my medical decision making (see chart for details).    Chest pain  is atypical for coronary artery disease.  Troponin x2 is normal.  Headache is improved with Tylenol.  Low suspicion for intracranial bleed.  Patient states her baseline blood pressure is in the 967E to 938B systolic.  We will start her back on her blood pressure medications.  She is been advised to follow-up closely with a primary physician and should also follow-up with a cardiologist.  Have given oral potassium replacement.  Return precautions have been given.   Final Clinical Impressions(s) / ED Diagnoses   Final diagnoses:  Hypertension, unspecified type  Atypical chest pain    ED Discharge Orders         Ordered    amLODipine (NORVASC) 10 MG tablet  Daily     05/10/18 1612    triamterene-hydrochlorothiazide (DYAZIDE) 37.5-25 MG capsule  Daily     05/10/18 1612           Julianne Rice, MD 05/11/18 [phone]

## 2018-05-10 NOTE — ED Triage Notes (Signed)
Pt here from work with c/o dizzy and chest pain with some sob , along with some nausea

## 2018-05-10 NOTE — Discharge Instructions (Signed)
Take your blood pressure take your blood pressure medication as prescribed.  Follow-up with cardiology and your primary physician.

## 2018-12-13 ENCOUNTER — Observation Stay (HOSPITAL_COMMUNITY)
Admission: EM | Admit: 2018-12-13 | Discharge: 2018-12-14 | Disposition: A | Discharge status: Home / Self Care | Payer: Managed Care, Other (non HMO) | Attending: Internal Medicine | Admitting: Internal Medicine

## 2018-12-13 ENCOUNTER — Emergency Department (HOSPITAL_COMMUNITY): Payer: Managed Care, Other (non HMO)

## 2018-12-13 DIAGNOSIS — Z7982 Long term (current) use of aspirin: Secondary | ICD-10-CM | POA: Insufficient documentation

## 2018-12-13 DIAGNOSIS — Z6834 Body mass index (BMI) 34.0-34.9, adult: Secondary | ICD-10-CM | POA: Diagnosis not present

## 2018-12-13 DIAGNOSIS — D649 Anemia, unspecified: Secondary | ICD-10-CM | POA: Diagnosis not present

## 2018-12-13 DIAGNOSIS — K219 Gastro-esophageal reflux disease without esophagitis: Secondary | ICD-10-CM | POA: Diagnosis not present

## 2018-12-13 DIAGNOSIS — Z1159 Encounter for screening for other viral diseases: Secondary | ICD-10-CM | POA: Insufficient documentation

## 2018-12-13 DIAGNOSIS — E876 Hypokalemia: Secondary | ICD-10-CM | POA: Diagnosis present

## 2018-12-13 DIAGNOSIS — I10 Essential (primary) hypertension: Secondary | ICD-10-CM | POA: Diagnosis not present

## 2018-12-13 DIAGNOSIS — J45909 Unspecified asthma, uncomplicated: Secondary | ICD-10-CM | POA: Diagnosis present

## 2018-12-13 DIAGNOSIS — R7989 Other specified abnormal findings of blood chemistry: Secondary | ICD-10-CM | POA: Insufficient documentation

## 2018-12-13 DIAGNOSIS — E049 Nontoxic goiter, unspecified: Secondary | ICD-10-CM | POA: Diagnosis present

## 2018-12-13 DIAGNOSIS — N179 Acute kidney failure, unspecified: Secondary | ICD-10-CM | POA: Diagnosis not present

## 2018-12-13 DIAGNOSIS — J9601 Acute respiratory failure with hypoxia: Secondary | ICD-10-CM | POA: Diagnosis present

## 2018-12-13 DIAGNOSIS — I161 Hypertensive emergency: Secondary | ICD-10-CM | POA: Insufficient documentation

## 2018-12-13 DIAGNOSIS — I517 Cardiomegaly: Secondary | ICD-10-CM | POA: Diagnosis not present

## 2018-12-13 DIAGNOSIS — D259 Leiomyoma of uterus, unspecified: Secondary | ICD-10-CM | POA: Insufficient documentation

## 2018-12-13 DIAGNOSIS — I160 Hypertensive urgency: Secondary | ICD-10-CM

## 2018-12-13 DIAGNOSIS — E669 Obesity, unspecified: Secondary | ICD-10-CM | POA: Diagnosis not present

## 2018-12-13 DIAGNOSIS — R0789 Other chest pain: Principal | ICD-10-CM | POA: Insufficient documentation

## 2018-12-13 DIAGNOSIS — Z79899 Other long term (current) drug therapy: Secondary | ICD-10-CM | POA: Insufficient documentation

## 2018-12-13 DIAGNOSIS — R778 Other specified abnormalities of plasma proteins: Secondary | ICD-10-CM | POA: Diagnosis present

## 2018-12-13 DIAGNOSIS — R079 Chest pain, unspecified: Secondary | ICD-10-CM | POA: Diagnosis present

## 2018-12-13 LAB — I-STAT BETA HCG BLOOD, ED (MC, WL, AP ONLY): I-stat hCG, quantitative: <5 m[IU]/mL (ref <5)

## 2018-12-13 LAB — CBC
HCT: 35.3 % — ABNORMAL LOW (ref 36.0–46.0)
Hemoglobin: 10.1 g/dL — ABNORMAL LOW (ref 12.0–15.0)
MCH: 21 pg — ABNORMAL LOW (ref 26.0–34.0)
MCHC: 28.6 g/dL — ABNORMAL LOW (ref 30.0–36.0)
MCV: 73.5 fL — ABNORMAL LOW (ref 80.0–100.0)
Platelets: 294 10*3/uL (ref 150–400)
RBC: 4.8 MIL/uL (ref 3.87–5.11)
RDW: 18.9 % — ABNORMAL HIGH (ref 11.5–15.5)
WBC: 10.5 10*3/uL (ref 4.0–10.5)
nRBC: 0 % (ref 0.0–0.2)

## 2018-12-13 LAB — BASIC METABOLIC PANEL
Anion gap: 14 (ref 5–15)
BUN: 14 mg/dL (ref 6–20)
CO2: 23 mmol/L (ref 22–32)
Calcium: 9.4 mg/dL (ref 8.9–10.3)
Chloride: 98 mmol/L (ref 98–111)
Creatinine, Ser: 1.2 mg/dL — ABNORMAL HIGH (ref 0.44–1.00)
GFR calc Af Amer: >60 mL/min (ref >60)
GFR calc non Af Amer: 53 mL/min — ABNORMAL LOW (ref >60)
Glucose, Bld: 97 mg/dL (ref 70–99)
Potassium: 3.4 mmol/L — ABNORMAL LOW (ref 3.5–5.1)
Sodium: 135 mmol/L (ref 135–145)

## 2018-12-13 LAB — TROPONIN I (HIGH SENSITIVITY)
Troponin I (High Sensitivity): 22 ng/L — ABNORMAL HIGH (ref <18)
Troponin I (High Sensitivity): 24 ng/L — ABNORMAL HIGH (ref <18)

## 2018-12-13 LAB — MAGNESIUM: Magnesium: 1.8 mg/dL (ref 1.7–2.4)

## 2018-12-13 MED ORDER — ASPIRIN 81 MG PO CHEW
324.0000 mg | CHEWABLE_TABLET | Freq: Once | ORAL | Status: AC
  Administered 2018-12-13: 324 mg via ORAL
  Filled 2018-12-13: qty 4

## 2018-12-13 MED ORDER — POTASSIUM CHLORIDE CRYS ER 20 MEQ PO TBCR: 40.0000 meq | EXTENDED_RELEASE_TABLET | Freq: Once | ORAL | Status: DC

## 2018-12-13 MED ORDER — HYDROMORPHONE HCL 1 MG/ML IJ SOLN
1.0000 mg | Freq: Once | INTRAMUSCULAR | Status: AC
  Administered 2018-12-13: 1 mg via INTRAVENOUS
  Filled 2018-12-13: qty 1

## 2018-12-13 MED ORDER — NITROGLYCERIN 0.4 MG SL SUBL
0.4000 mg | SUBLINGUAL_TABLET | SUBLINGUAL | Status: DC | PRN
  Filled 2018-12-13: qty 1

## 2018-12-13 MED ORDER — POTASSIUM CHLORIDE CRYS ER 20 MEQ PO TBCR
40.0000 meq | EXTENDED_RELEASE_TABLET | Freq: Once | ORAL | Status: AC
  Administered 2018-12-13: 40 meq via ORAL
  Filled 2018-12-13: qty 2

## 2018-12-13 MED ORDER — SODIUM CHLORIDE 0.9% FLUSH
3.0000 mL | Freq: Once | INTRAVENOUS | Status: AC
  Administered 2018-12-13: 3 mL via INTRAVENOUS

## 2018-12-13 MED ORDER — BISOPROLOL FUMARATE 10 MG PO TABS
10.0000 mg | ORAL_TABLET | Freq: Once | ORAL | Status: AC
  Administered 2018-12-13: 10 mg via ORAL
  Filled 2018-12-13: qty 1

## 2018-12-13 MED ORDER — AMLODIPINE BESYLATE 5 MG PO TABS
10.0000 mg | ORAL_TABLET | Freq: Once | ORAL | Status: AC
  Administered 2018-12-13: 10 mg via ORAL
  Filled 2018-12-13: qty 2

## 2018-12-13 MED ORDER — HYDROCHLOROTHIAZIDE 25 MG PO TABS
25.0000 mg | ORAL_TABLET | Freq: Once | ORAL | Status: AC
  Administered 2018-12-13: 25 mg via ORAL
  Filled 2018-12-13: qty 1

## 2018-12-13 MED ORDER — IOHEXOL 350 MG/ML SOLN
100.0000 mL | Freq: Once | INTRAVENOUS | Status: AC | PRN
  Administered 2018-12-13: 100 mL via INTRAVENOUS

## 2018-12-13 NOTE — H&P (Signed)
[name] MGQ:[id number] DOB: [birth year] DOA: 12/13/2018     PCP: Hayden Rasmussen, MD   Outpatient Specialists:   CARDS:   Dr. Johnsie Cancel    Patient arrived to ER on 12/13/18 at 1511  Patient coming from: home Lives   With family    Chief Complaint:  Chief Complaint  Patient presents with   Chest Pain   Hypertension    HPI: [name] is a 48 y.o. female with medical history significant of HTN, anemia    Presented with   Chest pain  Been out of blood pressure medications for the past 1 week at work her blood pressure was above 200 And she was told by urgent care to go down to emergency department she has been feeling some pressure in her chest for the past few days also some associated back pain and neck pain. Reports Neck pain started this weekend but she have had that before  It has been persistent for few days No fever no chills  Been having some associated headache which usually happens when her blood pressure goes up.  Her left side and neck has been hurting when she tries to move it.  She is unsure about the etiology of this.  Describes chest pain as sharp under her left breast and feels like pressure like she needs to burp.  She has been having history of atypical chest pains in the past. Having some blurry vision but that is chronic and has not changed.  She has known history of anemia- hx of fibroids with heavy periods  She has been feeling lightheaded lately  Denies tobacco Occasional EtOh She has been taking aspirin and ibuprofen for her headache  Infectious risk factors:  Reports  chest pain,     In  ER RAPID COVID TEST  in house testing   Pending     Regarding pertinent Chronic problems:     HTN on Norvasc, Dyazide, bisoprolol    obesity-   BMI Readings from Last 1 Encounters:  01/23/17 34.20 kg/m       While in ER: CTA was negative No pe no dissection Was treated with dilaudid  Was given nitro and pain medication and BP  improved  The following Work up has been ordered so far:  Orders Placed This Encounter  Procedures   Critical Care   SARS CORONAVIRUS 2 Nasal Swab Aptima Multi Swab   DG Chest 2 View   CT Angio Chest/Abd/Pel for Dissection W and/or Wo Contrast   Basic metabolic panel   CBC   Magnesium   Urine rapid drug screen (hosp performed)   Urinalysis, Routine w reflex microscopic   TSH   T4   T3, free   Vitamin B12   Folate   Iron and TIBC   Ferritin   Reticulocytes   Occult blood card to lab, stool RN will collect   Diet NPO time specified   Cardiac monitoring   Saline Lock IV, Maintain IV access   Cardiac monitoring   Consult to hospitalist  ALL PATIENTS BEING ADMITTED/HAVING PROCEDURES NEED COVID-19 SCREENING   Consult to hospitalist  ALL PATIENTS BEING ADMITTED/HAVING PROCEDURES NEED COVID-19 SCREENING   Consult to care management   Pulse oximetry, continuous   I-Stat beta hCG blood, ED   ED EKG within 10 minutes   EKG 12-Lead   Repeat EKG   Place in observation (patient's expected length of stay will be less than 2 midnights)   Place  in observation (patient's expected length of stay will be less than 2 midnights)    Following Medications were ordered in ER: Medications  nitroGLYCERIN (NITROSTAT) SL tablet 0.4 mg (has no administration in time range)  hydrALAZINE (APRESOLINE) injection 10 mg (10 mg Intravenous Given 12/14/18 0014)  sodium chloride flush (NS) 0.9 % injection 3 mL (3 mLs Intravenous Given 12/13/18 2240)  HYDROmorphone (DILAUDID) injection 1 mg (1 mg Intravenous Given 12/13/18 1859)  aspirin chewable tablet 324 mg (324 mg Oral Given 12/13/18 1858)  iohexol (OMNIPAQUE) 350 MG/ML injection 100 mL (100 mLs Intravenous Contrast Given 12/13/18 1954)  potassium chloride SA (K-DUR) CR tablet 40 mEq (40 mEq Oral Given 12/13/18 2251)  amLODipine (NORVASC) tablet 10 mg (10 mg Oral Given 12/13/18 2251)  hydrochlorothiazide (HYDRODIURIL) tablet 25  mg (25 mg Oral Given 12/13/18 2251)  bisoprolol (ZEBETA) tablet 10 mg (10 mg Oral Given 12/13/18 2251)        Consult Orders  (From admission, onward)         Start     Ordered   12/13/18 2343  Consult to care management  Once    Provider:  (Not yet assigned)  Question:  Reason for consult:  Answer:  Medication needs   12/13/18 2342   12/13/18 2147  Consult to hospitalist  ALL PATIENTS BEING ADMITTED/HAVING PROCEDURES NEED COVID-19 SCREENING  Once    Comments: ALL PATIENTS BEING ADMITTED/HAVING PROCEDURES NEED COVID-19 SCREENING  Provider:  (Not yet assigned)  Question Answer Comment  Place call to: [facility]   Reason for Consult Admit      12/13/18 2146   12/13/18 2106  Consult to hospitalist  ALL PATIENTS BEING ADMITTED/HAVING PROCEDURES NEED COVID-19 SCREENING  Once    Comments: ALL PATIENTS BEING ADMITTED/HAVING PROCEDURES NEED COVID-19 SCREENING  Provider:  (Not yet assigned)  Question Answer Comment  Place call to: [facility]   Reason for Consult Admit      12/13/18 2105          Significant initial  Findings: Abnormal Labs Reviewed  BASIC METABOLIC PANEL - Abnormal; Notable for the following components:      Result Value   Potassium 3.4 (*)    Creatinine, Ser 1.20 (*)    GFR calc non Af Amer 53 (*)    All other components within normal limits  CBC - Abnormal; Notable for the following components:   Hemoglobin 10.1 (*)    HCT 35.3 (*)    MCV 73.5 (*)    MCH 21.0 (*)    MCHC 28.6 (*)    RDW 18.9 (*)    All other components within normal limits  URINALYSIS, ROUTINE W REFLEX MICROSCOPIC - Abnormal; Notable for the following components:   Color, Urine RED (*)    APPearance HAZY (*)    Specific Gravity, Urine 1.044 (*)    Hgb urine dipstick LARGE (*)    Protein, ur 100 (*)    RBC / HPF >50 (*)    All other components within normal limits  TROPONIN I (HIGH SENSITIVITY) - Abnormal; Notable for the following components:   Troponin I (High  Sensitivity) 22 (*)    All other components within normal limits  TROPONIN I (HIGH SENSITIVITY) - Abnormal; Notable for the following components:   Troponin I (High Sensitivity) 24 (*)    All other components within normal limits     Otherwise labs showing:    Recent Labs  Lab 12/13/18 1527 12/13/18 1826  NA 135  --  K 3.4*  --   CO2 23  --   GLUCOSE 97  --   BUN 14  --   CREATININE 1.20*  --   CALCIUM 9.4  --   MG  --  1.8    Cr    Up from baseline see below Lab Results  Component Value Date   CREATININE 1.20 (H) 12/13/2018   CREATININE 0.70 05/10/2018   CREATININE 0.88 09/25/2017    No results for input(s): AST, ALT, ALKPHOS, BILITOT, PROT, ALBUMIN in the last 168 hours. Lab Results  Component Value Date   CALCIUM 9.4 12/13/2018   PHOS 4.6 07/24/2007       WBC      Component Value Date/Time   WBC 10.5 12/13/2018 1527   ANC    Component Value Date/Time   NEUTROABS 3.1 05/10/2018 1247    Plt: Lab Results  Component Value Date   PLT 294 12/13/2018     Lactic Acid, Venous    Component Value Date/Time   LATICACIDVEN 0.66 06/25/2016 2230     COVID-19 Labs  No results for input(s): DDIMER, FERRITIN, LDH, CRP in the last 72 hours.  No results found for: SARSCOV2NAA    ABG    Component Value Date/Time   TCO2 23 04/25/2014 0917      HG/HCT stable,      Component Value Date/Time   HGB 10.1 (L) 12/13/2018 1527   HCT 35.3 (L) 12/13/2018 1527     Troponin 22- 24 Cardiac Panel (last 3 results) No results for input(s): CKTOTAL, CKMB, TROPONINI, RELINDX in the last 72 hours.       UA no UTI   Urine analysis:      Component Value Date/Time   COLORURINE RED (A) 12/13/2018 2346   APPEARANCEUR HAZY (A) 12/13/2018 2346   LABSPEC 1.044 (H) 12/13/2018 2346   PHURINE 6.0 12/13/2018 2346   GLUCOSEU NEGATIVE 12/13/2018 2346   HGBUR LARGE (A) 12/13/2018 2346   BILIRUBINUR NEGATIVE 12/13/2018 2346   BILIRUBINUR negative 07/10/2016 1213    BILIRUBINUR neg 07/29/2012 0937   KETONESUR NEGATIVE 12/13/2018 2346   PROTEINUR 100 (A) 12/13/2018 2346   UROBILINOGEN 0.2 07/10/2016 1213   UROBILINOGEN 0.2 07/09/2010 1842   NITRITE NEGATIVE 12/13/2018 2346   LEUKOCYTESUR NEGATIVE 12/13/2018 2346     CXR -  NON acute  CTabd/pelvis/CHEST - non-acute, no dissection Multinodular goiter noted and cardiomegaly    ECG:  Personally reviewed by me showing: HR : 81 Rhythm: NSR,  nonspecific changes,  QTC 478     ED Triage Vitals  Enc Vitals Group     BP 12/13/18 1521 (!) 221/130     Pulse Rate 12/13/18 1521 90     Resp 12/13/18 1521 16     Temp 12/13/18 1722 98.9 F (37.2 C)     Temp Source 12/13/18 1722 Oral     SpO2 12/13/18 1521 99 %     Weight --      Height --      Head Circumference --      Peak Flow --      Pain Score 12/13/18 1522 5     Pain Loc --      Pain Edu? --      Excl. in [place]? --   TMAX(24)@       Latest  Blood pressure (!) 199/113, pulse (!) 56, temperature 98.9 F (37.2 C), temperature source Oral, resp. rate 14, SpO2 100 %.     Hospitalist was  called for admission for hypertensive emergency in the setting of noncompliance with evidence of end organ damage such as elevated troponin and AKI   Review of Systems:    Pertinent positives include:  chest pain, back pain neck pain  Constitutional:  No weight loss, night sweats, Fevers, chills, fatigue, weight loss  HEENT:  No headaches, Difficulty swallowing,Tooth/dental problems,Sore throat,  No sneezing, itching, ear ache, nasal congestion, post nasal drip,  Cardio-vascular:  NoOrthopnea, PND, anasarca, dizziness, palpitations.no Bilateral lower extremity swelling  GI:  No heartburn, indigestion, abdominal pain, nausea, vomiting, diarrhea, change in bowel habits, loss of appetite, melena, blood in stool, hematemesis Resp:  no shortness of breath at rest. No dyspnea on exertion, No excess mucus, no productive cough, No non-productive cough, No  coughing up of blood.No change in color of mucus.No wheezing. Skin:  no rash or lesions. No jaundice GU:  no dysuria, change in color of urine, no urgency or frequency. No straining to urinate.  No flank pain.  Musculoskeletal:  No joint pain or no joint swelling. No decreased range of motion  Psych:  No change in mood or affect. No depression or anxiety. No memory loss.  Neuro: no localizing neurological complaints, no tingling, no weakness, no double vision, no gait abnormality, no slurred speech, no confusion  All systems reviewed and apart from [place] all are negative  Past Medical History:   Past Medical History:  Diagnosis Date   Blood transfusion without reported diagnosis    GERD (gastroesophageal reflux disease)    Hypertension    Multinodular goiter       Past Surgical History:  Procedure Laterality Date   ENDOMETRIAL ABLATION      Social History:  Ambulatory   Independently      reports that she has never smoked. She has never used smokeless tobacco. She reports current alcohol use of about 10.0 standard drinks of alcohol per week. She reports that she does not use drugs.   Family History:   Family History  Problem Relation Age of Onset   Hypertension Mother    Hypertension Father    Diabetes Father    Diabetes Sister    Hypertension Sister    Hypertension Brother    Hypertension Son    Cancer Paternal Aunt    Diabetes Maternal Grandmother    Hypertension Maternal Grandmother    Diabetes Paternal Grandmother    Hypertension Paternal Grandmother    Diabetes Sister    Asthma Daughter     Allergies: No Known Allergies   Prior to Admission medications   Medication Sig Start Date End Date Taking? Authorizing Provider  amLODipine (NORVASC) 10 MG tablet Take 1 tablet (10 mg total) by mouth daily. 05/10/18  Yes Julianne Rice, MD  aspirin EC 81 MG tablet Take 81 mg by mouth daily.   Yes [provider]  ibuprofen (ADVIL)  200 MG tablet Take 400 mg by mouth every 6 (six) hours as needed for headache or mild pain.   Yes [provider]  triamterene-hydrochlorothiazide (DYAZIDE) 37.5-25 MG capsule Take 1 each (1 capsule total) by mouth daily. 05/10/18  Yes Julianne Rice, MD  bisoprolol (ZEBETA) 10 MG tablet Take 1 tablet (10 mg total) by mouth daily. Patient not taking: Reported on 12/13/2018 07/10/16   Wardell Honour, MD   Physical Exam: Blood pressure (!) 199/113, pulse (!) 56, temperature 98.9 F (37.2 C), temperature source Oral, resp. rate 14, SpO2 100 %. 1. General:  in No  Acute distress  Chronically ill  -appearing 2. Psychological: Alert and  Oriented 3. Head/ENT:    Dry Mucous Membranes                          Head Non traumatic, neck supple                           Poor Dentition 4. SKIN:   ecreased Skin turgor,  Skin clean Dry and intact no rash 5. Heart: Regular rate and rhythm no Murmur, no Rub or gallop 6. Lungs:  no wheezes or crackles   7. Abdomen: Soft,  non-tender, Non distended  Obese bowel sounds present 8. Lower extremities: no clubbing, cyanosis, no  edema 9. Neurologically Grossly intact, moving all 4 extremities equally  10. MSK: Normal range of motion   All other LABS:     Recent Labs  Lab 12/13/18 1527  WBC 10.5  HGB 10.1*  HCT 35.3*  MCV 73.5*  PLT 294     Recent Labs  Lab 12/13/18 1527 12/13/18 1826  NA 135  --   K 3.4*  --   CL 98  --   CO2 23  --   GLUCOSE 97  --   BUN 14  --   CREATININE 1.20*  --   CALCIUM 9.4  --   MG  --  1.8     No results for input(s): AST, ALT, ALKPHOS, BILITOT, PROT, ALBUMIN in the last 168 hours.     Cultures:    Component Value Date/Time   SDES BLOOD LEFT HAND 07/23/2007 0020   SPECREQUEST BOTTLES DRAWN AEROBIC ONLY 2CC 07/23/2007 0020   CULT NO GROWTH 5 DAYS 07/23/2007 0020   REPTSTATUS 07/29/2007 FINAL 07/23/2007 0020     Radiological Exams on Admission: Dg Chest 2 View  Result Date:  12/13/2018 CLINICAL DATA:  Chest pain EXAM: CHEST - 2 VIEW COMPARISON:  05/10/2018 FINDINGS: The heart size and mediastinal contours are within normal limits. Both lungs are clear. The visualized skeletal structures are unremarkable. IMPRESSION: No active cardiopulmonary disease. Electronically Signed   By: Davina Poke M.D.   On: 12/13/2018 16:01   Ct Angio Chest/abd/pel For Dissection W And/or Wo Contrast  Result Date: 12/13/2018 CLINICAL DATA:  48 year old female with acute chest, abdominal and back pain. EXAM: CT ANGIOGRAPHY CHEST, ABDOMEN AND PELVIS TECHNIQUE: Multidetector CT imaging through the chest, abdomen and pelvis was performed using the standard protocol during bolus administration of intravenous contrast. Multiplanar reconstructed images and MIPs were obtained and reviewed to evaluate the vascular anatomy. CONTRAST:  178mL OMNIPAQUE IOHEXOL 350 MG/ML SOLN COMPARISON:  Multiple prior thyroid ultrasounds. 12/13/2018 and prior chest radiographs. FINDINGS: CTA CHEST FINDINGS Cardiovascular: Cardiomegaly noted with a LEFT chamber enlargement. There is no evidence of thoracic aortic aneurysm or dissection. No pulmonary emboli are identified. No pericardial effusion. Mediastinum/Nodes: Multinodular goiter again identified. No mediastinal mass or enlarged lymph nodes. No definite breast abnormalities identified. The visualized trachea and esophagus are unremarkable. Lungs/Pleura: 4 LEFT LOWER lobe pulmonary nodules are identified, 1 which is near completely calcified and 1 which is partially calcified. The largest nodule measures 5 mm (series 7: Image 87). The lungs are otherwise clear. There is no evidence of pleural effusion, pneumothorax, mass, airspace disease or consolidation. Musculoskeletal: No acute or suspicious bony abnormalities identified. Review of the MIP images confirms the above findings. CTA ABDOMEN AND PELVIS FINDINGS VASCULAR No vascular abnormalities are identified. There  is no  evidence of abdominal aortic aneurysm or dissection. No branch vessel abnormalities are noted. Review of the MIP images confirms the above findings. NON-VASCULAR Hepatobiliary: The liver and gallbladder are unremarkable. No biliary dilatation. Pancreas: Unremarkable Spleen: Unremarkable Adrenals/Urinary Tract: The kidneys, bladder and adrenal glands are unremarkable. Stomach/Bowel: Stomach is within normal limits. Appendix appears normal. No evidence of bowel wall thickening, distention, or inflammatory changes. Lymphatic: No enlarged lymph nodes identified. Reproductive: The uterus is markedly enlarged measuring 9 x 11.1 x 12 cm likely representing a fibroid uterus. No adnexal abnormalities are identified. Other: No ascites, pneumoperitoneum or focal collection. Musculoskeletal: No acute or suspicious bony abnormalities are identified. Review of the MIP images confirms the above findings. IMPRESSION: 1. No acute abnormality. Unremarkable aorta - no evidence of thoracic or abdominal aortic dissection/aneurysm. No evidence of pulmonary emboli. 2. Markedly enlarged uterus likely representing multiple fibroid uterus. 3. Four LEFT LOWER lobe pulmonary nodules. Given that 2 of these nodules are calcified and benign, no follow-up needed if patient is low-risk (and has no known or suspected primary neoplasm). Non-contrast chest CT can be considered in 12 months if patient is high-risk. This recommendation follows the consensus statement: Guidelines for Management of Incidental Pulmonary Nodules Detected on CT Images: From the Fleischner Society 2017; Radiology 2017; 284:228-243. 4. Cardiomegaly. 5. Multinodular goiter. Electronically Signed   By: Margarette Canada M.D.   On: 12/13/2018 20:27    Chart has been reviewed    Assessment/Plan  48 y.o. female with medical history significant of HTN, anemia  Admitted for  hypertensive emergency in the setting of noncompliance with evidence of end organ damage such as elevated  troponin and AKI  Present on Admission:  Hypertensive emergency - restart home meds, admit to stepdown in case patient will require nitro drip as her blood pressure has been very labile so far Avoid over aggressive lowering would not go below 025 systolic tonight    Gastroesophageal reflux disease without esophagitis -stable   Chest pain in the setting of significantly elevated blood pressure continue to monitor on telemetry and continue to cycle cardiac enzymes   Elevated troponin - most likely secondary to hypertensive emergency will notify cardiology patient has been admitted check echogram    AKI (acute kidney injury) (Christmas)  -obtain urine electrolytes, improved blood pressure management continue to monitor   Goiter - she has hx of that in the past    Cardiomegaly - get an Echogram   Anemia -- Most likely cause of Anemia  heavy menses -Obtain anemia panel - Hemoccult stool 3 - Check TSH - If evidence of iron deficiency anemia or Hemoccult positive stools will need further GI workup    Hypokalemia - replace and check magnesium level   Other plan as per orders.  DVT prophylaxis:  SCD    Code Status:  FULL CODE   as per patient   I had personally discussed CODE STATUS with patient    Family Communication:   Family not at  Bedside   Disposition Plan:       To home once workup is complete and patient is stable      Consults called: email cardiology    Admission status:  ED Disposition    ED Disposition Condition [place]: [place] [[id number]]  Level of Care: Telemetry Cardiac [103]  I expect the patient will be discharged within 24 hours: No (not a candidate for 5C-Observation unit)  Covid Evaluation: Asymptomatic  Screening Protocol (No Symptoms)  Diagnosis: Hypertensive emergency [093818]  Admitting Physician: Toy Baker [[id number]]  Attending Physician: Toy Baker [[id number]]  PT Class (Do Not Modify):  Observation [104]  PT Acc Code (Do Not Modify): Observation [10022]          Obs    Level of care       Tele  Precautions:  Airborne until  COVIDnegative   PPE: Used by the provider:   P100  eye Goggles,  Gloves    Caira Poche 12/14/2018, 1:11 AM    [facility]     after 2 AM please page floor coverage PA If 7AM-7PM, please contact the day team taking care of the patient using [email or web address]

## 2018-12-13 NOTE — ED Provider Notes (Signed)
[name] Provider Note   CSN: [id number] Arrival date & time: 12/13/18  1511    History   Chief Complaint Chief Complaint  Patient presents with   Chest Pain   Hypertension    HPI [name] is a 48 y.o. female.     HPI  48 year old female presents with complaint of high blood pressure and chest pain.  She has been feeling overall poorly for a couple days, noticing that on 8/13 she had a mild headache that she associates with typical high blood pressure.  She has been out of her meds for a couple weeks.  Since yesterday has been feeling some shortness of breath as well as some left-sided neck pain that is worse with range of motion.  Today developed chest pain at work that sharp under her left breast but also pressure in the middle of her chest.  Feels like she needs to burp.  Chest pain is moderate, the neck pain is about a 5 or 6 out of 10.  The headache has remained constant and is about a 3 out of 10.  Similar to many prior headaches.  Chronic blurry vision but no new or worsening changes now.  She has no some intermittent back pain associated with this chest pain today.  Past Medical History:  Diagnosis Date   Blood transfusion without reported diagnosis    GERD (gastroesophageal reflux disease)    Hypertension    Multinodular goiter     Patient Active Problem List   Diagnosis Date Noted   Hypertensive emergency 12/13/2018   Elevated troponin 12/13/2018   AKI (acute kidney injury) ([place]) 12/13/2018   Goiter 12/13/2018   Anemia 12/13/2018   Multinodular goiter (nontoxic) 12/22/2015   Asthma 11/19/2015   Essential hypertension 08/29/2015   Gastroesophageal reflux disease without esophagitis 08/29/2015   Allergic rhinitis due to pollen 08/29/2015   Hepatitis B immune 02/24/2014   CHF exacerbation ([place]) 10/18/2013   Dyspnea 10/18/2013   Chest pain 10/18/2013   Cardiomegaly 08/23/2012   Fibroid,  uterine 03/17/2012   HYPERTENSION 02/02/2007    Past Surgical History:  Procedure Laterality Date   ENDOMETRIAL ABLATION       OB History   No obstetric history on file.      Home Medications    Prior to Admission medications   Medication Sig Start Date End Date Taking? Authorizing Provider  amLODipine (NORVASC) 10 MG tablet Take 1 tablet (10 mg total) by mouth daily. 05/10/18  Yes Julianne Rice, MD  aspirin EC 81 MG tablet Take 81 mg by mouth daily.   Yes [provider]  ibuprofen (ADVIL) 200 MG tablet Take 400 mg by mouth every 6 (six) hours as needed for headache or mild pain.   Yes [provider]  triamterene-hydrochlorothiazide (DYAZIDE) 37.5-25 MG capsule Take 1 each (1 capsule total) by mouth daily. 05/10/18  Yes Julianne Rice, MD  bisoprolol (ZEBETA) 10 MG tablet Take 1 tablet (10 mg total) by mouth daily. Patient not taking: Reported on 12/13/2018 07/10/16   Wardell Honour, MD    Family History Family History  Problem Relation Age of Onset   Hypertension Mother    Hypertension Father    Diabetes Father    Diabetes Sister    Hypertension Sister    Hypertension Brother    Hypertension Son    Cancer Paternal Aunt    Diabetes Maternal Grandmother    Hypertension Maternal Grandmother    Diabetes Paternal  Grandmother    Hypertension Paternal Grandmother    Diabetes Sister    Asthma Daughter     Social History Social History   Tobacco Use   Smoking status: Never Smoker   Smokeless tobacco: Never Used  Substance Use Topics   Alcohol use: Yes    Alcohol/week: 10.0 standard drinks    Types: 5 Cans of beer, 5 Shots of liquor per week    Comment: just on weekends   Drug use: No     Allergies   Patient has no known allergies.   Review of Systems Review of Systems  Constitutional: Negative for fever.  Respiratory: Positive for shortness of breath.   Cardiovascular: Positive for chest pain.  Gastrointestinal:  Negative for abdominal pain.  Musculoskeletal: Positive for back pain and neck pain.  Neurological: Positive for headaches.  All other systems reviewed and are negative.    Physical Exam Updated Vital Signs BP (!) 193/109    Pulse 61    Temp 98.9 F (37.2 C) (Oral)    Resp (!) 21    SpO2 99%   Physical Exam Vitals signs and nursing note reviewed.  Constitutional:      General: She is not in acute distress.    Appearance: She is well-developed. She is not ill-appearing or diaphoretic.  HENT:     Head: Normocephalic and atraumatic.     Right Ear: External ear normal.     Left Ear: External ear normal.     Nose: Nose normal.  Eyes:     General:        Right eye: No discharge.        Left eye: No discharge.     Extraocular Movements: Extraocular movements intact.     Pupils: Pupils are equal, round, and reactive to light.  Neck:     Musculoskeletal: Normal range of motion. No spinous process tenderness or muscular tenderness.     Comments: No tenderness or swelling to the neck. Cardiovascular:     Rate and Rhythm: Normal rate and regular rhythm.     Pulses:          Radial pulses are 2+ on the right side and 2+ on the left side.       Dorsalis pedis pulses are 2+ on the right side and 2+ on the left side.     Heart sounds: Normal heart sounds.  Pulmonary:     Effort: Pulmonary effort is normal.     Breath sounds: Normal breath sounds.  Abdominal:     Palpations: Abdomen is soft.     Tenderness: There is no abdominal tenderness.  Musculoskeletal:     Right lower leg: No edema.     Left lower leg: No edema.  Skin:    General: Skin is warm and dry.  Neurological:     Mental Status: She is alert.     Comments: CN 3-12 grossly intact. 5/5 strength in all 4 extremities. Grossly normal sensation. Normal finger to nose.   Psychiatric:        Mood and Affect: Mood is not anxious.      ED Treatments / Results  Labs (all labs ordered are listed, but only abnormal results  are displayed) Labs Reviewed  BASIC METABOLIC PANEL - Abnormal; Notable for the following components:      Result Value   Potassium 3.4 (*)    Creatinine, Ser 1.20 (*)    GFR calc non Af Amer 53 (*)  All other components within normal limits  CBC - Abnormal; Notable for the following components:   Hemoglobin 10.1 (*)    HCT 35.3 (*)    MCV 73.5 (*)    MCH 21.0 (*)    MCHC 28.6 (*)    RDW 18.9 (*)    All other components within normal limits  TROPONIN I (HIGH SENSITIVITY) - Abnormal; Notable for the following components:   Troponin I (High Sensitivity) 22 (*)    All other components within normal limits  TROPONIN I (HIGH SENSITIVITY) - Abnormal; Notable for the following components:   Troponin I (High Sensitivity) 24 (*)    All other components within normal limits  SARS CORONAVIRUS 2  MAGNESIUM  RAPID URINE DRUG SCREEN, HOSP PERFORMED  URINALYSIS, ROUTINE W REFLEX MICROSCOPIC  TSH  T4  T3, FREE  VITAMIN B12  FOLATE  IRON AND TIBC  FERRITIN  RETICULOCYTES  I-STAT BETA HCG BLOOD, ED ([facility], [facility], [facility] ONLY)    EKG EKG Interpretation  Date/Time:  Monday December 13 2018 15:24:16 EDT Ventricular Rate:  81 PR Interval:  152 QRS Duration: 98 QT Interval:  412 QTC Calculation: 478 R Axis:   12 Text Interpretation:  Normal sinus rhythm Possible Left atrial enlargement Left ventricular hypertrophy ST abnormality, possible digitalis effect Abnormal ECG ST changes similar to Jan 2020 Confirmed by Sherwood Gambler [phone]) on 12/13/2018 5:33:57 PM   Radiology Dg Chest 2 View  Result Date: 12/13/2018 CLINICAL DATA:  Chest pain EXAM: CHEST - 2 VIEW COMPARISON:  05/10/2018 FINDINGS: The heart size and mediastinal contours are within normal limits. Both lungs are clear. The visualized skeletal structures are unremarkable. IMPRESSION: No active cardiopulmonary disease. Electronically Signed   By: Davina Poke M.D.   On: 12/13/2018 16:01   Ct Angio Chest/abd/pel For Dissection W And/or  Wo Contrast  Result Date: 12/13/2018 CLINICAL DATA:  48 year old female with acute chest, abdominal and back pain. EXAM: CT ANGIOGRAPHY CHEST, ABDOMEN AND PELVIS TECHNIQUE: Multidetector CT imaging through the chest, abdomen and pelvis was performed using the standard protocol during bolus administration of intravenous contrast. Multiplanar reconstructed images and MIPs were obtained and reviewed to evaluate the vascular anatomy. CONTRAST:  175mL OMNIPAQUE IOHEXOL 350 MG/ML SOLN COMPARISON:  Multiple prior thyroid ultrasounds. 12/13/2018 and prior chest radiographs. FINDINGS: CTA CHEST FINDINGS Cardiovascular: Cardiomegaly noted with a LEFT chamber enlargement. There is no evidence of thoracic aortic aneurysm or dissection. No pulmonary emboli are identified. No pericardial effusion. Mediastinum/Nodes: Multinodular goiter again identified. No mediastinal mass or enlarged lymph nodes. No definite breast abnormalities identified. The visualized trachea and esophagus are unremarkable. Lungs/Pleura: 4 LEFT LOWER lobe pulmonary nodules are identified, 1 which is near completely calcified and 1 which is partially calcified. The largest nodule measures 5 mm (series 7: Image 87). The lungs are otherwise clear. There is no evidence of pleural effusion, pneumothorax, mass, airspace disease or consolidation. Musculoskeletal: No acute or suspicious bony abnormalities identified. Review of the MIP images confirms the above findings. CTA ABDOMEN AND PELVIS FINDINGS VASCULAR No vascular abnormalities are identified. There is no evidence of abdominal aortic aneurysm or dissection. No branch vessel abnormalities are noted. Review of the MIP images confirms the above findings. NON-VASCULAR Hepatobiliary: The liver and gallbladder are unremarkable. No biliary dilatation. Pancreas: Unremarkable Spleen: Unremarkable Adrenals/Urinary Tract: The kidneys, bladder and adrenal glands are unremarkable. Stomach/Bowel: Stomach is within  normal limits. Appendix appears normal. No evidence of bowel wall thickening, distention, or inflammatory changes. Lymphatic: No enlarged lymph nodes identified.  Reproductive: The uterus is markedly enlarged measuring 9 x 11.1 x 12 cm likely representing a fibroid uterus. No adnexal abnormalities are identified. Other: No ascites, pneumoperitoneum or focal collection. Musculoskeletal: No acute or suspicious bony abnormalities are identified. Review of the MIP images confirms the above findings. IMPRESSION: 1. No acute abnormality. Unremarkable aorta - no evidence of thoracic or abdominal aortic dissection/aneurysm. No evidence of pulmonary emboli. 2. Markedly enlarged uterus likely representing multiple fibroid uterus. 3. Four LEFT LOWER lobe pulmonary nodules. Given that 2 of these nodules are calcified and benign, no follow-up needed if patient is low-risk (and has no known or suspected primary neoplasm). Non-contrast chest CT can be considered in 12 months if patient is high-risk. This recommendation follows the consensus statement: Guidelines for Management of Incidental Pulmonary Nodules Detected on CT Images: From the Fleischner Society 2017; Radiology 2017; 284:228-243. 4. Cardiomegaly. 5. Multinodular goiter. Electronically Signed   By: Margarette Canada M.D.   On: 12/13/2018 20:27    Procedures .Critical Care Performed by: Sherwood Gambler, MD Authorized by: Sherwood Gambler, MD   Critical care provider statement:    Critical care time (minutes):  30   Critical care time was exclusive of:  Separately billable procedures and treating other patients   Critical care was necessary to treat or prevent imminent or life-threatening deterioration of the following conditions:  Cardiac failure   Critical care was time spent personally by me on the following activities:  Discussions with consultants, evaluation of patient's response to treatment, examination of patient, ordering and performing treatments and  interventions, ordering and review of laboratory studies, ordering and review of radiographic studies, pulse oximetry, re-evaluation of patient's condition, obtaining history from patient or surrogate and review of old charts   (including critical care time)  Medications Ordered in ED Medications  nitroGLYCERIN (NITROSTAT) SL tablet 0.4 mg (has no administration in time range)  potassium chloride SA (K-DUR) CR tablet 40 mEq (has no administration in time range)  amLODipine (NORVASC) tablet 10 mg (has no administration in time range)  hydrochlorothiazide (HYDRODIURIL) tablet 25 mg (has no administration in time range)  bisoprolol (ZEBETA) tablet 10 mg (has no administration in time range)  sodium chloride flush (NS) 0.9 % injection 3 mL (3 mLs Intravenous Given 12/13/18 2240)  HYDROmorphone (DILAUDID) injection 1 mg (1 mg Intravenous Given 12/13/18 1859)  aspirin chewable tablet 324 mg (324 mg Oral Given 12/13/18 1858)  iohexol (OMNIPAQUE) 350 MG/ML injection 100 mL (100 mLs Intravenous Contrast Given 12/13/18 1954)     Initial Impression / Assessment and Plan / ED Course  I have reviewed the triage vital signs and the nursing notes.  Pertinent labs & imaging results that were available during my care of the patient were reviewed by me and considered in my medical decision making (see chart for details).        Patient has significant hypertension though her chest pain is atypical.  Given her goes to the back or at least she has some back pain, CT obtained to rule out dissection which is negative.  Her blood pressure has slowly improved from maximum of 470 systolic.  She still has the atypical neck pain but no neuro symptoms.  At this point, with the troponins where they are, uncontrolled hypertension, and she will need admission for better blood pressure control.  Initial emergent blood pressure control seems better with the nitroglycerin and pain control so I will add on some of her oral home  meds.  Dr. Roel Cluck  to admit.  [name] was evaluated in Emergency Department on 12/13/2018 for the symptoms described in the history of present illness. She was evaluated in the context of the global COVID-19 pandemic, which necessitated consideration that the patient might be at risk for infection with the SARS-CoV-2 virus that causes COVID-19. Institutional protocols and algorithms that pertain to the evaluation of patients at risk for COVID-19 are in a state of rapid change based on information released by regulatory bodies including the CDC and federal and state organizations. These policies and algorithms were followed during the patient's care in the ED.   Final Clinical Impressions(s) / ED Diagnoses   Final diagnoses:  Hypertensive urgency  Atypical chest pain    ED Discharge Orders    None       Sherwood Gambler, MD 12/13/18 2253

## 2018-12-13 NOTE — ED Triage Notes (Signed)
Pt states she has been out of her BP medication for over 1 week and had her BP checked at work and was told it was in 200's. Pt was told by UC to come down to ER per pt. Pt states she has felt some pressure in her chest over the last few days- denies any cp at this time.

## 2018-12-13 NOTE — ED Notes (Signed)
Dr. Goldston notified of elevated troponin 

## 2018-12-14 ENCOUNTER — Ambulatory Visit (HOSPITAL_BASED_OUTPATIENT_CLINIC_OR_DEPARTMENT_OTHER): Payer: Managed Care, Other (non HMO)

## 2018-12-14 DIAGNOSIS — I161 Hypertensive emergency: Secondary | ICD-10-CM

## 2018-12-14 DIAGNOSIS — R0789 Other chest pain: Secondary | ICD-10-CM | POA: Diagnosis not present

## 2018-12-14 DIAGNOSIS — I160 Hypertensive urgency: Secondary | ICD-10-CM | POA: Diagnosis not present

## 2018-12-14 DIAGNOSIS — R7989 Other specified abnormal findings of blood chemistry: Secondary | ICD-10-CM | POA: Diagnosis not present

## 2018-12-14 LAB — LIPID PANEL
Cholesterol: 184 mg/dL (ref 0–200)
HDL: 51 mg/dL (ref >40)
LDL Cholesterol: 105 mg/dL — ABNORMAL HIGH (ref 0–99)
Total CHOL/HDL Ratio: 3.6 RATIO
Triglycerides: 141 mg/dL (ref <150)
VLDL: 28 mg/dL (ref 0–40)

## 2018-12-14 LAB — IRON AND TIBC
Iron: 24 ug/dL — ABNORMAL LOW (ref 28–170)
Saturation Ratios: 5 % — ABNORMAL LOW (ref 10.4–31.8)
TIBC: 528 ug/dL — ABNORMAL HIGH (ref 250–450)
UIBC: 504 ug/dL

## 2018-12-14 LAB — COMPREHENSIVE METABOLIC PANEL
ALT: 18 U/L (ref 0–44)
AST: 31 U/L (ref 15–41)
Albumin: 3.6 g/dL (ref 3.5–5.0)
Alkaline Phosphatase: 60 U/L (ref 38–126)
Anion gap: 12 (ref 5–15)
BUN: 14 mg/dL (ref 6–20)
CO2: 22 mmol/L (ref 22–32)
Calcium: 9.1 mg/dL (ref 8.9–10.3)
Chloride: 100 mmol/L (ref 98–111)
Creatinine, Ser: 0.75 mg/dL (ref 0.44–1.00)
GFR calc Af Amer: >60 mL/min (ref >60)
GFR calc non Af Amer: >60 mL/min (ref >60)
Glucose, Bld: 105 mg/dL — ABNORMAL HIGH (ref 70–99)
Potassium: 3.4 mmol/L — ABNORMAL LOW (ref 3.5–5.1)
Sodium: 134 mmol/L — ABNORMAL LOW (ref 135–145)
Total Bilirubin: 1.3 mg/dL — ABNORMAL HIGH (ref 0.3–1.2)
Total Protein: 7.5 g/dL (ref 6.5–8.1)

## 2018-12-14 LAB — RETICULOCYTES
Immature Retic Fract: 28.3 % — ABNORMAL HIGH (ref 2.3–15.9)
RBC.: 4.73 MIL/uL (ref 3.87–5.11)
Retic Count, Absolute: 96.5 10*3/uL (ref 19.0–186.0)
Retic Ct Pct: 2 % (ref 0.4–3.1)

## 2018-12-14 LAB — RAPID URINE DRUG SCREEN, HOSP PERFORMED
Amphetamines: NOT DETECTED
Barbiturates: NOT DETECTED
Benzodiazepines: NOT DETECTED
Cocaine: NOT DETECTED
Opiates: NOT DETECTED
Tetrahydrocannabinol: NOT DETECTED

## 2018-12-14 LAB — URINALYSIS, ROUTINE W REFLEX MICROSCOPIC
Bacteria, UA: NONE SEEN
Bilirubin Urine: NEGATIVE
Glucose, UA: NEGATIVE mg/dL
Ketones, ur: NEGATIVE mg/dL
Leukocytes,Ua: NEGATIVE
Nitrite: NEGATIVE
Protein, ur: 100 mg/dL — AB
RBC / HPF: >50 RBC/hpf — ABNORMAL HIGH (ref 0–5)
Specific Gravity, Urine: 1.044 — ABNORMAL HIGH (ref 1.005–1.030)
pH: 6 (ref 5.0–8.0)

## 2018-12-14 LAB — CBC
HCT: 34.7 % — ABNORMAL LOW (ref 36.0–46.0)
Hemoglobin: 10.2 g/dL — ABNORMAL LOW (ref 12.0–15.0)
MCH: 21.6 pg — ABNORMAL LOW (ref 26.0–34.0)
MCHC: 29.4 g/dL — ABNORMAL LOW (ref 30.0–36.0)
MCV: 73.4 fL — ABNORMAL LOW (ref 80.0–100.0)
Platelets: 270 10*3/uL (ref 150–400)
RBC: 4.73 MIL/uL (ref 3.87–5.11)
RDW: 19.1 % — ABNORMAL HIGH (ref 11.5–15.5)
WBC: 7.6 10*3/uL (ref 4.0–10.5)
nRBC: 0 % (ref 0.0–0.2)

## 2018-12-14 LAB — HIV ANTIBODY (ROUTINE TESTING W REFLEX): HIV Screen 4th Generation wRfx: NONREACTIVE

## 2018-12-14 LAB — PHOSPHORUS: Phosphorus: 4.3 mg/dL (ref 2.5–4.6)

## 2018-12-14 LAB — TSH: TSH: 1.779 u[IU]/mL (ref 0.350–4.500)

## 2018-12-14 LAB — SARS CORONAVIRUS 2 (TAT 6-24 HRS): SARS Coronavirus 2: NEGATIVE

## 2018-12-14 LAB — FOLATE: Folate: 12.8 ng/mL (ref >5.9)

## 2018-12-14 LAB — FERRITIN: Ferritin: 3 ng/mL — ABNORMAL LOW (ref 11–307)

## 2018-12-14 LAB — HEMOGLOBIN A1C
Hgb A1c MFr Bld: 5.3 % (ref 4.8–5.6)
Mean Plasma Glucose: 105.41 mg/dL

## 2018-12-14 LAB — TROPONIN I (HIGH SENSITIVITY)
Troponin I (High Sensitivity): 23 ng/L — ABNORMAL HIGH (ref <18)
Troponin I (High Sensitivity): 21 ng/L — ABNORMAL HIGH (ref <18)

## 2018-12-14 LAB — ECHOCARDIOGRAM COMPLETE

## 2018-12-14 LAB — MAGNESIUM: Magnesium: 1.8 mg/dL (ref 1.7–2.4)

## 2018-12-14 LAB — VITAMIN B12: Vitamin B-12: 394 pg/mL (ref 180–914)

## 2018-12-14 MED ORDER — ONDANSETRON HCL 4 MG/2ML IJ SOLN: 4.0000 mg | Freq: Four times a day (QID) | INTRAMUSCULAR | Status: DC | PRN

## 2018-12-14 MED ORDER — BLOOD PRESSURE KIT: 1.0000 | PACK | Freq: Every day | 0 refills | Status: AC

## 2018-12-14 MED ORDER — ONDANSETRON HCL 4 MG PO TABS: 4.0000 mg | ORAL_TABLET | Freq: Four times a day (QID) | ORAL | Status: DC | PRN

## 2018-12-14 MED ORDER — HYDRALAZINE HCL 20 MG/ML IJ SOLN
10.0000 mg | INTRAMUSCULAR | Status: DC | PRN
  Administered 2018-12-14: 10 mg via INTRAVENOUS
  Filled 2018-12-14: qty 1

## 2018-12-14 MED ORDER — TRIAMTERENE-HCTZ 37.5-25 MG PO TABS
1.0000 | ORAL_TABLET | Freq: Every day | ORAL | Status: DC
  Administered 2018-12-14: 1 via ORAL
  Filled 2018-12-14: qty 1

## 2018-12-14 MED ORDER — AMLODIPINE BESYLATE 5 MG PO TABS
10.0000 mg | ORAL_TABLET | Freq: Every day | ORAL | Status: DC
  Administered 2018-12-14: 10:00:00 10 mg via ORAL
  Filled 2018-12-14: qty 2

## 2018-12-14 MED ORDER — AMLODIPINE BESYLATE 10 MG PO TABS: 10.0000 mg | ORAL_TABLET | Freq: Every day | ORAL | 0 refills | Status: DC

## 2018-12-14 MED ORDER — HYDROCODONE-ACETAMINOPHEN 5-325 MG PO TABS: 1.0000 | ORAL_TABLET | ORAL | Status: DC | PRN

## 2018-12-14 MED ORDER — ASPIRIN EC 81 MG PO TBEC
81.0000 mg | DELAYED_RELEASE_TABLET | Freq: Every day | ORAL | Status: DC
  Administered 2018-12-14: 81 mg via ORAL
  Filled 2018-12-14: qty 1

## 2018-12-14 MED ORDER — TRIAMTERENE-HCTZ 37.5-25 MG PO CAPS
1.0000 | ORAL_CAPSULE | Freq: Every day | ORAL | Status: DC
  Filled 2018-12-14: qty 1

## 2018-12-14 MED ORDER — TRIAMTERENE-HCTZ 37.5-25 MG PO CAPS: 1.0000 | ORAL_CAPSULE | Freq: Every day | ORAL | 0 refills | Status: DC

## 2018-12-14 MED ORDER — SODIUM CHLORIDE 0.9% FLUSH: 3.0000 mL | INTRAVENOUS | Status: DC | PRN

## 2018-12-14 MED ORDER — SODIUM CHLORIDE 0.9% FLUSH: 3.0000 mL | Freq: Two times a day (BID) | INTRAVENOUS | Status: DC

## 2018-12-14 MED ORDER — ACETAMINOPHEN 325 MG PO TABS
650.0000 mg | ORAL_TABLET | Freq: Four times a day (QID) | ORAL | Status: DC | PRN
  Administered 2018-12-14: 650 mg via ORAL
  Filled 2018-12-14: qty 2

## 2018-12-14 MED ORDER — ACETAMINOPHEN 650 MG RE SUPP: 650.0000 mg | Freq: Four times a day (QID) | RECTAL | Status: DC | PRN

## 2018-12-14 MED ORDER — SODIUM CHLORIDE 0.9 % IV SOLN: 250.0000 mL | INTRAVENOUS | Status: DC | PRN

## 2018-12-14 NOTE — Discharge Summary (Addendum)
Physician Discharge Summary  [name] FVC:[id number] DOB: [birth year] DOA: 12/13/2018  PCP: Hayden Rasmussen, MD  Admit date: 12/13/2018 Discharge date: 12/14/2018  Admitted From: Home Disposition: Home  Recommendations for Outpatient Follow-up:  1. Follow up with PCP in 1-2 weeks 2. Please obtain BMP/CBC in one week  Discharge Condition: Stable CODE STATUS: Full Diet recommendation: Cardiac prudent low-salt diet  Brief/Interim Summary: [name] is a 48 y.o. female with medical history significant of HTN, anemia who presented with chest pain. Been out of blood pressure medications for the past 1 week at work her blood pressure was above 200. And she was told by urgent care to go down to emergency department she has been feeling some pressure in her chest for the past few days also some associated back pain and neck pain. Reports Neck pain started this weekend but she have had that before  It has been persistent for few days No fever no chills. Been having some associated headache which usually happens when her blood pressure goes up.  Her left side and neck has been hurting when she tries to move it.  She is unsure about the etiology of this.  Describes chest pain as sharp under her left breast and feels like pressure like she needs to burp.  She has been having history of atypical chest pains in the past. Having some blurry vision but that is chronic and has not changed. She has known history of anemia- hx of fibroids with heavy periods  She has been feeling lightheaded lately Denies tobacco Occasional EtOh She has been taking aspirin and ibuprofen for her headache  Patient admitted as above for atypical chest pain likely in the setting of hypertensive emergency.  Patient has been markedly noncompliant with home medications stating that her amlodipine "makes me tired" and has not been seen in the outpatient setting by any continuous care, patient does occasionally see urgent care when  her blood pressure is up -which is almost always symptomatic with headaches, neck pain and questionably vision changes.  Patient had minimally elevated troponin at intake essentially flat at 22, 24, 23.  EKGs were without ST elevations, CTA unremarkable the overt PE, incidentally noted pulmonary nodules will need to be followed in the outpatient setting by PCP. Elevated creatinine resolved (again likely 2/2 hypertensive emergency). Patient has established care with new PCP to follow this Thursday, has scheduled appointment to follow-up with routine ongoing PCP office discussed was a marked improvement from her splintered health care prior with urgent cares.  At this time patient's blood pressure has remarkably improved, admitted with blood pressure in the 230/130 range now at 150/90.  Patient has been placed back on her home amlodipine as well as triamterene HCTZ with no ongoing symptomatology patient is otherwise stable and agreeable for discharge.  Discharge Diagnoses:  Active Problems:   Cardiomegaly   Chest pain   Gastroesophageal reflux disease without esophagitis   Asthma   Hypertensive emergency   Elevated troponin   AKI (acute kidney injury) ([place])   Goiter   Anemia   Hypokalemia  Discharge Instructions  Allergies as of 12/14/2018   No Known Allergies     Medication List    TAKE these medications   amLODipine 10 MG tablet Commonly known as: NORVASC Take 1 tablet (10 mg total) by mouth daily.   aspirin EC 81 MG tablet Take 81 mg by mouth daily.   triamterene-hydrochlorothiazide 37.5-25 MG capsule Commonly known as: DYAZIDE Take 1 each (  1 capsule total) by mouth daily.       No Known Allergies  Consultations:  Cardiology  Procedures/Studies: Dg Chest 2 View  Result Date: 12/13/2018 CLINICAL DATA:  Chest pain EXAM: CHEST - 2 VIEW COMPARISON:  05/10/2018 FINDINGS: The heart size and mediastinal contours are within normal limits. Both lungs are clear. The visualized  skeletal structures are unremarkable. IMPRESSION: No active cardiopulmonary disease. Electronically Signed   By: Davina Poke M.D.   On: 12/13/2018 16:01   Ct Angio Chest/abd/pel For Dissection W And/or Wo Contrast  Result Date: 12/13/2018 CLINICAL DATA:  48 year old female with acute chest, abdominal and back pain. EXAM: CT ANGIOGRAPHY CHEST, ABDOMEN AND PELVIS TECHNIQUE: Multidetector CT imaging through the chest, abdomen and pelvis was performed using the standard protocol during bolus administration of intravenous contrast. Multiplanar reconstructed images and MIPs were obtained and reviewed to evaluate the vascular anatomy. CONTRAST:  166mL OMNIPAQUE IOHEXOL 350 MG/ML SOLN COMPARISON:  Multiple prior thyroid ultrasounds. 12/13/2018 and prior chest radiographs. FINDINGS: CTA CHEST FINDINGS Cardiovascular: Cardiomegaly noted with a LEFT chamber enlargement. There is no evidence of thoracic aortic aneurysm or dissection. No pulmonary emboli are identified. No pericardial effusion. Mediastinum/Nodes: Multinodular goiter again identified. No mediastinal mass or enlarged lymph nodes. No definite breast abnormalities identified. The visualized trachea and esophagus are unremarkable. Lungs/Pleura: 4 LEFT LOWER lobe pulmonary nodules are identified, 1 which is near completely calcified and 1 which is partially calcified. The largest nodule measures 5 mm (series 7: Image 87). The lungs are otherwise clear. There is no evidence of pleural effusion, pneumothorax, mass, airspace disease or consolidation. Musculoskeletal: No acute or suspicious bony abnormalities identified. Review of the MIP images confirms the above findings. CTA ABDOMEN AND PELVIS FINDINGS VASCULAR No vascular abnormalities are identified. There is no evidence of abdominal aortic aneurysm or dissection. No branch vessel abnormalities are noted. Review of the MIP images confirms the above findings. NON-VASCULAR Hepatobiliary: The liver and  gallbladder are unremarkable. No biliary dilatation. Pancreas: Unremarkable Spleen: Unremarkable Adrenals/Urinary Tract: The kidneys, bladder and adrenal glands are unremarkable. Stomach/Bowel: Stomach is within normal limits. Appendix appears normal. No evidence of bowel wall thickening, distention, or inflammatory changes. Lymphatic: No enlarged lymph nodes identified. Reproductive: The uterus is markedly enlarged measuring 9 x 11.1 x 12 cm likely representing a fibroid uterus. No adnexal abnormalities are identified. Other: No ascites, pneumoperitoneum or focal collection. Musculoskeletal: No acute or suspicious bony abnormalities are identified. Review of the MIP images confirms the above findings. IMPRESSION: 1. No acute abnormality. Unremarkable aorta - no evidence of thoracic or abdominal aortic dissection/aneurysm. No evidence of pulmonary emboli. 2. Markedly enlarged uterus likely representing multiple fibroid uterus. 3. Four LEFT LOWER lobe pulmonary nodules. Given that 2 of these nodules are calcified and benign, no follow-up needed if patient is low-risk (and has no known or suspected primary neoplasm). Non-contrast chest CT can be considered in 12 months if patient is high-risk. This recommendation follows the consensus statement: Guidelines for Management of Incidental Pulmonary Nodules Detected on CT Images: From the Fleischner Society 2017; Radiology 2017; 284:228-243. 4. Cardiomegaly. 5. Multinodular goiter. Electronically Signed   By: Margarette Canada M.D.   On: 12/13/2018 20:27    Subjective: Denies chest pain, headache, vision changes, nausea, vomiting, diarrhea, constipation, fevers, or chills this morning.  Discharge Exam: Vitals:   12/14/18 0830 12/14/18 0900  BP:  (!) 142/85  Pulse: (!) 51   Resp: 17 15  Temp:    SpO2: 100%    Vitals:  12/14/18 0736 12/14/18 0800 12/14/18 0830 12/14/18 0900  BP: (!) 156/98 (!) 153/100  (!) 142/85  Pulse: 65 (!) 56 (!) 51   Resp: 15 16 17 15    Temp:      TempSrc:      SpO2: 99% 99% 100%     General:  Pleasantly resting in bed, No acute distress. HEENT:  Normocephalic atraumatic.  Sclerae nonicteric, noninjected.  Extraocular movements intact bilaterally. Neck:  Without mass or deformity.  Trachea is midline. Lungs:  Clear to auscultate bilaterally without rhonchi, wheeze, or rales. Heart:  Regular rate and rhythm.  Without murmurs, rubs, or gallops. Abdomen:  Soft, nontender, nondistended.  Without guarding or rebound. Extremities: Without cyanosis, clubbing, edema, or obvious deformity. Vascular:  Dorsalis pedis and posterior tibial pulses palpable bilaterally. Skin:  Warm and dry, no erythema, no ulcerations.   The results of significant diagnostics from this hospitalization (including imaging, microbiology, ancillary and laboratory) are listed below for reference.     Microbiology: Recent Results (from the past 240 hour(s))  SARS CORONAVIRUS 2 Nasal Swab Aptima Multi Swab     Status: None   Collection Time: 12/13/18  9:52 PM   Specimen: Aptima Multi Swab; Nasal Swab  Result Value Ref Range Status   SARS Coronavirus 2 NEGATIVE NEGATIVE Final    Comment: (NOTE) SARS-CoV-2 target nucleic acids are NOT DETECTED. The SARS-CoV-2 RNA is generally detectable in upper and lower respiratory specimens during the acute phase of infection. Negative results do not preclude SARS-CoV-2 infection, do not rule out co-infections with other pathogens, and should not be used as the sole basis for treatment or other patient management decisions. Negative results must be combined with clinical observations, patient history, and epidemiological information. The expected result is Negative. Fact Sheet for Patients: SugarRoll.be Fact Sheet for Healthcare Providers: https://www.woods-mathews.com/ This test is not yet approved or cleared by the [place] FDA and  has been authorized for  detection and/or diagnosis of SARS-CoV-2 by FDA under an Emergency Use Authorization (EUA). This EUA will remain  in effect (meaning this test can be used) for the duration of the COVID-19 declaration under Section 56 4(b)(1) of the Act, 21 U.S.C. section 360bbb-3(b)(1), unless the authorization is terminated or revoked sooner. Performed at [facility], [place] [address] [place], [place] [address]      Labs: BNP (last 3 results) No results for input(s): BNP in the last 8760 hours. Basic Metabolic Panel: Recent Labs  Lab 12/13/18 1527 12/13/18 1826 12/14/18 0742  NA 135  --  134*  K 3.4*  --  3.4*  CL 98  --  100  CO2 23  --  22  GLUCOSE 97  --  105*  BUN 14  --  14  CREATININE 1.20*  --  0.75  CALCIUM 9.4  --  9.1  MG  --  1.8 1.8  PHOS  --   --  4.3   Liver Function Tests: Recent Labs  Lab 12/14/18 0742  AST 31  ALT 18  ALKPHOS 60  BILITOT 1.3*  PROT 7.5  ALBUMIN 3.6   CBC: Recent Labs  Lab 12/13/18 1527 12/14/18 0742  WBC 10.5 7.6  HGB 10.1* 10.2*  HCT 35.3* 34.7*  MCV 73.5* 73.4*  PLT 294 270   Cardiac Enzymes: Troponin 22, 24, 23, 21  Hgb A1c Recent Labs    12/14/18 0742  HGBA1C 5.3   Lipid Profile Recent Labs    12/14/18 0742  CHOL 184  HDL 51  LDLCALC 105*  TRIG 141  CHOLHDL 3.6   Thyroid function studies Recent Labs    12/14/18 0742  TSH 1.779   Anemia work up Recent Labs    12/14/18 0742  VITAMINB12 394  FOLATE 12.8  FERRITIN 3*  TIBC 528*  IRON 24*  RETICCTPCT 2.0   Urinalysis    Component Value Date/Time   COLORURINE RED (A) 12/13/2018 2346   APPEARANCEUR HAZY (A) 12/13/2018 2346   LABSPEC 1.044 (H) 12/13/2018 2346   PHURINE 6.0 12/13/2018 2346   GLUCOSEU NEGATIVE 12/13/2018 2346   HGBUR LARGE (A) 12/13/2018 2346   BILIRUBINUR NEGATIVE 12/13/2018 2346   BILIRUBINUR negative 07/10/2016 1213   BILIRUBINUR neg 07/29/2012 0937   KETONESUR NEGATIVE 12/13/2018 2346   PROTEINUR 100 (A) 12/13/2018 2346    UROBILINOGEN 0.2 07/10/2016 1213   UROBILINOGEN 0.2 07/09/2010 1842   NITRITE NEGATIVE 12/13/2018 2346   LEUKOCYTESUR NEGATIVE 12/13/2018 2346   Microbiology Recent Results (from the past 240 hour(s))  SARS CORONAVIRUS 2 Nasal Swab Aptima Multi Swab     Status: None   Collection Time: 12/13/18  9:52 PM   Specimen: Aptima Multi Swab; Nasal Swab  Result Value Ref Range Status   SARS Coronavirus 2 NEGATIVE NEGATIVE Final    Comment: (NOTE) SARS-CoV-2 target nucleic acids are NOT DETECTED. The SARS-CoV-2 RNA is generally detectable in upper and lower respiratory specimens during the acute phase of infection. Negative results do not preclude SARS-CoV-2 infection, do not rule out co-infections with other pathogens, and should not be used as the sole basis for treatment or other patient management decisions. Negative results must be combined with clinical observations, patient history, and epidemiological information. The expected result is Negative. Fact Sheet for Patients: SugarRoll.be Fact Sheet for Healthcare Providers: https://www.woods-mathews.com/ This test is not yet approved or cleared by the [place] FDA and  has been authorized for detection and/or diagnosis of SARS-CoV-2 by FDA under an Emergency Use Authorization (EUA). This EUA will remain  in effect (meaning this test can be used) for the duration of the COVID-19 declaration under Section 56 4(b)(1) of the Act, 21 U.S.C. section 360bbb-3(b)(1), unless the authorization is terminated or revoked sooner. Performed at [facility], [place] [address] [place], [place] [address]      Time coordinating discharge: Over 30 minutes  SIGNED:   Little Ishikawa, DO [facility] 12/14/2018, 10:42 AM Pager   If 7PM-7AM, please contact night-coverage [email or web address] Password [email or web address]

## 2018-12-14 NOTE — Progress Notes (Signed)
  Echocardiogram 2D Echocardiogram has been performed.  Marybelle Killings 12/14/2018, 11:00 AM

## 2018-12-14 NOTE — ED Notes (Signed)
AVS provided to patient.  Discharge education including blood pressure checks provided.  Patient understands she needs to go to pharmacy to pick up medications  No further questions at this time.  Patient ambulated to lobby with family without difficulty.

## 2018-12-14 NOTE — ED Notes (Signed)
SDU Ordered bfast 

## 2018-12-15 LAB — T4: T4, Total: 7.5 ug/dL (ref 4.5–12.0)

## 2018-12-15 LAB — T3, FREE: T3, Free: 3.6 pg/mL (ref 2.0–4.4)

## 2019-03-04 ENCOUNTER — Emergency Department (HOSPITAL_COMMUNITY): Payer: Self-pay

## 2019-03-04 ENCOUNTER — Emergency Department (HOSPITAL_COMMUNITY)
Admission: EM | Admit: 2019-03-04 | Discharge: 2019-03-05 | Disposition: A | Discharge status: Home / Self Care | Payer: Self-pay | Attending: Emergency Medicine | Admitting: Emergency Medicine

## 2019-03-04 ENCOUNTER — Encounter (HOSPITAL_COMMUNITY): Payer: Self-pay | Admitting: Emergency Medicine

## 2019-03-04 ENCOUNTER — Other Ambulatory Visit: Payer: Self-pay

## 2019-03-04 DIAGNOSIS — Z79899 Other long term (current) drug therapy: Secondary | ICD-10-CM | POA: Insufficient documentation

## 2019-03-04 DIAGNOSIS — I110 Hypertensive heart disease with heart failure: Secondary | ICD-10-CM | POA: Insufficient documentation

## 2019-03-04 DIAGNOSIS — R0602 Shortness of breath: Secondary | ICD-10-CM

## 2019-03-04 DIAGNOSIS — R0789 Other chest pain: Secondary | ICD-10-CM | POA: Insufficient documentation

## 2019-03-04 DIAGNOSIS — Z7982 Long term (current) use of aspirin: Secondary | ICD-10-CM | POA: Insufficient documentation

## 2019-03-04 DIAGNOSIS — I509 Heart failure, unspecified: Secondary | ICD-10-CM | POA: Insufficient documentation

## 2019-03-04 LAB — CBC
HCT: 33.2 % — ABNORMAL LOW (ref 36.0–46.0)
Hemoglobin: 10.4 g/dL — ABNORMAL LOW (ref 12.0–15.0)
MCH: 24.8 pg — ABNORMAL LOW (ref 26.0–34.0)
MCHC: 31.3 g/dL (ref 30.0–36.0)
MCV: 79.2 fL — ABNORMAL LOW (ref 80.0–100.0)
Platelets: 251 10*3/uL (ref 150–400)
RBC: 4.19 MIL/uL (ref 3.87–5.11)
RDW: 20.5 % — ABNORMAL HIGH (ref 11.5–15.5)
WBC: 5 10*3/uL (ref 4.0–10.5)
nRBC: 0 % (ref 0.0–0.2)

## 2019-03-04 LAB — BASIC METABOLIC PANEL
Anion gap: 12 (ref 5–15)
BUN: 13 mg/dL (ref 6–20)
CO2: 23 mmol/L (ref 22–32)
Calcium: 8.9 mg/dL (ref 8.9–10.3)
Chloride: 106 mmol/L (ref 98–111)
Creatinine, Ser: 0.79 mg/dL (ref 0.44–1.00)
GFR calc Af Amer: >60 mL/min (ref >60)
GFR calc non Af Amer: >60 mL/min (ref >60)
Glucose, Bld: 94 mg/dL (ref 70–99)
Potassium: 3.3 mmol/L — ABNORMAL LOW (ref 3.5–5.1)
Sodium: 141 mmol/L (ref 135–145)

## 2019-03-04 LAB — I-STAT BETA HCG BLOOD, ED (MC, WL, AP ONLY): I-stat hCG, quantitative: <5 m[IU]/mL (ref <5)

## 2019-03-04 NOTE — ED Triage Notes (Signed)
Pt reports sob that has gotten worse since yesterday. Pt also reports non-productive dry cough and runny nose. Denies any pain.

## 2019-03-05 LAB — TROPONIN I (HIGH SENSITIVITY): Troponin I (High Sensitivity): 16 ng/L (ref <18)

## 2019-03-05 NOTE — ED Provider Notes (Signed)
[facility] EMERGENCY DEPARTMENT Provider Note  CSN: [id number] Arrival date & time: 03/04/19 1549  Chief Complaint(s) Shortness of Breath  HPI [name] is a 48 y.o. female with a history of hypertension who presents to the emergency department with 2 days of intermittent sporadic right-sided chest discomfort.  Pain is nonradiating and nonexertional.  No alleviating or aggravating factors.  Similar pain in the past.  Has not had pain for over 2 to 3 hours.  Denies any associated shortness of breath.  Did report worsening shortness of breath over the past several weeks with prolonged exertion.  Endorses dry cough.  Has a history of peripheral edema but appears to be improving after new blood pressure medicine.  No recent fevers or infections.  No abdominal pain.  No nausea or vomiting.  No other physical complaints.  HPI  Past Medical History Past Medical History:  Diagnosis Date  . Blood transfusion without reported diagnosis   . GERD (gastroesophageal reflux disease)   . Hypertension   . Multinodular goiter    Patient Active Problem List   Diagnosis Date Noted  . Hypertensive emergency 12/13/2018  . Elevated troponin 12/13/2018  . AKI (acute kidney injury) ([place]) 12/13/2018  . Goiter 12/13/2018  . Anemia 12/13/2018  . Hypokalemia 12/13/2018  . Multinodular goiter (nontoxic) 12/22/2015  . Asthma 11/19/2015  . Essential hypertension 08/29/2015  . Gastroesophageal reflux disease without esophagitis 08/29/2015  . Allergic rhinitis due to pollen 08/29/2015  . Hepatitis B immune 02/24/2014  . CHF exacerbation ([place]) 10/18/2013  . Dyspnea 10/18/2013  . Chest pain 10/18/2013  . Cardiomegaly 08/23/2012  . Fibroid, uterine 03/17/2012  . HYPERTENSION 02/02/2007   Home Medication(s) Prior to Admission medications   Medication Sig Start Date End Date Taking? Authorizing Provider  amLODipine (NORVASC) 10 MG tablet Take 1 tablet (10 mg total) by mouth daily.  12/14/18   Little Ishikawa, MD  aspirin EC 81 MG tablet Take 81 mg by mouth daily.    [provider]  Blood Pressure KIT 1 each by Does not apply route daily after breakfast. 12/14/18   Little Ishikawa, MD  triamterene-hydrochlorothiazide (DYAZIDE) 37.5-25 MG capsule Take 1 each (1 capsule total) by mouth daily. 12/14/18   Little Ishikawa, MD                                                                                                                                    Past Surgical History Past Surgical History:  Procedure Laterality Date  . ENDOMETRIAL ABLATION     Family History Family History  Problem Relation Age of Onset  . Hypertension Mother   . Hypertension Father   . Diabetes Father   . Diabetes Sister   . Hypertension Sister   . Hypertension Brother   . Hypertension Son   . Cancer Paternal Aunt   . Diabetes Maternal Grandmother   . Hypertension Maternal  Grandmother   . Diabetes Paternal Grandmother   . Hypertension Paternal Grandmother   . Diabetes Sister   . Asthma Daughter     Social History Social History   Tobacco Use  . Smoking status: Never Smoker  . Smokeless tobacco: Never Used  Substance Use Topics  . Alcohol use: Yes    Alcohol/week: 10.0 standard drinks    Types: 5 Cans of beer, 5 Shots of liquor per week    Comment: just on weekends  . Drug use: No   Allergies Patient has no known allergies.  Review of Systems Review of Systems All other systems are reviewed and are negative for acute change except as noted in the HPI  Physical Exam Vital Signs  I have reviewed the triage vital signs BP (!) 174/105   Pulse 93   Temp 98.2 F (36.8 C) (Oral)   Resp 18   Ht 5' 2"  (1.575 m)   Wt 84.8 kg   LMP 12/04/2018   SpO2 98%   BMI 34.20 kg/m   Physical Exam Vitals signs reviewed.  Constitutional:      General: She is not in acute distress.    Appearance: She is well-developed. She is not diaphoretic.  HENT:      Head: Normocephalic and atraumatic.     Nose: Nose normal.  Eyes:     General: No scleral icterus.       Right eye: No discharge.        Left eye: No discharge.     Conjunctiva/sclera: Conjunctivae normal.     Pupils: Pupils are equal, round, and reactive to light.  Neck:     Musculoskeletal: Normal range of motion and neck supple.  Cardiovascular:     Rate and Rhythm: Normal rate and regular rhythm.     Heart sounds: No murmur. No friction rub. No gallop.   Pulmonary:     Effort: Pulmonary effort is normal. No respiratory distress.     Breath sounds: Normal breath sounds. No stridor. No rales.  Abdominal:     General: There is no distension.     Palpations: Abdomen is soft.     Tenderness: There is no abdominal tenderness.  Musculoskeletal:        General: No tenderness.     Right lower leg: 1+ Pitting Edema present.     Left lower leg: 1+ Pitting Edema present.  Skin:    General: Skin is warm and dry.     Findings: No erythema or rash.  Neurological:     Mental Status: She is alert and oriented to person, place, and time.     ED Results and Treatments Labs (all labs ordered are listed, but only abnormal results are displayed) Labs Reviewed  CBC - Abnormal; Notable for the following components:      Result Value   Hemoglobin 10.4 (*)    HCT 33.2 (*)    MCV 79.2 (*)    MCH 24.8 (*)    RDW 20.5 (*)    All other components within normal limits  BASIC METABOLIC PANEL - Abnormal; Notable for the following components:   Potassium 3.3 (*)    All other components within normal limits  I-STAT BETA HCG BLOOD, ED ([facility], [facility], [facility] ONLY)  TROPONIN I (HIGH SENSITIVITY)  EKG  EKG Interpretation  Date/Time:  Friday March 04 2019 16:22:54 EST Ventricular Rate:  88 PR Interval:  164 QRS Duration: 94 QT Interval:  362 QTC Calculation: 438 R Axis:   19 Text  Interpretation: Normal sinus rhythm Normal ECG No significant change since last tracing Confirmed by Addison Lank [phone]) on 03/04/2019 11:51:53 PM      Radiology Dg Chest 2 View  Result Date: 03/04/2019 CLINICAL DATA:  Shortness of breath EXAM: CHEST - 2 VIEW COMPARISON:  December 13, 2018 FINDINGS: The lungs are clear. The heart size and pulmonary vascularity are normal. No adenopathy. No bone lesions. IMPRESSION: No edema or consolidation. Electronically Signed   By: Lowella Grip III M.D.   On: 03/04/2019 17:21    Pertinent labs & imaging results that were available during my care of the patient were reviewed by me and considered in my medical decision making (see chart for details).  Medications Ordered in ED Medications - No data to display                                                                                                                                  Procedures Procedures  (including critical care time)  Medical Decision Making / ED Course I have reviewed the nursing notes for this encounter and the patient's prior records (if available in EHR or on provided paperwork).   KEHINDE TOTZKE was evaluated in Emergency Department on 03/05/2019 for the symptoms described in the history of present illness. She was evaluated in the context of the global COVID-19 pandemic, which necessitated consideration that the patient might be at risk for infection with the SARS-CoV-2 virus that causes COVID-19. Institutional protocols and algorithms that pertain to the evaluation of patients at risk for COVID-19 are in a state of rapid change based on information released by regulatory bodies including the CDC and federal and state organizations. These policies and algorithms were followed during the patient's care in the ED.  Highly atypical chest pain, inconsistent with ACS.  EKG without acute ischemic changes or evidence of pericarditis.  Believe single troponin should be sufficient  given timeframe.  Trop negative.  Low suspicion for pulmonary embolism.  Not classic for aortic dissection or esophageal perforation.  Chest x-ray without evidence suggestive of pneumonia, pneumothorax, pneumomediastinum.  No abnormal contour of the mediastinum to suggest dissection. No evidence of acute injuries.   The patient appears reasonably screened and/or stabilized for discharge and I doubt any other medical condition or other [facility] requiring further screening, evaluation, or treatment in the ED at this time prior to discharge.  The patient is safe for discharge with strict return precautions.       Final Clinical Impression(s) / ED Diagnoses Final diagnoses:  Atypical chest pain  SOB (shortness of breath)    The patient appears reasonably screened and/or stabilized for discharge and I doubt any other medical condition or other [facility]  requiring further screening, evaluation, or treatment in the ED at this time prior to discharge.  Disposition: Discharge  Condition: Good  I have discussed the results, Dx and Tx plan with the patient who expressed understanding and agree(s) with the plan. Discharge instructions discussed at great length. The patient was given strict return precautions who verbalized understanding of the instructions. No further questions at time of discharge.    ED Discharge Orders    None      Follow Up: Hayden Rasmussen, MD [place] [address] [phone]  Schedule an appointment as soon as possible for a visit        This chart was dictated using voice recognition software.  Despite best efforts to proofread,  errors can occur which can change the documentation meaning.   Fatima Blank, MD 03/05/19 [phone]

## 2019-09-05 ENCOUNTER — Other Ambulatory Visit: Payer: Self-pay

## 2019-09-05 ENCOUNTER — Ambulatory Visit: Payer: Self-pay

## 2019-09-05 ENCOUNTER — Ambulatory Visit: Payer: Self-pay | Attending: Family

## 2019-11-17 IMAGING — CT CT ANGIO CHEST-ABD-PELV FOR DISSECTION W/ AND WO/W CM
2 of 7 series · 15 of 46 positions shown, 17 images · IV contrast (APPLIED)
Comparison: Multiple prior thyroid ultrasounds. 12/13/2018 and
prior chest radiographs.

CLINICAL DATA: 48-year-old female with acute chest, abdominal and
back pain.

EXAM:
CT ANGIOGRAPHY CHEST, ABDOMEN AND PELVIS
TECHNIQUE: Multidetector CT imaging through the chest, abdomen and pelvis was
performed using the standard protocol during bolus administration of
intravenous contrast. Multiplanar reconstructed images and MIPs were
obtained and reviewed to evaluate the vascular anatomy.
CONTRAST:  100mL OMNIPAQUE IOHEXOL 350 MG/ML SOLN

[Series 6: arterial · axial · arterial · 0.89mm/px · z∈[+904,+1436]mm · 12 of 300 slices shown, 14 images]
[im 17/300  soft-tissue]
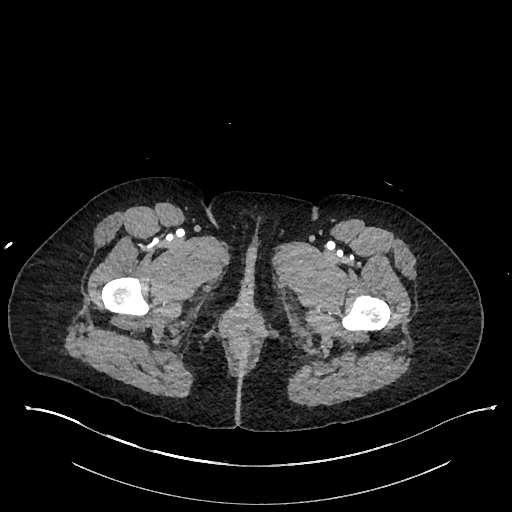
[im 17/300  bone]
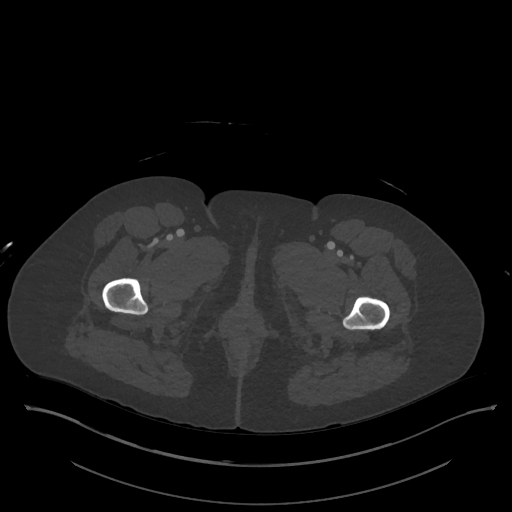
[im 50/300  soft-tissue]
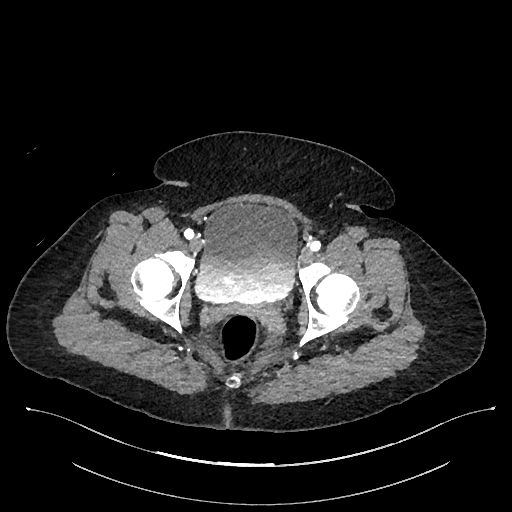
[im 67/300  soft-tissue]
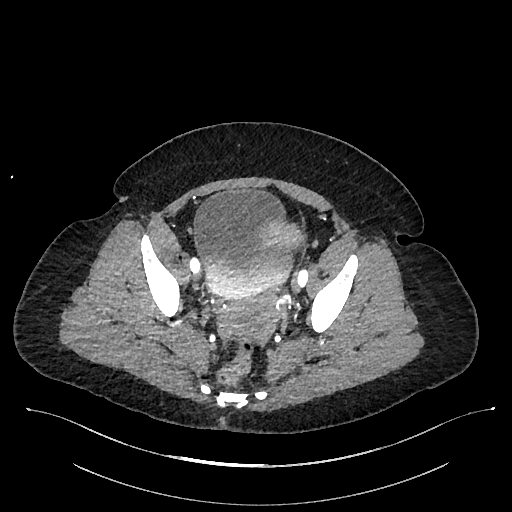
[im 84/300  soft-tissue]
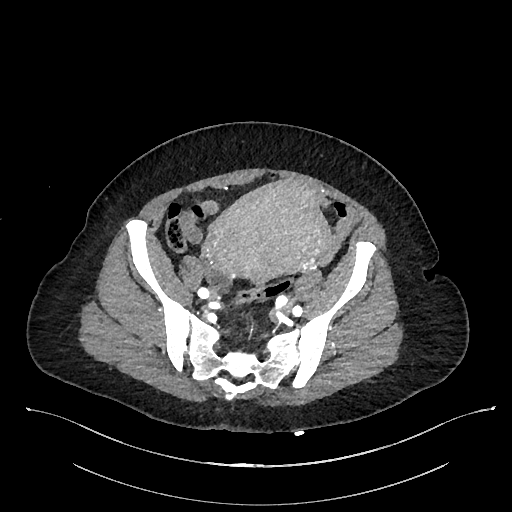
[im 117/300  soft-tissue]
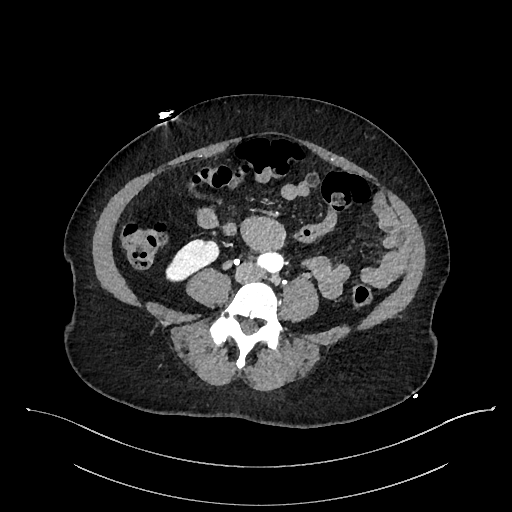
[im 133/300  soft-tissue]
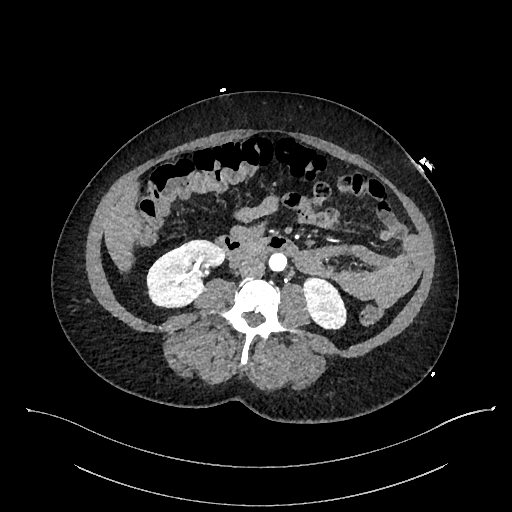
[im 167/300  soft-tissue]
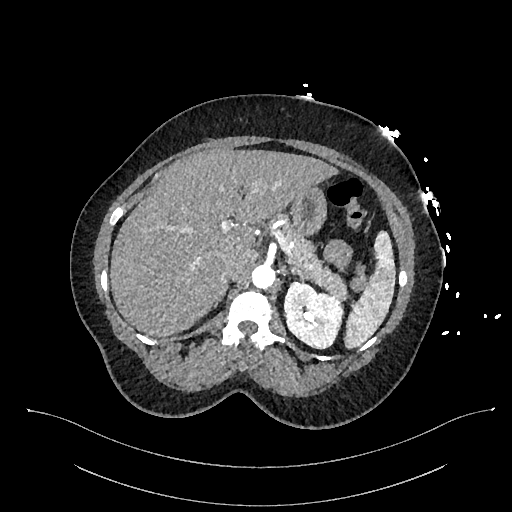
[im 183/300  soft-tissue]
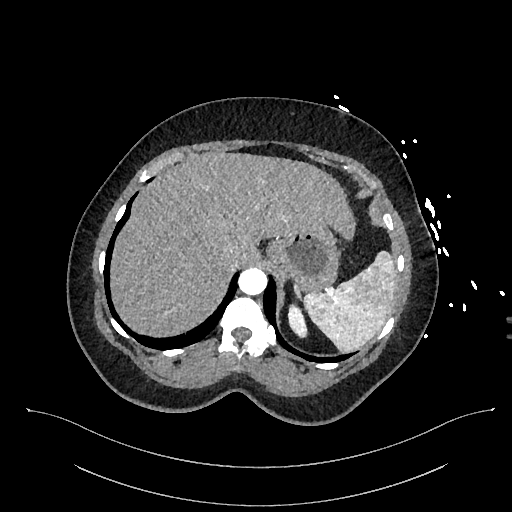
[im 216/300  soft-tissue]
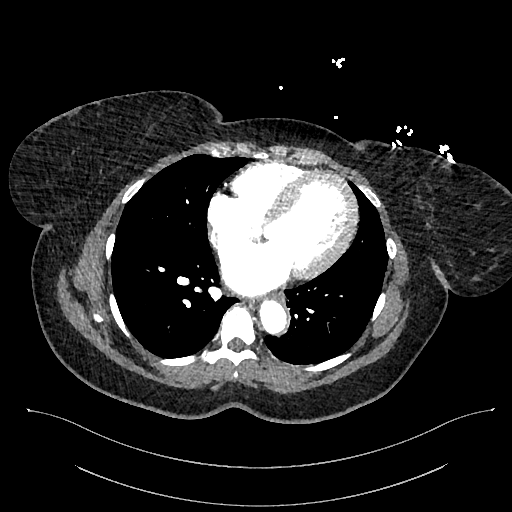
[im 216/300  bone]
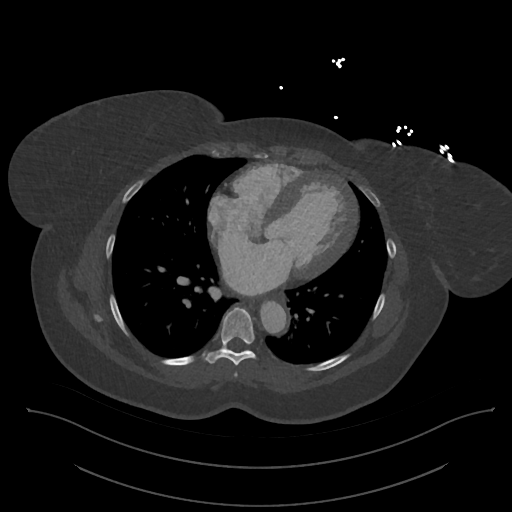
[im 233/300  soft-tissue]
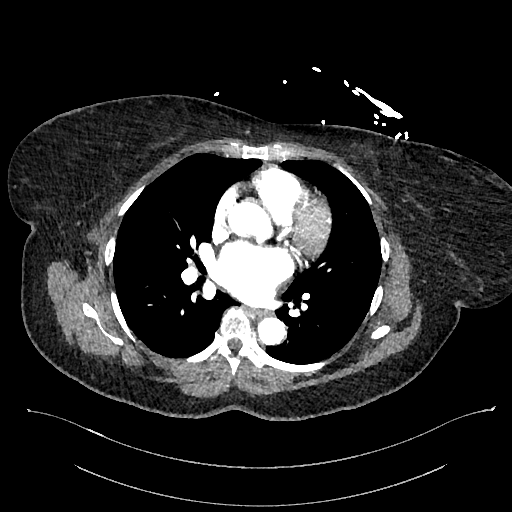
[im 250/300  soft-tissue]
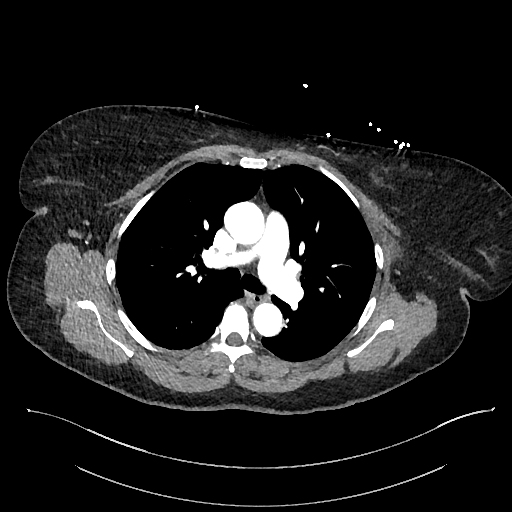
[im 283/300  soft-tissue]
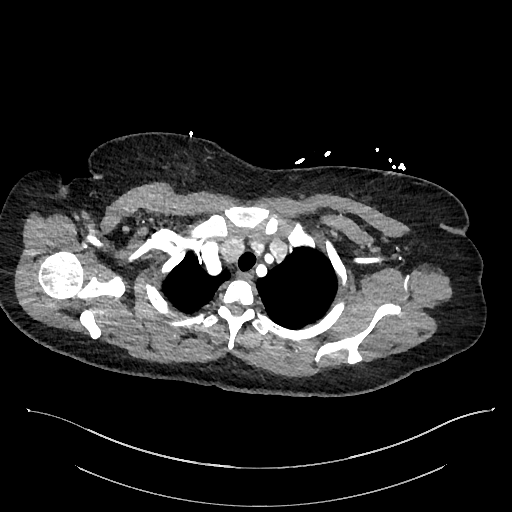

[Series 9: cor · coronal · 0.88mm/px · 3 of 151 slices shown]
[im 38/151  soft-tissue]
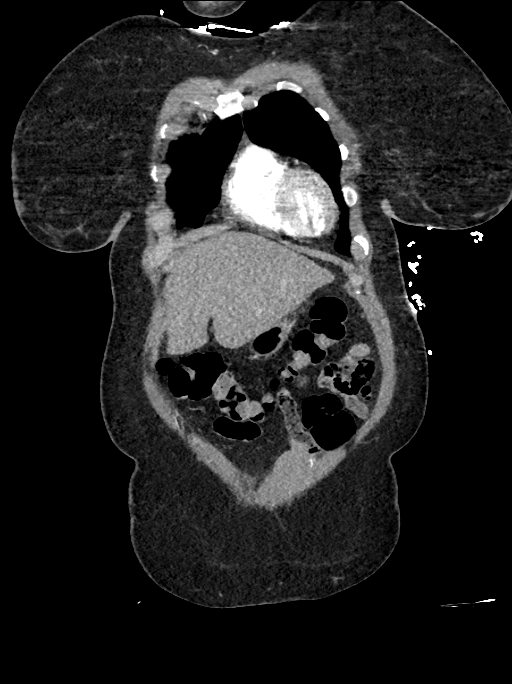
[im 76/151  soft-tissue]
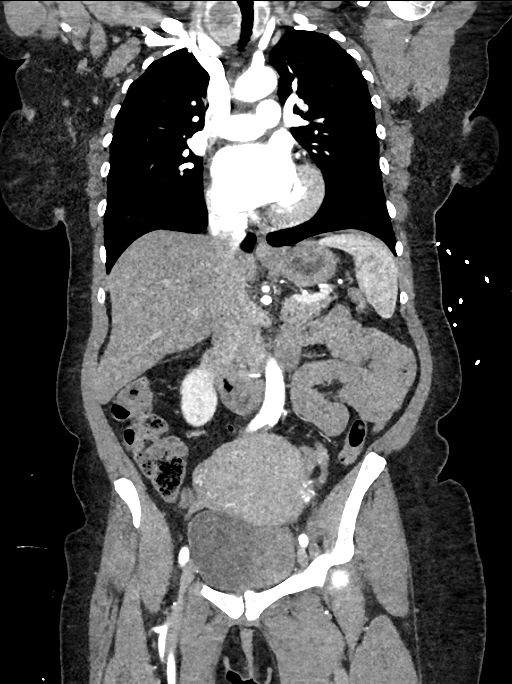
[im 113/151  soft-tissue]
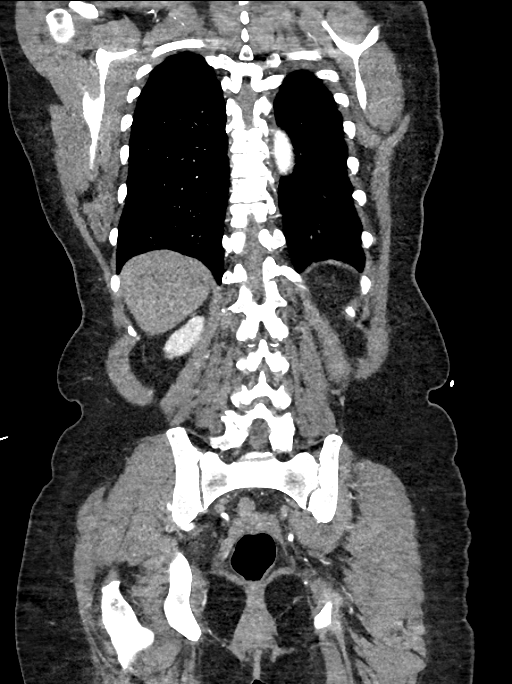

[15 of 46 positions shown; findings below may reference images not displayed]

FINDINGS: CTA CHEST FINDINGS

Cardiovascular: Cardiomegaly noted with a LEFT chamber enlargement.
There is no evidence of thoracic aortic aneurysm or dissection. No
pulmonary emboli are identified. No pericardial effusion.

Mediastinum/Nodes: Multinodular goiter again identified. No
mediastinal mass or enlarged lymph nodes. No definite breast
abnormalities identified. The visualized trachea and esophagus are
unremarkable.

Lungs/Pleura: 4 LEFT LOWER lobe pulmonary nodules are identified, 1
which is near completely calcified and 1 which is partially
calcified. The largest nodule measures 5 mm (series 7: Image 87).

The lungs are otherwise clear. There is no evidence of pleural
effusion, pneumothorax, mass, airspace disease or consolidation.

Musculoskeletal: No acute or suspicious bony abnormalities
identified.

Review of the MIP images confirms the above findings.

CTA ABDOMEN AND PELVIS FINDINGS

VASCULAR

No vascular abnormalities are identified. There is no evidence of
abdominal aortic aneurysm or dissection. No branch vessel
abnormalities are noted.

Review of the MIP images confirms the above findings.

NON-VASCULAR

Hepatobiliary: The liver and gallbladder are unremarkable. No
biliary dilatation.

Pancreas: Unremarkable

Spleen: Unremarkable

Adrenals/Urinary Tract: The kidneys, bladder and adrenal glands are
unremarkable.

Stomach/Bowel: Stomach is within normal limits. Appendix appears
normal. No evidence of bowel wall thickening, distention, or
inflammatory changes.

Lymphatic: No enlarged lymph nodes identified.

Reproductive: The uterus is markedly enlarged measuring 9 x 11.1 x
12 cm likely representing a fibroid uterus. No adnexal abnormalities
are identified.

Other: No ascites, pneumoperitoneum or focal collection.

Musculoskeletal: No acute or suspicious bony abnormalities are
identified.

Review of the MIP images confirms the above findings.
IMPRESSION: 1. No acute abnormality. Unremarkable aorta - no evidence of
thoracic or abdominal aortic dissection/aneurysm. No evidence of
pulmonary emboli.
2. Markedly enlarged uterus likely representing multiple fibroid
uterus.
3. Four LEFT LOWER lobe pulmonary nodules. Given that 2 of these
nodules are calcified and benign, no follow-up needed if patient is
low-risk (and has no known or suspected primary neoplasm).
Non-contrast chest CT can be considered in 12 months if patient is
high-risk. This recommendation follows the consensus statement:
Guidelines for Management of Incidental Pulmonary Nodules Detected
4. Cardiomegaly.
5. Multinodular goiter.

## 2020-02-06 IMAGING — DX DG CHEST 2V
2 series · 2 of 2 positions shown · non-contrast
Comparison: December 13, 2018

CLINICAL DATA: Shortness of breath

EXAM:
CHEST - 2 VIEW

[chest pa]
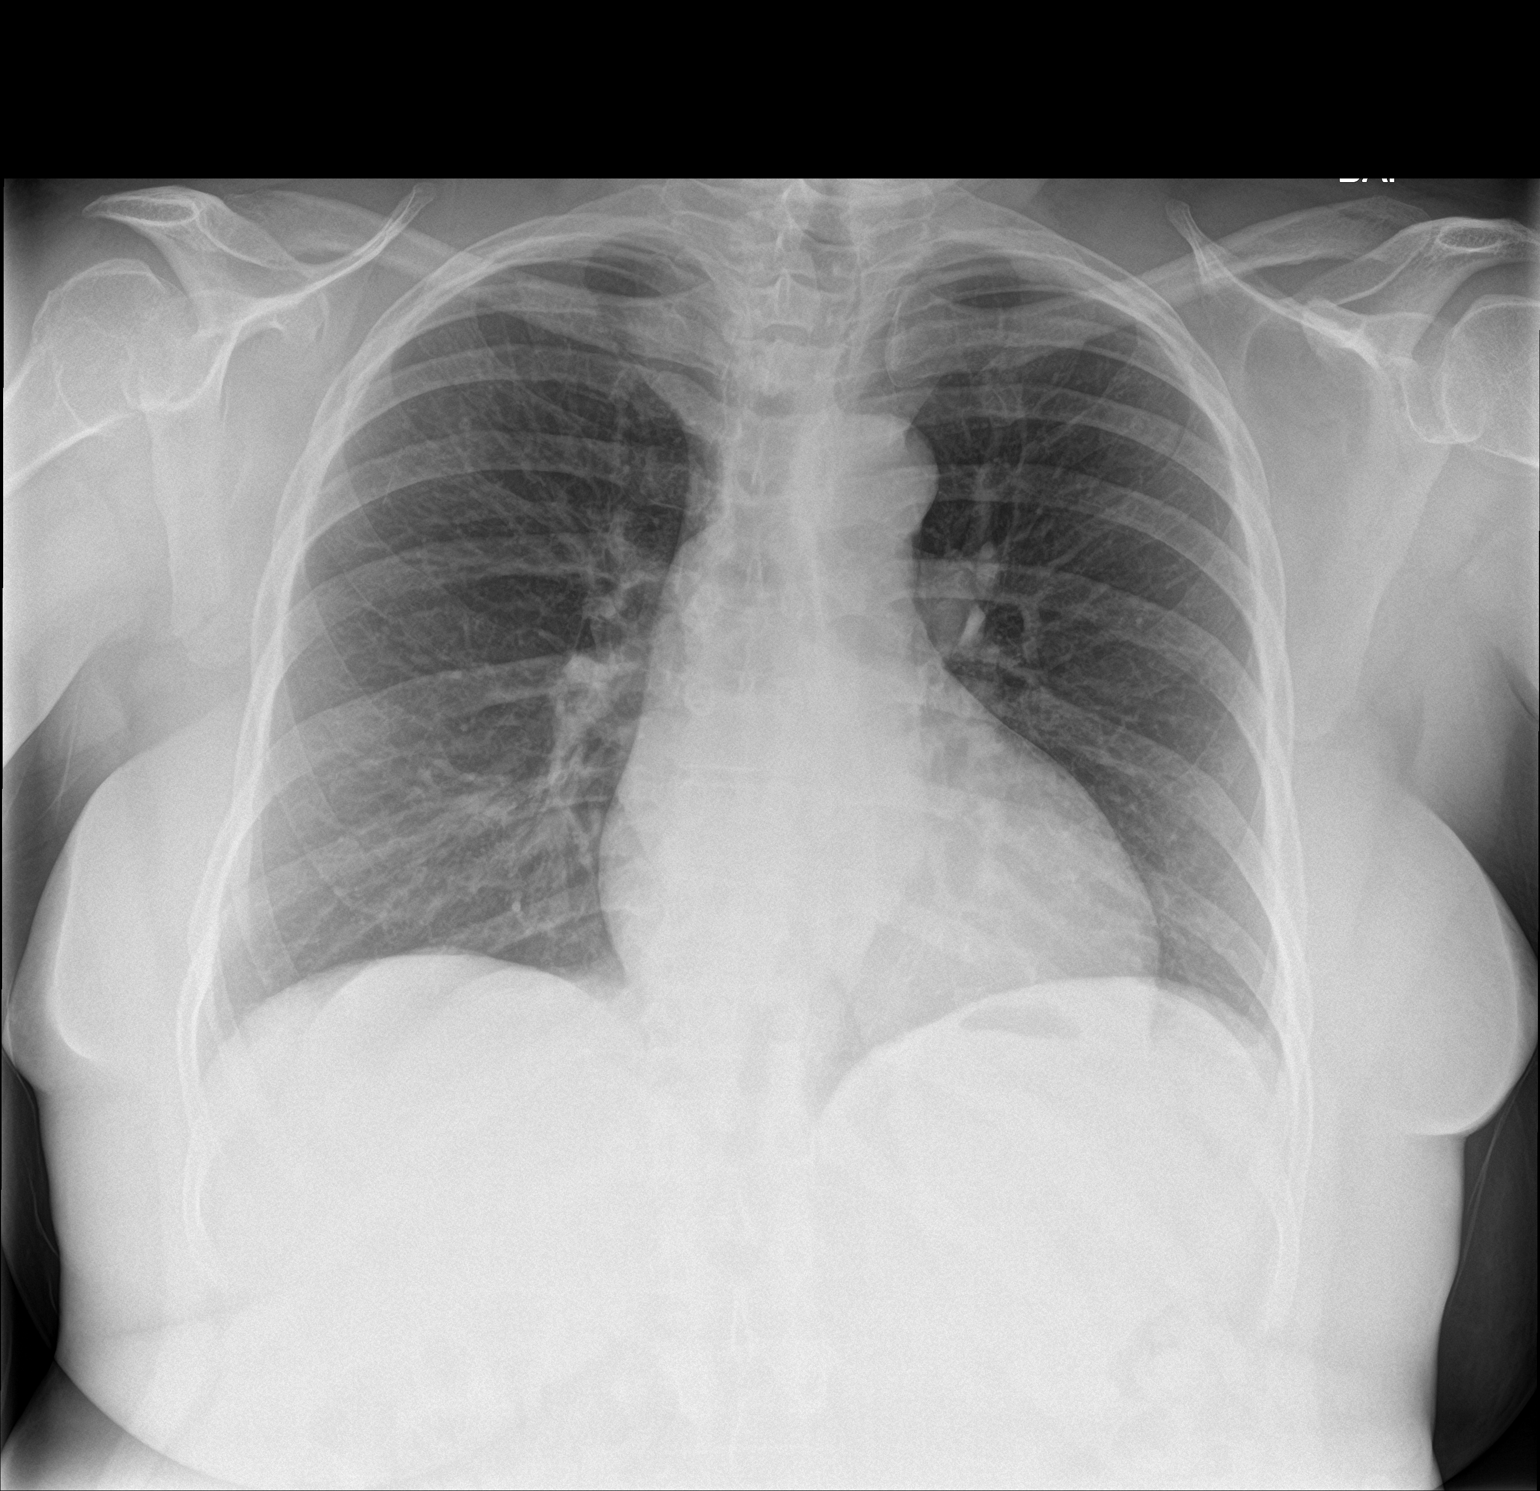

[chest lat]
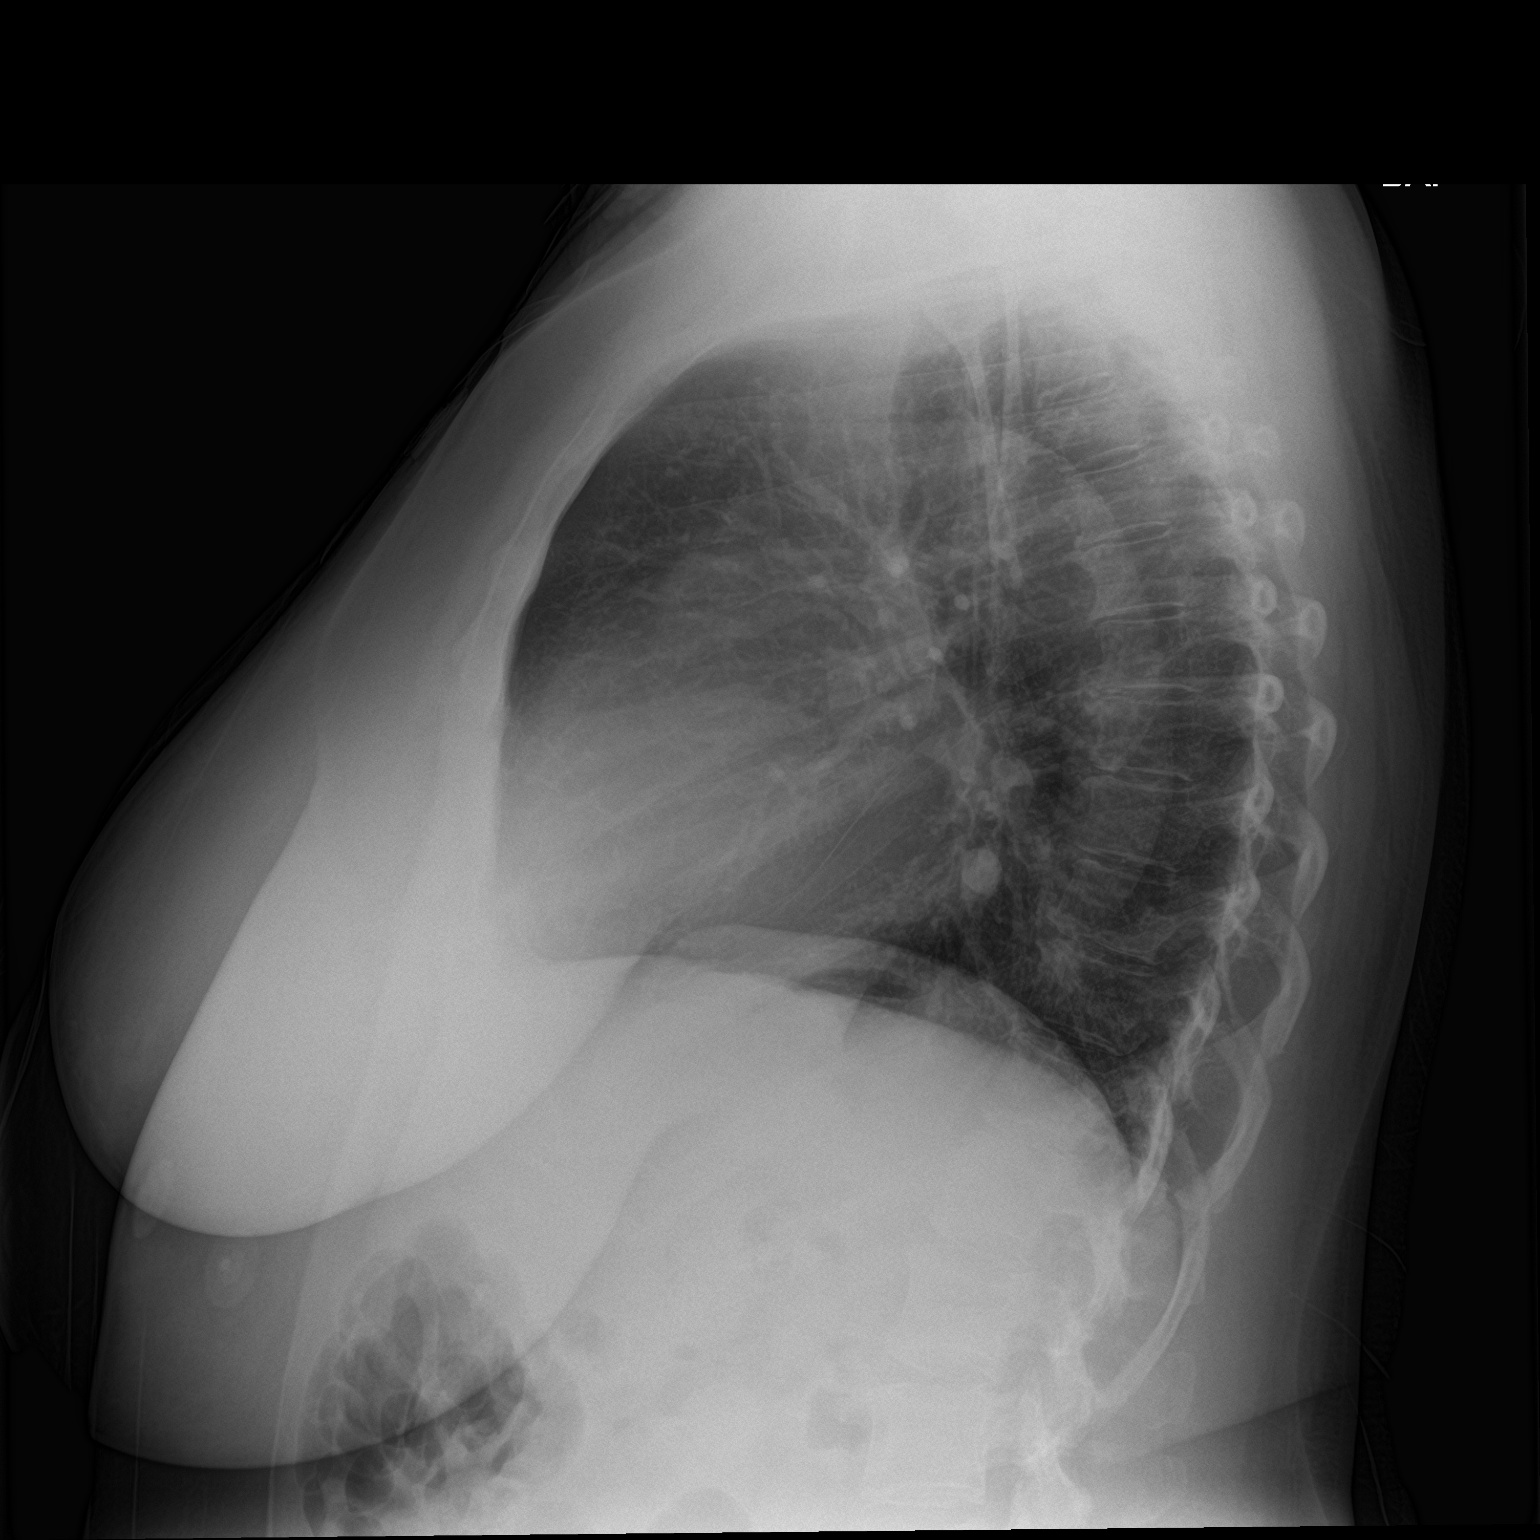

[2 of 2 positions shown; findings below may reference images not displayed]

FINDINGS: The lungs are clear. The heart size and pulmonary vascularity are
normal. No adenopathy. No bone lesions.
IMPRESSION: No edema or consolidation.

## 2020-09-10 ENCOUNTER — Emergency Department (HOSPITAL_COMMUNITY): Payer: Self-pay

## 2020-09-10 ENCOUNTER — Other Ambulatory Visit: Payer: Self-pay

## 2020-09-10 ENCOUNTER — Emergency Department (HOSPITAL_COMMUNITY)
Admission: EM | Admit: 2020-09-10 | Discharge: 2020-09-10 | Disposition: A | Discharge status: Home / Self Care | Payer: Self-pay | Attending: Emergency Medicine | Admitting: Emergency Medicine

## 2020-09-10 ENCOUNTER — Encounter (HOSPITAL_COMMUNITY): Payer: Self-pay

## 2020-09-10 DIAGNOSIS — E876 Hypokalemia: Secondary | ICD-10-CM | POA: Insufficient documentation

## 2020-09-10 DIAGNOSIS — Z7982 Long term (current) use of aspirin: Secondary | ICD-10-CM | POA: Insufficient documentation

## 2020-09-10 DIAGNOSIS — I110 Hypertensive heart disease with heart failure: Secondary | ICD-10-CM | POA: Insufficient documentation

## 2020-09-10 DIAGNOSIS — I509 Heart failure, unspecified: Secondary | ICD-10-CM | POA: Insufficient documentation

## 2020-09-10 DIAGNOSIS — I160 Hypertensive urgency: Secondary | ICD-10-CM

## 2020-09-10 DIAGNOSIS — I161 Hypertensive emergency: Secondary | ICD-10-CM | POA: Insufficient documentation

## 2020-09-10 DIAGNOSIS — Z79899 Other long term (current) drug therapy: Secondary | ICD-10-CM | POA: Insufficient documentation

## 2020-09-10 LAB — CBC
HCT: 41.4 % (ref 36.0–46.0)
Hemoglobin: 13.2 g/dL (ref 12.0–15.0)
MCH: 29.1 pg (ref 26.0–34.0)
MCHC: 31.9 g/dL (ref 30.0–36.0)
MCV: 91.4 fL (ref 80.0–100.0)
Platelets: 190 K/uL (ref 150–400)
RBC: 4.53 MIL/uL (ref 3.87–5.11)
RDW: 14.4 % (ref 11.5–15.5)
WBC: 8.3 K/uL (ref 4.0–10.5)
nRBC: 0 % (ref 0.0–0.2)

## 2020-09-10 LAB — BASIC METABOLIC PANEL WITH GFR
Anion gap: 7 (ref 5–15)
BUN: 13 mg/dL (ref 6–20)
CO2: 28 mmol/L (ref 22–32)
Calcium: 9.1 mg/dL (ref 8.9–10.3)
Chloride: 100 mmol/L (ref 98–111)
Creatinine, Ser: 0.81 mg/dL (ref 0.44–1.00)
GFR, Estimated: >60 mL/min
Glucose, Bld: 108 mg/dL — ABNORMAL HIGH (ref 70–99)
Potassium: 3 mmol/L — ABNORMAL LOW (ref 3.5–5.1)
Sodium: 135 mmol/L (ref 135–145)

## 2020-09-10 LAB — TROPONIN I (HIGH SENSITIVITY)
Troponin I (High Sensitivity): 23 ng/L — ABNORMAL HIGH
Troponin I (High Sensitivity): 19 ng/L — ABNORMAL HIGH (ref <18)

## 2020-09-10 MED ORDER — AMLODIPINE BESYLATE 5 MG PO TABS
10.0000 mg | ORAL_TABLET | Freq: Once | ORAL | Status: AC
  Administered 2020-09-10: 10 mg via ORAL
  Filled 2020-09-10: qty 2

## 2020-09-10 MED ORDER — TRIAMTERENE-HCTZ 37.5-25 MG PO CAPS: 1.0000 | ORAL_CAPSULE | Freq: Every day | ORAL | 1 refills | Status: DC

## 2020-09-10 MED ORDER — ALBUTEROL SULFATE HFA 108 (90 BASE) MCG/ACT IN AERS: 2.0000 | INHALATION_SPRAY | RESPIRATORY_TRACT | Status: DC | PRN

## 2020-09-10 MED ORDER — NITROGLYCERIN 0.4 MG SL SUBL
0.4000 mg | SUBLINGUAL_TABLET | SUBLINGUAL | Status: DC | PRN
  Administered 2020-09-10: 0.4 mg via SUBLINGUAL
  Filled 2020-09-10: qty 1

## 2020-09-10 MED ORDER — AMLODIPINE BESYLATE 10 MG PO TABS: 10.0000 mg | ORAL_TABLET | Freq: Every day | ORAL | 1 refills | Status: AC

## 2020-09-10 MED ORDER — POTASSIUM CHLORIDE CRYS ER 20 MEQ PO TBCR
40.0000 meq | EXTENDED_RELEASE_TABLET | Freq: Once | ORAL | Status: AC
  Administered 2020-09-10: 40 meq via ORAL
  Filled 2020-09-10: qty 2

## 2020-09-10 MED ORDER — POTASSIUM CHLORIDE CRYS ER 20 MEQ PO TBCR: 20.0000 meq | EXTENDED_RELEASE_TABLET | Freq: Every day | ORAL | 0 refills | Status: DC

## 2020-09-10 NOTE — ED Notes (Signed)
Gave pt one nitro, gave her a headache, does not want another one right now.

## 2020-09-10 NOTE — ED Triage Notes (Signed)
Patient reports she felt like she couldn't sleep and was feeling short of breath, hx of HTN but has not been taking her meds

## 2020-09-10 NOTE — Discharge Instructions (Signed)
If you develop recurrent, continued, or worsening chest pain, shortness of breath, fever, vomiting, abdominal or back pain, or any other new/concerning symptoms then return to the ER for evaluation.  

## 2020-09-10 NOTE — ED Provider Notes (Signed)
[facility] EMERGENCY DEPARTMENT Provider Note   CSN: [id number] Arrival date & time: 09/10/20  0253     History Chief Complaint  Patient presents with  . Shortness of Breath    [name] is a 50 y.o. female.  HPI 50 year old female presents with shortness of breath and a little bit of chest pain.  Started last night around 11 PM.  The shortness of breath was a constant sensation.  Maybe a little bit worse when she walked around but overall is not changing.  Not really positional.  There is no cough or fever.  She has a little bit of chest pressure that mostly feels like she cannot get a full breath.  She rates as about 3 out of 10.  She has been out of her blood pressure medicines and not taking them for a couple months so she did take the triamterene this morning that she had leftover.  No leg swelling currently though her legs do on and off as well.  Her symptoms are overall improved though not 100% gone.  She does note she is actually been a little short of breath all week.  A little bit of on and off headache that she gets often.  No new back pain.  No calf pain.  Past Medical History:  Diagnosis Date  . Blood transfusion without reported diagnosis   . GERD (gastroesophageal reflux disease)   . Hypertension   . Multinodular goiter     Patient Active Problem List   Diagnosis Date Noted  . Hypertensive emergency 12/13/2018  . Elevated troponin 12/13/2018  . AKI (acute kidney injury) ([place]) 12/13/2018  . Goiter 12/13/2018  . Anemia 12/13/2018  . Hypokalemia 12/13/2018  . Multinodular goiter (nontoxic) 12/22/2015  . Asthma 11/19/2015  . Essential hypertension 08/29/2015  . Gastroesophageal reflux disease without esophagitis 08/29/2015  . Allergic rhinitis due to pollen 08/29/2015  . Hepatitis B immune 02/24/2014  . CHF exacerbation ([place]) 10/18/2013  . Dyspnea 10/18/2013  . Chest pain 10/18/2013  . Cardiomegaly 08/23/2012  . Fibroid, uterine  03/17/2012  . HYPERTENSION 02/02/2007    Past Surgical History:  Procedure Laterality Date  . ENDOMETRIAL ABLATION       OB History   No obstetric history on file.     Family History  Problem Relation Age of Onset  . Hypertension Mother   . Hypertension Father   . Diabetes Father   . Diabetes Sister   . Hypertension Sister   . Hypertension Brother   . Hypertension Son   . Cancer Paternal Aunt   . Diabetes Maternal Grandmother   . Hypertension Maternal Grandmother   . Diabetes Paternal Grandmother   . Hypertension Paternal Grandmother   . Diabetes Sister   . Asthma Daughter     Social History   Tobacco Use  . Smoking status: Never Smoker  . Smokeless tobacco: Never Used  Substance Use Topics  . Alcohol use: Yes    Alcohol/week: 10.0 standard drinks    Types: 5 Cans of beer, 5 Shots of liquor per week    Comment: just on weekends  . Drug use: No    Home Medications Prior to Admission medications   Medication Sig Start Date End Date Taking? Authorizing Provider  potassium chloride SA (KLOR-CON) 20 MEQ tablet Take 1 tablet (20 mEq total) by mouth daily. 09/10/20  Yes Sherwood Gambler, MD  amLODipine (NORVASC) 10 MG tablet Take 1 tablet (10 mg total) by mouth daily. 09/10/20  Sherwood Gambler, MD  aspirin EC 81 MG tablet Take 81 mg by mouth daily.    [provider]  Blood Pressure KIT 1 each by Does not apply route daily after breakfast. 12/14/18   Little Ishikawa, MD  triamterene-hydrochlorothiazide (DYAZIDE) 37.5-25 MG capsule Take 1 each (1 capsule total) by mouth daily. 09/10/20   Sherwood Gambler, MD    Allergies    Patient has no known allergies.  Review of Systems   Review of Systems  Constitutional: Negative for fever.  Respiratory: Positive for shortness of breath. Negative for cough.   Cardiovascular: Positive for chest pain. Negative for leg swelling.  Gastrointestinal: Negative for abdominal pain.  Musculoskeletal: Negative for back  pain.  All other systems reviewed and are negative.   Physical Exam Updated Vital Signs BP (!) 199/128   Pulse 75   Temp 98.3 F (36.8 C)   Resp 16   Ht _0  (1.575 m)   Wt 86.2 kg   SpO2 99%   BMI 34.75 kg/m   Physical Exam Vitals and nursing note reviewed.  Constitutional:      General: She is not in acute distress.    Appearance: She is well-developed. She is not ill-appearing or diaphoretic.  HENT:     Head: Normocephalic and atraumatic.     Right Ear: External ear normal.     Left Ear: External ear normal.     Nose: Nose normal.  Eyes:     General:        Right eye: No discharge.        Left eye: No discharge.  Cardiovascular:     Rate and Rhythm: Normal rate and regular rhythm.     Heart sounds: Normal heart sounds.  Pulmonary:     Effort: Pulmonary effort is normal. No tachypnea, accessory muscle usage or respiratory distress.     Breath sounds: Normal breath sounds. No wheezing or rales.  Abdominal:     Palpations: Abdomen is soft.     Tenderness: There is no abdominal tenderness.  Musculoskeletal:     Right lower leg: No edema.     Left lower leg: No edema.  Skin:    General: Skin is warm and dry.  Neurological:     Mental Status: She is alert.  Psychiatric:        Mood and Affect: Mood is not anxious.     ED Results / Procedures / Treatments   Labs (all labs ordered are listed, but only abnormal results are displayed) Labs Reviewed  BASIC METABOLIC PANEL - Abnormal; Notable for the following components:      Result Value   Potassium 3.0 (*)    Glucose, Bld 108 (*)    All other components within normal limits  TROPONIN I (HIGH SENSITIVITY) - Abnormal; Notable for the following components:   Troponin I (High Sensitivity) 23 (*)    All other components within normal limits  TROPONIN I (HIGH SENSITIVITY) - Abnormal; Notable for the following components:   Troponin I (High Sensitivity) 19 (*)    All other components within normal limits  CBC     EKG EKG Interpretation  Date/Time:  Monday Sep 10 2020 03:16:58 EDT Ventricular Rate:  78 PR Interval:  170 QRS Duration: 96 QT Interval:  376 QTC Calculation: 428 R Axis:   21 Text Interpretation: Normal sinus rhythm nonspecific T waves similar to Nov 2020 Confirmed by Sherwood Gambler [phone]) on 09/10/2020 7:41:31 AM   Radiology DG Chest  2 View  Result Date: 09/10/2020 CLINICAL DATA:  Chest pain EXAM: CHEST - 2 VIEW COMPARISON:  03/04/2019 FINDINGS: Lungs are well expanded, symmetric, and clear. No pneumothorax or pleural effusion. Cardiac size within normal limits. Pulmonary vascularity is normal. Osseous structures are age-appropriate. No acute bone abnormality. IMPRESSION: No active cardiopulmonary disease. Electronically Signed   By: Fidela Salisbury MD   On: 09/10/2020 05:26    Procedures Procedures   Medications Ordered in ED Medications  nitroGLYCERIN (NITROSTAT) SL tablet 0.4 mg (0.4 mg Sublingual Given 09/10/20 0911)  potassium chloride SA (KLOR-CON) CR tablet 40 mEq (40 mEq Oral Given 09/10/20 0911)  amLODipine (NORVASC) tablet 10 mg (10 mg Oral Given 09/10/20 0911)    ED Course  I have reviewed the triage vital signs and the nursing notes.  Pertinent labs & imaging results that were available during my care of the patient were reviewed by me and considered in my medical decision making (see chart for details).    MDM Rules/Calculators/A&P                          Patient is feeling much better after treatment with nitroglycerin, Norvasc, and potassium.  My suspicion that this is ACS is pretty low.  Troponins are minimally elevated but similar to prior values and likely related to her hypertension.  I think CHF is unlikely given no significant peripheral edema and clear lungs and clear x-ray.  Highly doubt PE or dissection.  Discussed the importance of getting back on her blood pressure medicine and will refer to [facility] and prescribe her her missing  Norvasc and Dyazide.  We discussed return precautions but she appears stable for discharge home to follow-up with PCP. Final Clinical Impression(s) / ED Diagnoses Final diagnoses:  Hypertensive urgency  Hypokalemia    Rx / DC Orders ED Discharge Orders         Ordered    potassium chloride SA (KLOR-CON) 20 MEQ tablet  Daily        09/10/20 0936    amLODipine (NORVASC) 10 MG tablet  Daily        09/10/20 0936    triamterene-hydrochlorothiazide (DYAZIDE) 37.5-25 MG capsule  Daily        09/10/20 0936           Sherwood Gambler, MD 09/10/20 [phone]

## 2023-03-01 ENCOUNTER — Other Ambulatory Visit: Payer: Self-pay

## 2023-03-01 ENCOUNTER — Emergency Department (HOSPITAL_COMMUNITY): Payer: Self-pay

## 2023-03-01 ENCOUNTER — Inpatient Hospital Stay (HOSPITAL_COMMUNITY)
Admission: EM | Admit: 2023-03-01 | Discharge: 2023-03-04 | DRG: 304 | Disposition: A | Discharge status: Home / Self Care | Payer: Self-pay | Attending: Internal Medicine | Admitting: Internal Medicine

## 2023-03-01 ENCOUNTER — Encounter (HOSPITAL_COMMUNITY): Payer: Self-pay | Admitting: Emergency Medicine

## 2023-03-01 DIAGNOSIS — Z7982 Long term (current) use of aspirin: Secondary | ICD-10-CM

## 2023-03-01 DIAGNOSIS — I110 Hypertensive heart disease with heart failure: Secondary | ICD-10-CM | POA: Diagnosis present

## 2023-03-01 DIAGNOSIS — Z79899 Other long term (current) drug therapy: Secondary | ICD-10-CM

## 2023-03-01 DIAGNOSIS — R7989 Other specified abnormal findings of blood chemistry: Secondary | ICD-10-CM | POA: Diagnosis present

## 2023-03-01 DIAGNOSIS — Z8249 Family history of ischemic heart disease and other diseases of the circulatory system: Secondary | ICD-10-CM

## 2023-03-01 DIAGNOSIS — J9601 Acute respiratory failure with hypoxia: Secondary | ICD-10-CM | POA: Diagnosis present

## 2023-03-01 DIAGNOSIS — R0600 Dyspnea, unspecified: Principal | ICD-10-CM

## 2023-03-01 DIAGNOSIS — J209 Acute bronchitis, unspecified: Secondary | ICD-10-CM | POA: Diagnosis present

## 2023-03-01 DIAGNOSIS — E876 Hypokalemia: Secondary | ICD-10-CM | POA: Diagnosis present

## 2023-03-01 DIAGNOSIS — E871 Hypo-osmolality and hyponatremia: Secondary | ICD-10-CM | POA: Diagnosis present

## 2023-03-01 DIAGNOSIS — K219 Gastro-esophageal reflux disease without esophagitis: Secondary | ICD-10-CM | POA: Diagnosis present

## 2023-03-01 DIAGNOSIS — R739 Hyperglycemia, unspecified: Secondary | ICD-10-CM | POA: Diagnosis present

## 2023-03-01 DIAGNOSIS — I160 Hypertensive urgency: Principal | ICD-10-CM | POA: Diagnosis present

## 2023-03-01 DIAGNOSIS — I5033 Acute on chronic diastolic (congestive) heart failure: Secondary | ICD-10-CM | POA: Diagnosis present

## 2023-03-01 DIAGNOSIS — I169 Hypertensive crisis, unspecified: Secondary | ICD-10-CM | POA: Diagnosis present

## 2023-03-01 DIAGNOSIS — Z91199 Patient's noncompliance with other medical treatment and regimen due to unspecified reason: Secondary | ICD-10-CM

## 2023-03-01 DIAGNOSIS — Z91148 Patient's other noncompliance with medication regimen for other reason: Secondary | ICD-10-CM

## 2023-03-01 DIAGNOSIS — I5032 Chronic diastolic (congestive) heart failure: Secondary | ICD-10-CM | POA: Diagnosis present

## 2023-03-01 DIAGNOSIS — Z825 Family history of asthma and other chronic lower respiratory diseases: Secondary | ICD-10-CM

## 2023-03-01 DIAGNOSIS — Z833 Family history of diabetes mellitus: Secondary | ICD-10-CM

## 2023-03-01 DIAGNOSIS — I10 Essential (primary) hypertension: Secondary | ICD-10-CM

## 2023-03-01 DIAGNOSIS — I2489 Other forms of acute ischemic heart disease: Secondary | ICD-10-CM | POA: Diagnosis present

## 2023-03-01 DIAGNOSIS — Z1152 Encounter for screening for COVID-19: Secondary | ICD-10-CM

## 2023-03-01 LAB — RESP PANEL BY RT-PCR (RSV, FLU A&B, COVID)  RVPGX2
Influenza A by PCR: NEGATIVE
Influenza B by PCR: NEGATIVE
Resp Syncytial Virus by PCR: NEGATIVE
SARS Coronavirus 2 by RT PCR: NEGATIVE

## 2023-03-01 LAB — CBC WITH DIFFERENTIAL/PLATELET
Abs Immature Granulocytes: 0.04 10*3/uL (ref 0.00–0.07)
Basophils Absolute: 0.1 10*3/uL (ref 0.0–0.1)
Basophils Relative: 1 %
Eosinophils Absolute: 0.1 10*3/uL (ref 0.0–0.5)
Eosinophils Relative: 2 %
HCT: 42 % (ref 36.0–46.0)
Hemoglobin: 13.7 g/dL (ref 12.0–15.0)
Immature Granulocytes: 1 %
Lymphocytes Relative: 13 %
Lymphs Abs: 1.1 10*3/uL (ref 0.7–4.0)
MCH: 31.6 pg (ref 26.0–34.0)
MCHC: 32.6 g/dL (ref 30.0–36.0)
MCV: 96.8 fL (ref 80.0–100.0)
Monocytes Absolute: 0.7 10*3/uL (ref 0.1–1.0)
Monocytes Relative: 8 %
Neutro Abs: 6.7 10*3/uL (ref 1.7–7.7)
Neutrophils Relative %: 75 %
Platelets: 168 10*3/uL (ref 150–400)
RBC: 4.34 MIL/uL (ref 3.87–5.11)
RDW: 13.4 % (ref 11.5–15.5)
WBC: 8.8 10*3/uL (ref 4.0–10.5)
nRBC: 0 % (ref 0.0–0.2)

## 2023-03-01 LAB — BASIC METABOLIC PANEL
Anion gap: 15 (ref 5–15)
BUN: 10 mg/dL (ref 6–20)
CO2: 23 mmol/L (ref 22–32)
Calcium: 9.1 mg/dL (ref 8.9–10.3)
Chloride: 100 mmol/L (ref 98–111)
Creatinine, Ser: 0.93 mg/dL (ref 0.44–1.00)
GFR, Estimated: >60 mL/min (ref >60)
Glucose, Bld: 183 mg/dL — ABNORMAL HIGH (ref 70–99)
Potassium: 2.8 mmol/L — ABNORMAL LOW (ref 3.5–5.1)
Sodium: 138 mmol/L (ref 135–145)

## 2023-03-01 LAB — BRAIN NATRIURETIC PEPTIDE: B Natriuretic Peptide: 107.1 pg/mL — ABNORMAL HIGH (ref 0.0–100.0)

## 2023-03-01 LAB — TROPONIN I (HIGH SENSITIVITY): Troponin I (High Sensitivity): 33 ng/L — ABNORMAL HIGH (ref <18)

## 2023-03-01 MED ORDER — ENOXAPARIN SODIUM 40 MG/0.4ML IJ SOSY
40.0000 mg | PREFILLED_SYRINGE | Freq: Every day | INTRAMUSCULAR | Status: DC
  Administered 2023-03-02 – 2023-03-04 (×3): 40 mg via SUBCUTANEOUS
  Filled 2023-03-01 (×3): qty 0.4

## 2023-03-01 MED ORDER — POTASSIUM CHLORIDE 10 MEQ/100ML IV SOLN
10.0000 meq | INTRAVENOUS | Status: DC
  Administered 2023-03-02: 10 meq via INTRAVENOUS
  Filled 2023-03-01: qty 100

## 2023-03-01 MED ORDER — ACETAMINOPHEN 650 MG RE SUPP: 650.0000 mg | Freq: Four times a day (QID) | RECTAL | Status: DC | PRN

## 2023-03-01 MED ORDER — POTASSIUM CHLORIDE CRYS ER 20 MEQ PO TBCR
40.0000 meq | EXTENDED_RELEASE_TABLET | Freq: Once | ORAL | Status: AC
  Administered 2023-03-02: 40 meq via ORAL
  Filled 2023-03-01: qty 2

## 2023-03-01 MED ORDER — ALBUTEROL SULFATE (2.5 MG/3ML) 0.083% IN NEBU
2.5000 mg | INHALATION_SOLUTION | RESPIRATORY_TRACT | Status: DC | PRN
  Administered 2023-03-02 (×2): 2.5 mg via RESPIRATORY_TRACT
  Filled 2023-03-01 (×2): qty 3

## 2023-03-01 MED ORDER — SODIUM CHLORIDE 0.9% FLUSH
3.0000 mL | Freq: Two times a day (BID) | INTRAVENOUS | Status: DC
  Administered 2023-03-02 – 2023-03-04 (×6): 3 mL via INTRAVENOUS

## 2023-03-01 MED ORDER — SENNOSIDES-DOCUSATE SODIUM 8.6-50 MG PO TABS: 1.0000 | ORAL_TABLET | Freq: Every evening | ORAL | Status: DC | PRN

## 2023-03-01 MED ORDER — AMLODIPINE BESYLATE 5 MG PO TABS: 10.0000 mg | ORAL_TABLET | Freq: Every day | ORAL | Status: DC

## 2023-03-01 MED ORDER — OXYCODONE HCL 5 MG PO TABS
5.0000 mg | ORAL_TABLET | ORAL | Status: DC | PRN
  Administered 2023-03-02 (×2): 5 mg via ORAL
  Filled 2023-03-01 (×2): qty 1

## 2023-03-01 MED ORDER — POTASSIUM CHLORIDE CRYS ER 20 MEQ PO TBCR
20.0000 meq | EXTENDED_RELEASE_TABLET | Freq: Every day | ORAL | Status: DC
  Administered 2023-03-02: 20 meq via ORAL
  Filled 2023-03-01: qty 1

## 2023-03-01 MED ORDER — ACETAMINOPHEN 325 MG PO TABS
650.0000 mg | ORAL_TABLET | Freq: Four times a day (QID) | ORAL | Status: DC | PRN
  Administered 2023-03-02 – 2023-03-04 (×4): 650 mg via ORAL
  Filled 2023-03-01 (×4): qty 2

## 2023-03-01 MED ORDER — LABETALOL HCL 5 MG/ML IV SOLN
20.0000 mg | Freq: Once | INTRAVENOUS | Status: AC
  Administered 2023-03-02: 20 mg via INTRAVENOUS
  Filled 2023-03-01: qty 4

## 2023-03-01 MED ORDER — MAGNESIUM SULFATE IN D5W 1-5 GM/100ML-% IV SOLN
1.0000 g | Freq: Once | INTRAVENOUS | Status: AC
  Administered 2023-03-02: 1 g via INTRAVENOUS
  Filled 2023-03-01: qty 100

## 2023-03-01 MED ORDER — TRIAMTERENE-HCTZ 37.5-25 MG PO TABS: 1.0000 | ORAL_TABLET | Freq: Every day | ORAL | Status: DC

## 2023-03-01 NOTE — ED Provider Notes (Addendum)
[name] EMERGENCY DEPARTMENT AT [facility] Provider Note   CSN: [id number] Arrival date & time: 03/01/23  2047     History  Chief Complaint  Patient presents with   Shortness of Breath    [name] is a 52 y.o. female.   Shortness of Breath Patient presents with shortness of breath.  States started coughing last night.  Some production.  No fevers.  Brought in by EMS after breathing treatment.  Does have hypertension.  Report is been feeling bad for the last couple days and has not taken her medicines because of it.  Does have a history of hypertension.    Past Medical History:  Diagnosis Date   Blood transfusion without reported diagnosis    GERD (gastroesophageal reflux disease)    Hypertension    Multinodular goiter     Home Medications Prior to Admission medications   Medication Sig Start Date End Date Taking? Authorizing Provider  amLODipine (NORVASC) 10 MG tablet Take 1 tablet (10 mg total) by mouth daily. 09/10/20   Pricilla Loveless, MD  aspirin EC 81 MG tablet Take 81 mg by mouth daily.    [provider]  Blood Pressure KIT 1 each by Does not apply route daily after breakfast. 12/14/18   Azucena Fallen, MD  potassium chloride SA (KLOR-CON) 20 MEQ tablet Take 1 tablet (20 mEq total) by mouth daily. 09/10/20   Pricilla Loveless, MD  triamterene-hydrochlorothiazide (DYAZIDE) 37.5-25 MG capsule Take 1 each (1 capsule total) by mouth daily. 09/10/20   Pricilla Loveless, MD      Allergies    Patient has no known allergies.    Review of Systems   Review of Systems  Respiratory:  Positive for shortness of breath.     Physical Exam Updated Vital Signs BP (!) 224/124 (BP Location: Right Arm)   Pulse (!) 104   Temp 98.2 F (36.8 C) (Oral)   Resp (!) 28   LMP 12/04/2018   SpO2 92%  Physical Exam Vitals and nursing note reviewed.  Cardiovascular:     Rate and Rhythm: Normal rate.  Pulmonary:     Comments: Harsh breath sounds.  No  frank wheezes. Chest:     Chest wall: No tenderness.  Abdominal:     Tenderness: There is no abdominal tenderness.  Musculoskeletal:     Right lower leg: No edema.     Left lower leg: No edema.  Skin:    General: Skin is warm.  Neurological:     Mental Status: She is alert.     ED Results / Procedures / Treatments   Labs (all labs ordered are listed, but only abnormal results are displayed) Labs Reviewed  BRAIN NATRIURETIC PEPTIDE - Abnormal; Notable for the following components:      Result Value   B Natriuretic Peptide 107.1 (*)    All other components within normal limits  BASIC METABOLIC PANEL - Abnormal; Notable for the following components:   Potassium 2.8 (*)    Glucose, Bld 183 (*)    All other components within normal limits  TROPONIN I (HIGH SENSITIVITY) - Abnormal; Notable for the following components:   Troponin I (High Sensitivity) 33 (*)    All other components within normal limits  RESP PANEL BY RT-PCR (RSV, FLU A&B, COVID)  [id number]  CBC WITH DIFFERENTIAL/PLATELET  TROPONIN I (HIGH SENSITIVITY)    EKG EKG Interpretation Date/Time:  Sunday March 01 2023 22:09:17 EST Ventricular Rate:  102 PR Interval:  148 QRS Duration:  105 QT Interval:  375 QTC Calculation: 489 R Axis:   3  Text Interpretation: Sinus tachycardia LAE, consider biatrial enlargement Abnormal R-wave progression, late transition Left ventricular hypertrophy Borderline prolonged QT interval Confirmed by Benjiman Core [phone]) on 03/01/2023 11:02:08 PM  Radiology DG Chest Portable 1 View  Result Date: 03/01/2023 CLINICAL DATA:  Shortness of breath EXAM: PORTABLE CHEST 1 VIEW COMPARISON:  09/10/2020 FINDINGS: The heart size and mediastinal contours are within normal limits. Both lungs are clear. The visualized skeletal structures are unremarkable. IMPRESSION: No active disease. Electronically Signed   By: Jasmine Pang M.D.   On: 03/01/2023 21:37    Procedures Procedures     Medications Ordered in ED Medications  labetalol (NORMODYNE) injection 20 mg (has no administration in time range)    ED Course/ Medical Decision Making/ A&P                                 Medical Decision Making Amount and/or Complexity of Data Reviewed Labs: ordered. Radiology: ordered.  Risk Prescription drug management.   Patient shortness of breath.  Had for last couple days.  Cough.  Hypertension due to not taking her medicine.  States blood pressures come down since she was at home though.  Feeling somewhat better after breathing treatment.  States she feels that it may have been some anxiety.  No swelling of legs.  Differential diagnosis does include pneumonia and viral syndrome.  Will get chest x-ray.  Chest x-ray reassuring.  Will get basic blood work and reevaluate blood pressure.  Has been noncompliant with her medicines.  Troponin elevated.  BNP is somewhat elevated.  Has had some chest tightness.  I think with the lab abnormality she would benefit from more acute treatment of the hypertension.  Will give some IV labetalol.  Will admit to medicine.   CRITICAL CARE Performed by: Benjiman Core Total critical care time: 30 minutes Critical care time was exclusive of separately billable procedures and treating other patients. Critical care was necessary to treat or prevent imminent or life-threatening deterioration. Critical care was time spent personally by me on the following activities: development of treatment plan with patient and/or surrogate as well as nursing, discussions with consultants, evaluation of patient's response to treatment, examination of patient, obtaining history from patient or surrogate, ordering and performing treatments and interventions, ordering and review of laboratory studies, ordering and review of radiographic studies, pulse oximetry and re-evaluation of patient's condition.         Final Clinical Impression(s) / ED  Diagnoses Final diagnoses:  Dyspnea, unspecified type  Hypertension, unspecified type  Noncompliance w/medication treatment due to intermit use of medication    Rx / DC Orders ED Discharge Orders     None         Benjiman Core, MD 03/01/23 2303    Benjiman Core, MD 03/01/23 2321

## 2023-03-01 NOTE — ED Triage Notes (Signed)
Patient BIB EMS for evaluation of SHOB.  Patient reports symptoms started yesterday.  Has had a productive cough.  No reports of fevers.  EMS reports + wheezing and increased respiratory rate on arrival.  Given a total of Albuterol 10 mg neb prior to arrival with improvement in symptoms.  Hypertensive on arrival.   Hx of HTN.  Has not taken medication x several days.

## 2023-03-01 NOTE — H&P (Incomplete)
History and Physical    [name] NWG:[id number] DOB: Feb 21, 1971 DOA: 03/01/2023  PCP: Dois Davenport, MD   Patient coming from: Home   Chief Complaint: SOB, cough  HPI: [name] is a 52 y.o. female with medical history significant for hypertension who presents to the emergency department with shortness of breath and cough.  Patient developed cough and shortness of breath yesterday morning.  Symptoms worsened this evening to the point where she was having difficulty catching her breath, prompting her to call EMS.  She is found to be tachypneic with wheezing and was given 10 mg nebulized albuterol prior to arrival in the ED.  Patient reports significant improvement with this.  She denies fever or sick contacts.  She has had a headache which she describes as "not bad" and similar to her frequent headaches.  She has not taken her antihypertensives recently.  There is no chest pain, vision change, focal numbness, or focal weakness.   "Asthma" noted on problem list but patient denies any history of this.   ED Course: Upon arrival to the ED, patient is found to be afebrile and saturating well on room air with tachypnea, mild tachycardia, and severely elevated blood pressure.  EKG demonstrates sinus tachycardia with rate 102 and LVH.  No acute findings are noted on chest x-ray.  Respiratory virus panel is negative.  Labs most notable for troponin 33, BNP 807, normal WBC, normal renal function, and potassium 2.8.  Patient was treated with 20 mg IV labetalol in the ED.  Review of Systems:  All other systems reviewed and apart from HPI, are negative.  Past Medical History:  Diagnosis Date   Blood transfusion without reported diagnosis    GERD (gastroesophageal reflux disease)    Hypertension    Multinodular goiter     Past Surgical History:  Procedure Laterality Date   ENDOMETRIAL ABLATION      Social History:   reports that she has never smoked. She has never used smokeless  tobacco. She reports current alcohol use of about 10.0 standard drinks of alcohol per week. She reports that she does not use drugs.  No Known Allergies  Family History  Problem Relation Age of Onset   Hypertension Mother    Hypertension Father    Diabetes Father    Diabetes Sister    Hypertension Sister    Hypertension Brother    Hypertension Son    Cancer Paternal Aunt    Diabetes Maternal Grandmother    Hypertension Maternal Grandmother    Diabetes Paternal Grandmother    Hypertension Paternal Grandmother    Diabetes Sister    Asthma Daughter      Prior to Admission medications   Medication Sig Start Date End Date Taking? Authorizing Provider  amLODipine (NORVASC) 10 MG tablet Take 1 tablet (10 mg total) by mouth daily. 09/10/20   Pricilla Loveless, MD  aspirin EC 81 MG tablet Take 81 mg by mouth daily.    [provider]  Blood Pressure KIT 1 each by Does not apply route daily after breakfast. 12/14/18   Azucena Fallen, MD  potassium chloride SA (KLOR-CON) 20 MEQ tablet Take 1 tablet (20 mEq total) by mouth daily. 09/10/20   Pricilla Loveless, MD  triamterene-hydrochlorothiazide (DYAZIDE) 37.5-25 MG capsule Take 1 each (1 capsule total) by mouth daily. 09/10/20   Pricilla Loveless, MD    Physical Exam: Vitals:   03/01/23 2104 03/01/23 2305  BP: (!) 173/121 (!) 224/124  Pulse: Marland Kitchen)  116 (!) 104  Resp: 17 (!) 28  Temp: 98.2 F (36.8 C)   TempSrc: Oral   SpO2: 94% 92%    Constitutional: NAD, no pallor or diaphoresis  Eyes: PERTLA, lids and conjunctivae normal ENMT: Mucous membranes are moist. Posterior pharynx clear of any exudate or lesions.   Neck: supple, no masses  Respiratory: Speaking full sentences. No crackles, occasional wheeze.    Cardiovascular: S1 & S2 heard, regular rate and rhythm. No extremity edema.  Abdomen: No distension, no tenderness, soft. Bowel sounds active.  Musculoskeletal: no clubbing / cyanosis. No joint deformity upper and lower  extremities.   Skin: no significant rashes, lesions, ulcers. Warm, dry, well-perfused. Neurologic: CN 2-12 grossly intact. Moving all extremities. Alert and oriented.  Psychiatric: Pleasant. Cooperative.    Labs and Imaging on Admission: I have personally reviewed following labs and imaging studies  CBC: Recent Labs  Lab 03/01/23 2208  WBC 8.8  NEUTROABS 6.7  HGB 13.7  HCT 42.0  MCV 96.8  PLT 168   Basic Metabolic Panel: Recent Labs  Lab 03/01/23 2208  NA 138  K 2.8*  CL 100  CO2 23  GLUCOSE 183*  BUN 10  CREATININE 0.93  CALCIUM 9.1   GFR: CrCl cannot be calculated (Unknown ideal weight.). Liver Function Tests: No results for input(s): "AST", "ALT", "ALKPHOS", "BILITOT", "PROT", "ALBUMIN" in the last 168 hours. No results for input(s): "LIPASE", "AMYLASE" in the last 168 hours. No results for input(s): "AMMONIA" in the last 168 hours. Coagulation Profile: No results for input(s): "INR", "PROTIME" in the last 168 hours. Cardiac Enzymes: No results for input(s): "CKTOTAL", "CKMB", "CKMBINDEX", "TROPONINI" in the last 168 hours. BNP (last 3 results) No results for input(s): "PROBNP" in the last 8760 hours. HbA1C: No results for input(s): "HGBA1C" in the last 72 hours. CBG: No results for input(s): "GLUCAP" in the last 168 hours. Lipid Profile: No results for input(s): "CHOL", "HDL", "LDLCALC", "TRIG", "CHOLHDL", "LDLDIRECT" in the last 72 hours. Thyroid Function Tests: No results for input(s): "TSH", "T4TOTAL", "FREET4", "T3FREE", "THYROIDAB" in the last 72 hours. Anemia Panel: No results for input(s): "VITAMINB12", "FOLATE", "FERRITIN", "TIBC", "IRON", "RETICCTPCT" in the last 72 hours. Urine analysis:    Component Value Date/Time   COLORURINE RED (A) 12/13/2018 2346   APPEARANCEUR HAZY (A) 12/13/2018 2346   LABSPEC 1.044 (H) 12/13/2018 2346   PHURINE 6.0 12/13/2018 2346   GLUCOSEU NEGATIVE 12/13/2018 2346   HGBUR LARGE (A) 12/13/2018 2346   BILIRUBINUR  NEGATIVE 12/13/2018 2346   BILIRUBINUR negative 07/10/2016 1213   BILIRUBINUR neg 07/29/2012 0937   KETONESUR NEGATIVE 12/13/2018 2346   PROTEINUR 100 (A) 12/13/2018 2346   UROBILINOGEN 0.2 07/10/2016 1213   UROBILINOGEN 0.2 07/09/2010 1842   NITRITE NEGATIVE 12/13/2018 2346   LEUKOCYTESUR NEGATIVE 12/13/2018 2346   Sepsis Labs: @LABRCNTIP (procalcitonin:4,lacticidven:4) ) Recent Results (from the past 240 hour(s))  Resp panel by RT-PCR (RSV, Flu A&B, Covid) Anterior Nasal Swab     Status: None   Collection Time: 03/01/23 10:08 PM   Specimen: Anterior Nasal Swab  Result Value Ref Range Status   SARS Coronavirus 2 by RT PCR NEGATIVE NEGATIVE Final   Influenza A by PCR NEGATIVE NEGATIVE Final   Influenza B by PCR NEGATIVE NEGATIVE Final    Comment: (NOTE) The Xpert Xpress SARS-CoV-2/FLU/RSV plus assay is intended as an aid in the diagnosis of influenza from Nasopharyngeal swab specimens and should not be used as a sole basis for treatment. Nasal washings and aspirates are unacceptable for  Xpert Xpress SARS-CoV-2/FLU/RSV testing.  Fact Sheet for Patients: BloggerCourse.com  Fact Sheet for Healthcare Providers: SeriousBroker.it  This test is not yet approved or cleared by the [place] FDA and has been authorized for detection and/or diagnosis of SARS-CoV-2 by FDA under an Emergency Use Authorization (EUA). This EUA will remain in effect (meaning this test can be used) for the duration of the COVID-19 declaration under Section 564(b)(1) of the Act, 21 U.S.C. section 360bbb-3(b)(1), unless the authorization is terminated or revoked.     Resp Syncytial Virus by PCR NEGATIVE NEGATIVE Final    Comment: (NOTE) Fact Sheet for Patients: BloggerCourse.com  Fact Sheet for Healthcare Providers: SeriousBroker.it  This test is not yet approved or cleared by the [place] FDA  and has been authorized for detection and/or diagnosis of SARS-CoV-2 by FDA under an Emergency Use Authorization (EUA). This EUA will remain in effect (meaning this test can be used) for the duration of the COVID-19 declaration under Section 564(b)(1) of the Act, 21 U.S.C. section 360bbb-3(b)(1), unless the authorization is terminated or revoked.  Performed at [facility], [address], [place], [place] [address]      Radiological Exams on Admission: DG Chest Portable 1 View  Result Date: 03/01/2023 CLINICAL DATA:  Shortness of breath EXAM: PORTABLE CHEST 1 VIEW COMPARISON:  09/10/2020 FINDINGS: The heart size and mediastinal contours are within normal limits. Both lungs are clear. The visualized skeletal structures are unremarkable. IMPRESSION: No active disease. Electronically Signed   By: Jasmine Pang M.D.   On: 03/01/2023 21:37    EKG: Independently reviewed. Sinus tachycardia, rate 102, LVH.   Assessment/Plan   1. Hypertensive crisis  - BP as high as 224/124 in setting of medication non-compliance  - There is mild troponin elevation without angina; no other evidence for target organ damage  - Treated with 20 mg IV labetalol in ED  - Resume amlodipine and triamterene-HCTZ with dose now and continue labetalol as needed for goal MAP 110-120 overnight     2. Elevated troponin  - Troponin is 33 in ED without chest pain or acute ischemic features on EKG   - Continue BP-control, repeat troponin, do not anticipate further inpatient evaluation unless second troponin significantly increased   3. Bronchospasm   - Presents with cough, wheezing, and SOB  - Denies hx of asthma or COPD  - COVID, RSV, and influenza PCR are negative and no acute findings noted on CXR  - Improved with albuterol given by EMS, will continue supportive care, as-needed albuterol   4. Hypokalemia  - Replacing   5. Chronic HFpEF  - Appears compensated  - Monitor weight and I/Os   ADDENDUM (04:00):  Pt seen for increasing SOB. She remains A&O x4, no pallor or diaphoresis, sitting upright with labored respirations, fine rales bilaterally, RR mid-20s, saturating upper 90s on 2 Lpm. Troponin is rising. MAP not reaching goal with IV labetalol. Plan to give a dose of IV Lasix, start nitroglycerin infusion for goal MAP 110-120.    DVT prophylaxis: Lovenox  Code Status: Full   Level of Care: Level of care: Progressive Family Communication: None present   Disposition Plan:  Patient is from: home  Anticipated d/c is to: Home  Anticipated d/c date is: 11/4 or 03/03/23  Patient currently: Pending BP-control, electrolyte replacement, stable respiratory status  Consults called: None  Admission status: Observation     Briscoe Deutscher, MD [facility]  03/01/2023, 11:46 PM

## 2023-03-02 ENCOUNTER — Observation Stay (HOSPITAL_COMMUNITY): Payer: Self-pay

## 2023-03-02 DIAGNOSIS — I5033 Acute on chronic diastolic (congestive) heart failure: Secondary | ICD-10-CM

## 2023-03-02 DIAGNOSIS — J810 Acute pulmonary edema: Secondary | ICD-10-CM

## 2023-03-02 DIAGNOSIS — J9601 Acute respiratory failure with hypoxia: Secondary | ICD-10-CM

## 2023-03-02 DIAGNOSIS — E876 Hypokalemia: Secondary | ICD-10-CM

## 2023-03-02 DIAGNOSIS — R7989 Other specified abnormal findings of blood chemistry: Secondary | ICD-10-CM

## 2023-03-02 DIAGNOSIS — I169 Hypertensive crisis, unspecified: Secondary | ICD-10-CM

## 2023-03-02 LAB — CBC
HCT: 44.5 % (ref 36.0–46.0)
Hemoglobin: 14.6 g/dL (ref 12.0–15.0)
MCH: 31.9 pg (ref 26.0–34.0)
MCHC: 32.8 g/dL (ref 30.0–36.0)
MCV: 97.4 fL (ref 80.0–100.0)
Platelets: 190 10*3/uL (ref 150–400)
RBC: 4.57 MIL/uL (ref 3.87–5.11)
RDW: 13.5 % (ref 11.5–15.5)
WBC: 10.8 10*3/uL — ABNORMAL HIGH (ref 4.0–10.5)
nRBC: 0 % (ref 0.0–0.2)

## 2023-03-02 LAB — TROPONIN I (HIGH SENSITIVITY)
Troponin I (High Sensitivity): 51 ng/L — ABNORMAL HIGH (ref <18)
Troponin I (High Sensitivity): 70 ng/L — ABNORMAL HIGH (ref <18)

## 2023-03-02 LAB — GLUCOSE, CAPILLARY
Glucose-Capillary: 323 mg/dL — ABNORMAL HIGH (ref 70–99)
Glucose-Capillary: 314 mg/dL — ABNORMAL HIGH (ref 70–99)
Glucose-Capillary: 231 mg/dL — ABNORMAL HIGH (ref 70–99)

## 2023-03-02 LAB — BASIC METABOLIC PANEL
Anion gap: 11 (ref 5–15)
BUN: 8 mg/dL (ref 6–20)
CO2: 24 mmol/L (ref 22–32)
Calcium: 9.4 mg/dL (ref 8.9–10.3)
Chloride: 101 mmol/L (ref 98–111)
Creatinine, Ser: 0.82 mg/dL (ref 0.44–1.00)
GFR, Estimated: >60 mL/min (ref >60)
Glucose, Bld: 151 mg/dL — ABNORMAL HIGH (ref 70–99)
Potassium: 4.2 mmol/L (ref 3.5–5.1)
Sodium: 136 mmol/L (ref 135–145)

## 2023-03-02 LAB — HEMOGLOBIN A1C
Hgb A1c MFr Bld: 6.1 % — ABNORMAL HIGH (ref 4.8–5.6)
Mean Plasma Glucose: 128.37 mg/dL

## 2023-03-02 LAB — HIV ANTIBODY (ROUTINE TESTING W REFLEX): HIV Screen 4th Generation wRfx: NONREACTIVE

## 2023-03-02 LAB — MAGNESIUM: Magnesium: 2.2 mg/dL (ref 1.7–2.4)

## 2023-03-02 LAB — PROCALCITONIN: Procalcitonin: 0.12 ng/mL

## 2023-03-02 MED ORDER — DOXYCYCLINE HYCLATE 100 MG PO TABS
100.0000 mg | ORAL_TABLET | Freq: Two times a day (BID) | ORAL | Status: DC
  Administered 2023-03-02 – 2023-03-04 (×4): 100 mg via ORAL
  Filled 2023-03-02 (×4): qty 1

## 2023-03-02 MED ORDER — POTASSIUM CHLORIDE 10 MEQ/100ML IV SOLN
10.0000 meq | Freq: Once | INTRAVENOUS | Status: AC
Start: 2023-03-02 — End: 2023-03-02
  Administered 2023-03-02: 10 meq via INTRAVENOUS
  Filled 2023-03-02: qty 100

## 2023-03-02 MED ORDER — LABETALOL HCL 5 MG/ML IV SOLN
20.0000 mg | INTRAVENOUS | Status: DC | PRN
  Administered 2023-03-02: 20 mg via INTRAVENOUS
  Filled 2023-03-02: qty 4

## 2023-03-02 MED ORDER — CLEVIDIPINE BUTYRATE 0.5 MG/ML IV EMUL
0.0000 mg/h | INTRAVENOUS | Status: DC
  Administered 2023-03-02: 2 mg/h via INTRAVENOUS
  Administered 2023-03-03: 3 mg/h via INTRAVENOUS
  Filled 2023-03-02 (×2): qty 100

## 2023-03-02 MED ORDER — LABETALOL HCL 5 MG/ML IV SOLN
10.0000 mg | INTRAVENOUS | Status: DC | PRN
Start: 2023-03-02 — End: 2023-03-02
  Administered 2023-03-02: 10 mg via INTRAVENOUS
  Filled 2023-03-02: qty 4

## 2023-03-02 MED ORDER — METHYLPREDNISOLONE SODIUM SUCC 125 MG IJ SOLR
125.0000 mg | Freq: Once | INTRAMUSCULAR | Status: AC
  Administered 2023-03-02: 125 mg via INTRAVENOUS
  Filled 2023-03-02: qty 2

## 2023-03-02 MED ORDER — FUROSEMIDE 10 MG/ML IJ SOLN
60.0000 mg | Freq: Once | INTRAMUSCULAR | Status: AC
  Administered 2023-03-02: 60 mg via INTRAVENOUS
  Filled 2023-03-02: qty 6

## 2023-03-02 MED ORDER — IPRATROPIUM-ALBUTEROL 0.5-2.5 (3) MG/3ML IN SOLN
3.0000 mL | Freq: Four times a day (QID) | RESPIRATORY_TRACT | Status: DC
  Administered 2023-03-02 – 2023-03-04 (×9): 3 mL via RESPIRATORY_TRACT
  Filled 2023-03-02 (×9): qty 3

## 2023-03-02 MED ORDER — HYDRALAZINE HCL 20 MG/ML IJ SOLN
10.0000 mg | Freq: Four times a day (QID) | INTRAMUSCULAR | Status: DC | PRN
  Administered 2023-03-03 – 2023-03-04 (×2): 10 mg via INTRAVENOUS
  Filled 2023-03-02 (×2): qty 1

## 2023-03-02 MED ORDER — TRIAMTERENE-HCTZ 37.5-25 MG PO TABS
1.0000 | ORAL_TABLET | Freq: Every day | ORAL | Status: DC
  Administered 2023-03-02 (×2): 1 via ORAL
  Filled 2023-03-02 (×2): qty 1

## 2023-03-02 MED ORDER — CHLORHEXIDINE GLUCONATE CLOTH 2 % EX PADS
6.0000 | MEDICATED_PAD | Freq: Every day | CUTANEOUS | Status: DC
  Administered 2023-03-02 – 2023-03-04 (×3): 6 via TOPICAL

## 2023-03-02 MED ORDER — METHYLPREDNISOLONE SODIUM SUCC 40 MG IJ SOLR
40.0000 mg | Freq: Two times a day (BID) | INTRAMUSCULAR | Status: DC
  Administered 2023-03-03 (×2): 40 mg via INTRAVENOUS
  Filled 2023-03-02 (×2): qty 1

## 2023-03-02 MED ORDER — LABETALOL HCL 5 MG/ML IV SOLN: 10.0000 mg | Freq: Once | INTRAVENOUS | Status: DC

## 2023-03-02 MED ORDER — AMLODIPINE BESYLATE 10 MG PO TABS
10.0000 mg | ORAL_TABLET | Freq: Every day | ORAL | Status: DC
  Administered 2023-03-02 – 2023-03-04 (×4): 10 mg via ORAL
  Filled 2023-03-02 (×2): qty 2
  Filled 2023-03-02 (×2): qty 1

## 2023-03-02 MED ORDER — INSULIN ASPART 100 UNIT/ML IJ SOLN
0.0000 [IU] | INTRAMUSCULAR | Status: DC
  Administered 2023-03-02: 11 [IU] via SUBCUTANEOUS
  Administered 2023-03-02: 5 [IU] via SUBCUTANEOUS
  Administered 2023-03-03 (×2): 3 [IU] via SUBCUTANEOUS

## 2023-03-02 MED ORDER — NITROGLYCERIN IN D5W 200-5 MCG/ML-% IV SOLN
0.0000 ug/min | INTRAVENOUS | Status: DC
  Administered 2023-03-02: 55 ug/min via INTRAVENOUS
  Administered 2023-03-02: 5 ug/min via INTRAVENOUS
  Filled 2023-03-02 (×2): qty 250

## 2023-03-02 NOTE — Assessment & Plan Note (Signed)
Patient is required escalating levels of oxygen overnight.  Was on room air 94% on admission, required 4 L x 3 a.m., and this morning required nonrebreather.  Nursing attempted during the day today to wean down to 4 L, but patient desaturates to 86%.  Chest x-ray has remained clear.  She has new wheezing, but no history of COPD or asthma.  No chest pain to suggest PE. -Start Solu-Medrol - Scheduled bronchodilators - Consult critical care/pulmonology

## 2023-03-02 NOTE — ED Notes (Signed)
This RN attempted to give transfer of care report to IP RN, no answer.

## 2023-03-02 NOTE — Hospital Course (Addendum)
52 y.o. F with HTN untreated who presented with SOB and cough, found to have hypertensive emergency,?CHF and wheezing.

## 2023-03-02 NOTE — Assessment & Plan Note (Signed)
BP >220/120 on admission.  Home amlodipine, triamterene, HCTZ restarted, no improvement, so nitroglycerin drip started.  Through the day today, we have titrated the nitroglycerin up to 60 mcg/min and still BP > 170/120 - Consult CCM for ICU

## 2023-03-02 NOTE — ED Notes (Signed)
Admit MD at bedside at this time.

## 2023-03-02 NOTE — ED Notes (Signed)
ED TO INPATIENT HANDOFF REPORT  ED Nurse Name and Phone #: Osvaldo Shipper RN [phone]  S Name/Age/Gender [name] 52 y.o. female Room/Bed: 006C/006C  Code Status   Code Status: Full Code  Home/SNF/Other Home Patient oriented to: self, place, time, and situation Is this baseline? Yes   Triage Complete: Triage complete  Chief Complaint Hypertensive crisis [I16.9]  Triage Note Patient BIB EMS for evaluation of SHOB.  Patient reports symptoms started yesterday.  Has had a productive cough.  No reports of fevers.  EMS reports + wheezing and increased respiratory rate on arrival.  Given a total of Albuterol 10 mg neb prior to arrival with improvement in symptoms.  Hypertensive on arrival.   Hx of HTN.  Has not taken medication x several days.    Allergies No Known Allergies  Level of Care/Admitting Diagnosis ED Disposition     ED Disposition  Admit   Condition  --   Comment  Hospital Area: [facility] [[id number]]  Level of Care: Progressive [102]  Admit to Progressive based on following criteria: CARDIOVASCULAR & THORACIC of moderate stability with acute coronary syndrome symptoms/low risk myocardial infarction/hypertensive urgency/arrhythmias/heart failure potentially compromising stability and stable post cardiovascular intervention patients.  May place patient in observation at [facility] or [facility] if equivalent level of care is available:: Yes  Covid Evaluation: Confirmed COVID Negative  Diagnosis: Hypertensive crisis [244010]  Admitting Physician: Briscoe Deutscher [[id number]]  Attending Physician: Briscoe Deutscher [[id number]]          B Medical/Surgery History Past Medical History:  Diagnosis Date   Blood transfusion without reported diagnosis    GERD (gastroesophageal reflux disease)    Hypertension    Multinodular goiter    Past Surgical History:  Procedure Laterality Date   ENDOMETRIAL ABLATION       A IV  Location/Drains/Wounds Patient Lines/Drains/Airways Status     Active Line/Drains/Airways     Name Placement date Placement time Site Days   Peripheral IV 03/02/23 20 G Posterior;Right Hand 03/02/23  0956  Hand  less than 1            Intake/Output Last 24 hours  Intake/Output Summary (Last 24 hours) at 03/02/2023 1008 Last data filed at 03/02/2023 0011 Gross per 24 hour  Intake 3 ml  Output --  Net 3 ml    Labs/Imaging Results for orders placed or performed during the hospital encounter of 03/01/23 (from the past 48 hour(s))  Brain natriuretic peptide     Status: Abnormal   Collection Time: 03/01/23 10:08 PM  Result Value Ref Range   B Natriuretic Peptide 107.1 (H) 0.0 - 100.0 pg/mL    Comment: Performed at [facility], [address] [place], [place] [address]  Basic metabolic panel     Status: Abnormal   Collection Time: 03/01/23 10:08 PM  Result Value Ref Range   Sodium 138 135 - 145 mmol/L   Potassium 2.8 (L) 3.5 - 5.1 mmol/L   Chloride 100 98 - 111 mmol/L   CO2 23 22 - 32 mmol/L   Glucose, Bld 183 (H) 70 - 99 mg/dL    Comment: Glucose reference range applies only to samples taken after fasting for at least 8 hours.   BUN 10 6 - 20 mg/dL   Creatinine, Ser 3.87 0.44 - 1.00 mg/dL   Calcium 9.1 8.9 - 56.4 mg/dL   GFR, Estimated >33 >29 mL/min    Comment: (NOTE) Calculated using the CKD-EPI Creatinine  Equation (2021)    Anion gap 15 5 - 15    Comment: Performed at [facility], [address] [place], [place] [address]  Troponin I (High Sensitivity)     Status: Abnormal   Collection Time: 03/01/23 10:08 PM  Result Value Ref Range   Troponin I (High Sensitivity) 33 (H) <18 ng/L    Comment: (NOTE) Elevated high sensitivity troponin I (hsTnI) values and significant  changes across serial measurements may suggest ACS but many other  chronic and acute conditions are known to elevate hsTnI results.  Refer to the "Links" section for chest pain  algorithms and additional  guidance. Performed at [facility], [address], [place], [place] [address]   CBC with Differential     Status: None   Collection Time: 03/01/23 10:08 PM  Result Value Ref Range   WBC 8.8 4.0 - 10.5 K/uL   RBC 4.34 3.87 - 5.11 MIL/uL   Hemoglobin 13.7 12.0 - 15.0 g/dL   HCT 09.8 11.9 - 14.7 %   MCV 96.8 80.0 - 100.0 fL   MCH 31.6 26.0 - 34.0 pg   MCHC 32.6 30.0 - 36.0 g/dL   RDW 82.9 56.2 - 13.0 %   Platelets 168 150 - 400 K/uL   nRBC 0.0 0.0 - 0.2 %   Neutrophils Relative % 75 %   Neutro Abs 6.7 1.7 - 7.7 K/uL   Lymphocytes Relative 13 %   Lymphs Abs 1.1 0.7 - 4.0 K/uL   Monocytes Relative 8 %   Monocytes Absolute 0.7 0.1 - 1.0 K/uL   Eosinophils Relative 2 %   Eosinophils Absolute 0.1 0.0 - 0.5 K/uL   Basophils Relative 1 %   Basophils Absolute 0.1 0.0 - 0.1 K/uL   Immature Granulocytes 1 %   Abs Immature Granulocytes 0.04 0.00 - 0.07 K/uL    Comment: Performed at [facility], [address], [place], [place] [address]  Resp panel by RT-PCR (RSV, Flu A&B, Covid) Anterior Nasal Swab     Status: None   Collection Time: 03/01/23 10:08 PM   Specimen: Anterior Nasal Swab  Result Value Ref Range   SARS Coronavirus 2 by RT PCR NEGATIVE NEGATIVE   Influenza A by PCR NEGATIVE NEGATIVE   Influenza B by PCR NEGATIVE NEGATIVE    Comment: (NOTE) The Xpert Xpress SARS-CoV-2/FLU/RSV plus assay is intended as an aid in the diagnosis of influenza from Nasopharyngeal swab specimens and should not be used as a sole basis for treatment. Nasal washings and aspirates are unacceptable for Xpert Xpress SARS-CoV-2/FLU/RSV testing.  Fact Sheet for Patients: BloggerCourse.com  Fact Sheet for Healthcare Providers: SeriousBroker.it  This test is not yet approved or cleared by the [place] FDA and has been authorized for detection and/or diagnosis of SARS-CoV-2 by FDA under an Emergency Use  Authorization (EUA). This EUA will remain in effect (meaning this test can be used) for the duration of the COVID-19 declaration under Section 564(b)(1) of the Act, 21 U.S.C. section 360bbb-3(b)(1), unless the authorization is terminated or revoked.     Resp Syncytial Virus by PCR NEGATIVE NEGATIVE    Comment: (NOTE) Fact Sheet for Patients: BloggerCourse.com  Fact Sheet for Healthcare Providers: SeriousBroker.it  This test is not yet approved or cleared by the [place] FDA and has been authorized for detection and/or diagnosis of SARS-CoV-2 by FDA under an Emergency Use Authorization (EUA). This EUA will remain in effect (meaning this test can be used)  for the duration of the COVID-19 declaration under Section 564(b)(1) of the Act, 21 U.S.C. section 360bbb-3(b)(1), unless the authorization is terminated or revoked.  Performed at [facility], [address], [place], [place] [address]   Troponin I (High Sensitivity)     Status: Abnormal   Collection Time: 03/01/23 11:48 PM  Result Value Ref Range   Troponin I (High Sensitivity) 51 (H) <18 ng/L    Comment: (NOTE) Elevated high sensitivity troponin I (hsTnI) values and significant  changes across serial measurements may suggest ACS but many other  chronic and acute conditions are known to elevate hsTnI results.  Refer to the "Links" section for chest pain algorithms and additional  guidance. Performed at [facility], [address] [place], [place] [address]   HIV Antibody (routine testing w rflx)     Status: None   Collection Time: 03/02/23  3:23 AM  Result Value Ref Range   HIV Screen 4th Generation wRfx Non Reactive Non Reactive    Comment: Performed at [facility], [address] [place], [place] [address]  Basic metabolic panel     Status: Abnormal   Collection Time: 03/02/23  3:23 AM  Result Value Ref Range   Sodium 136 135 - 145 mmol/L    Potassium 4.2 3.5 - 5.1 mmol/L   Chloride 101 98 - 111 mmol/L   CO2 24 22 - 32 mmol/L   Glucose, Bld 151 (H) 70 - 99 mg/dL    Comment: Glucose reference range applies only to samples taken after fasting for at least 8 hours.   BUN 8 6 - 20 mg/dL   Creatinine, Ser 9.14 0.44 - 1.00 mg/dL   Calcium 9.4 8.9 - 78.2 mg/dL   GFR, Estimated >95 >62 mL/min    Comment: (NOTE) Calculated using the CKD-EPI Creatinine Equation (2021)    Anion gap 11 5 - 15    Comment: Performed at [facility], [address] [place], [place] [address]  Magnesium     Status: None   Collection Time: 03/02/23  3:23 AM  Result Value Ref Range   Magnesium 2.2 1.7 - 2.4 mg/dL    Comment: Performed at [facility], [address] [place], [place] [address]  CBC     Status: Abnormal   Collection Time: 03/02/23  3:23 AM  Result Value Ref Range   WBC 10.8 (H) 4.0 - 10.5 K/uL   RBC 4.57 3.87 - 5.11 MIL/uL   Hemoglobin 14.6 12.0 - 15.0 g/dL   HCT 96.2 95.2 - 84.1 %   MCV 97.4 80.0 - 100.0 fL   MCH 31.9 26.0 - 34.0 pg   MCHC 32.8 30.0 - 36.0 g/dL   RDW 32.4 40.1 - 02.7 %   Platelets 190 150 - 400 K/uL   nRBC 0.0 0.0 - 0.2 %    Comment: Performed at [facility], [address], [place], [place] [address]  Troponin I (High Sensitivity)     Status: Abnormal   Collection Time: 03/02/23  3:23 AM  Result Value Ref Range   Troponin I (High Sensitivity) 70 (H) <18 ng/L    Comment: RESULT CALLED TO, READ BACK BY AND VERIFIED WITH BOYD,R PARAMEDIC [phone] 03/02/23 AMIREHSANI F (NOTE) Elevated high sensitivity troponin I (hsTnI) values and significant  changes across serial measurements may suggest ACS but many other  chronic and acute conditions are known to elevate hsTnI results.  Refer to the "Links"  section for chest pain algorithms and additional  guidance. Performed at [facility], [address], [place], [place] [address]    DG CHEST PORT 1 VIEW  Result Date: 03/02/2023 CLINICAL DATA:   Cough and wheezing EXAM: PORTABLE CHEST 1 VIEW COMPARISON:  Same day chest radiograph FINDINGS: The cardiomediastinal silhouette is stable with unchanged mild cardiomegaly. The lungs are stable, with no new focal airspace disease since the study obtained earlier the same day. There is no pulmonary edema. There is no pleural effusion or pneumothorax There is no acute osseous abnormality. IMPRESSION: Stable chest. Electronically Signed   By: Lesia Hausen M.D.   On: 03/02/2023 09:04   DG CHEST PORT 1 VIEW  Result Date: 03/02/2023 CLINICAL DATA:  [id number] with hypoxic respiratory failure. EXAM: PORTABLE CHEST 1 VIEW COMPARISON:  Portable chest yesterday at 9:29 p.m. FINDINGS: 4:38 a.m. There is mild cardiomegaly but no findings of acute CHF. The mediastinum is normally outlined. The lungs are clear. Thoracic spondylosis. Stable chest. IMPRESSION: No evidence of acute chest disease. Mild cardiomegaly. Electronically Signed   By: Almira Bar M.D.   On: 03/02/2023 05:04   DG Chest Portable 1 View  Result Date: 03/01/2023 CLINICAL DATA:  Shortness of breath EXAM: PORTABLE CHEST 1 VIEW COMPARISON:  09/10/2020 FINDINGS: The heart size and mediastinal contours are within normal limits. Both lungs are clear. The visualized skeletal structures are unremarkable. IMPRESSION: No active disease. Electronically Signed   By: Jasmine Pang M.D.   On: 03/01/2023 21:37    Pending Labs Unresulted Labs (From admission, onward)     Start     Ordered   03/08/23 0500  Creatinine, serum  (enoxaparin (LOVENOX)    CrCl >/= 30 ml/min)  Weekly,   R     Comments: while on enoxaparin therapy    03/01/23 2346   03/02/23 0500  Basic metabolic panel  Daily,   R      03/01/23 2346   03/02/23 0500  CBC  Daily,   R      03/01/23 2346            Vitals/Pain Today's Vitals   03/02/23 0810 03/02/23 0825 03/02/23 0943 03/02/23 1000  BP: (!) 147/98 (!) 170/97 (!) 211/128 (!) 165/114  Pulse: 85 86  89  Resp: (!) 22 (!) 31  (!)  30  Temp:    97.9 F (36.6 C)  TempSrc:    Oral  SpO2: 93% 94%  93%  PainSc:        Isolation Precautions No active isolations  Medications Medications  potassium chloride SA (KLOR-CON M) CR tablet 20 mEq (20 mEq Oral Given 03/02/23 0943)  enoxaparin (LOVENOX) injection 40 mg (40 mg Subcutaneous Given 03/02/23 0944)  sodium chloride flush (NS) 0.9 % injection 3 mL (3 mLs Intravenous Given 03/02/23 0011)  acetaminophen (TYLENOL) tablet 650 mg (650 mg Oral Given 03/02/23 0008)    Or  acetaminophen (TYLENOL) suppository 650 mg ( Rectal See Alternative 03/02/23 0008)  oxyCODONE (Oxy IR/ROXICODONE) immediate release tablet 5 mg (has no administration in time range)  senna-docusate (Senokot-S) tablet 1 tablet (has no administration in time range)  albuterol (PROVENTIL) (2.5 MG/3ML) 0.083% nebulizer solution 2.5 mg (2.5 mg Nebulization Given 03/02/23 0729)  amLODipine (NORVASC) tablet 10 mg (10 mg Oral Given 03/02/23 0943)  triamterene-hydrochlorothiazide (MAXZIDE-25) 37.5-25 MG per tablet 1 tablet (1 tablet Oral Given 03/02/23 0943)  nitroGLYCERIN 50 mg in dextrose 5 % 250 mL (0.2 mg/mL) infusion (55 mcg/min Intravenous  New Bag/Given 03/02/23 0745)  ipratropium-albuterol (DUONEB) 0.5-2.5 (3) MG/3ML nebulizer solution 3 mL (3 mLs Nebulization Given 03/02/23 0944)  labetalol (NORMODYNE) injection 20 mg (20 mg Intravenous Given 03/02/23 0009)  potassium chloride SA (KLOR-CON M) CR tablet 40 mEq (40 mEq Oral Given 03/02/23 0043)  magnesium sulfate IVPB 1 g 100 mL (0 g Intravenous Stopped 03/02/23 0204)  potassium chloride 10 mEq in 100 mL IVPB (0 mEq Intravenous Stopped 03/02/23 0328)  furosemide (LASIX) injection 60 mg (60 mg Intravenous Given 03/02/23 0434)  methylPREDNISolone sodium succinate (SOLU-MEDROL) 125 mg/2 mL injection 125 mg (125 mg Intravenous Given 03/02/23 0854)    Mobility walks     Focused Assessments Cardiac Assessment Handoff:    Lab Results  Component Value Date   CKTOTAL 166  07/10/2010   CKMB 0.9 07/10/2010   TROPONINI <0.30 10/18/2013   Lab Results  Component Value Date   DDIMER <0.27 06/25/2016   Does the Patient currently have chest pain? No   , Pulmonary Assessment Handoff:  Lung sounds: Bilateral Breath Sounds: Expiratory wheezes, Coarse crackles L Breath Sounds: Expiratory wheezes R Breath Sounds: Expiratory wheezes O2 Device: NRB O2 Flow Rate (L/min): 10 L/min    R Recommendations: See Admitting Provider Note  Report given to:   Additional Notes: SOB at rest, can stand to use bedside commode

## 2023-03-02 NOTE — ED Notes (Signed)
This RN called RT, who suggested NRB.

## 2023-03-02 NOTE — ED Notes (Signed)
IP provider notified of breathing effort and at bedside.

## 2023-03-02 NOTE — Assessment & Plan Note (Signed)
No chest pain to suggest ischemia.

## 2023-03-02 NOTE — Consult Note (Addendum)
NAME:  [name], MRN:  [id number], DOB:  [birth year], LOS: 0 ADMISSION DATE:  03/01/2023, CONSULTATION DATE:  03/02/2023 REFERRING MD:  Dr. Maryfrances Bunnell - [facility], CHIEF COMPLAINT:  SOB with productive cough    History of Present Illness:  [name] is a 52 y.o. with past medical history significant for longstanding uncontrolled hypertension, goiter, and GERD presented to the ED for chief complaint of shortness of breath and cough.  States symptoms have been ongoing for several weeks with progressive worsening over the last few days prompting ED evaluation.  On ED arrival patient was seen tachypneic, tachycardic, and significantly hypotensive with BP 224/124 with MAP of 152.Marland Kitchen  Patient was admitted per hospitalist service with initiation of nitroglycerin drip for hypertensive emergency.  Unfortunately while in the emergency department patient's oxygen requirement has increased with persistent hypertension despite nitroglycerin drip prompting PCCM consult.  Pertinent  Medical History  Uncontrolled hypertension, goiter, and GERD   Significant Hospital Events: Including procedures, antibiotic start and stop dates in addition to other pertinent events   11/3 presented to the ED with complaints of shortness of breath and cough admitted for hypertensive Vincent 11/4 CCM consulted for persistent hypertensive urgency with increasing supplemental oxygen requirement  Interim History / Subjective:  As above   Objective   Blood pressure (!) 150/100, pulse 92, temperature 97.9 F (36.6 C), temperature source Oral, resp. rate (!) 34, last menstrual period 12/04/2018, SpO2 93%.    FiO2 (%):  [100 %] 100 %   Intake/Output Summary (Last 24 hours) at 03/02/2023 1552 Last data filed at 03/02/2023 1400 Gross per 24 hour  Intake 120.29 ml  Output --  Net 120.29 ml   There were no vitals filed for this visit.  Examination: General acute on chronic ill-appearing middle-aged female sitting up in bed in no acute  distress HEENT: [place]/AT, MM pink/moist, PERRL,  Neuro: Alert and oriented x 3, nonfocal CV: Tachycardia, s1s2 regular rate and rhythm, no murmur, rubs, or gallops,  PULM: Slightly diminished bilaterally, slight increased work of breathing, mild tachypnea GI: soft, bowel sounds active in all 4 quadrants, non-tender, non-distended Extremities: warm/dry, no edema  Skin: no rashes or lesions  Resolved Hospital Problem list     Assessment & Plan:  Hypertensive urgency with history of longstanding uncontrolled hypertension -Patient presented with SBP greater than 200 on admission with MAP of 152 -Patient reports poor compliance with oral medications with often missed doses P: Start Cleviprex drip Wean nitroglycerin off Continuous telemetry Close monitoring in the ICU setting SBP goal 140-160 with MAP less than 110 Obtain echocardiogram Continue home amlodipine Hold home HCTZ in favor of Lasix  HFpEF -Most recent echo August 2020 with preserved EF of 60 to 65% with severely increased LV wall thickening with impaired relaxation and severely dilated left atrium P: Continuous telemetry  Heart health diet with sodium restriction when able  Strict intake and output  Daily weight to assess volume status Daily assessment for need to diurese  Closely monitor renal function and electrolytes  Repeat echo as above GDMT as able  Elevated troponin -Likely secondary to hypertensive urgency P: Trend until plateau seen  Acute hypoxic respiratory failure -Concern for underlying pulmonary edema however chest x-ray remains clear P: Collect a sputum culture Trend CBC and fever curve Continue supplemental oxygen, wean as able Encourage pulmonary hygiene  Check procal  Empiric Doxy  IV steroids   Hyperglycemia P: Check hemoglobin A1c As needed SSI  Best Practice (right click and "  Reselect all SmartList Selections" daily)   Diet/type: Regular consistency (see orders) DVT prophylaxis:  LMWH GI prophylaxis: N/A and PPI Lines: N/A Foley:  N/A Code Status:  full code Last date of multidisciplinary goals of care discussion: Continue ongoing aggressive interventions, update patient daily  Labs   CBC: Recent Labs  Lab 03/01/23 2208 03/02/23 0323  WBC 8.8 10.8*  NEUTROABS 6.7  --   HGB 13.7 14.6  HCT 42.0 44.5  MCV 96.8 97.4  PLT 168 190    Basic Metabolic Panel: Recent Labs  Lab 03/01/23 2208 03/02/23 0323  NA 138 136  K 2.8* 4.2  CL 100 101  CO2 23 24  GLUCOSE 183* 151*  BUN 10 8  CREATININE 0.93 0.82  CALCIUM 9.1 9.4  MG  --  2.2   GFR: CrCl cannot be calculated (Unknown ideal weight.). Recent Labs  Lab 03/01/23 2208 03/02/23 0323  WBC 8.8 10.8*    Liver Function Tests: No results for input(s): "AST", "ALT", "ALKPHOS", "BILITOT", "PROT", "ALBUMIN" in the last 168 hours. No results for input(s): "LIPASE", "AMYLASE" in the last 168 hours. No results for input(s): "AMMONIA" in the last 168 hours.  ABG    Component Value Date/Time   TCO2 23 04/25/2014 0917     Coagulation Profile: No results for input(s): "INR", "PROTIME" in the last 168 hours.  Cardiac Enzymes: No results for input(s): "CKTOTAL", "CKMB", "CKMBINDEX", "TROPONINI" in the last 168 hours.  HbA1C: Hemoglobin A1C  Date/Time Value Ref Range Status  07/29/2012 09:24 AM 4.8  Final   Hgb A1c MFr Bld  Date/Time Value Ref Range Status  12/14/2018 07:42 AM 5.3 4.8 - 5.6 % Final    Comment:    (NOTE) Pre diabetes:          5.7%-6.4% Diabetes:              >6.4% Glycemic control for   <7.0% adults with diabetes   07/23/2007 01:30 PM   Final   5.8 (NOTE)   The ADA recommends the following therapeutic goals for glycemic   control related to Hgb A1C measurement:   Goal of Therapy:   < 7.0% Hgb A1C   Action Suggested:  > 8.0% Hgb A1C   Ref:  Diabetes Care, 22, Suppl. 1, 1999    CBG: No results for input(s): "GLUCAP" in the last 168 hours.  Review of Systems:   Please see  the history of present illness. All other systems reviewed and are negative    Past Medical History:  She,  has a past medical history of Blood transfusion without reported diagnosis, GERD (gastroesophageal reflux disease), Hypertension, and Multinodular goiter.   Surgical History:   Past Surgical History:  Procedure Laterality Date   ENDOMETRIAL ABLATION       Social History:   reports that she has never smoked. She has never used smokeless tobacco. She reports current alcohol use of about 10.0 standard drinks of alcohol per week. She reports that she does not use drugs.   Family History:  Her family history includes Asthma in her daughter; Cancer in her paternal aunt; Diabetes in her father, maternal grandmother, paternal grandmother, sister, and sister; Hypertension in her brother, father, maternal grandmother, mother, paternal grandmother, sister, and son.   Allergies No Known Allergies   Home Medications  Prior to Admission medications   Medication Sig Start Date End Date Taking? Authorizing Provider  amLODipine (NORVASC) 10 MG tablet Take 1 tablet (10 mg total) by mouth daily. 09/10/20  Yes Pricilla Loveless, MD  aspirin EC 81 MG tablet Take 81 mg by mouth daily.   Yes [provider]  potassium chloride SA (KLOR-CON) 20 MEQ tablet Take 1 tablet (20 mEq total) by mouth daily. 09/10/20  Yes Pricilla Loveless, MD  tetrahydrozoline-zinc (VISINE-AC) 0.05-0.25 % ophthalmic solution 2 drops 3 (three) times daily as needed.   Yes [provider]  triamterene-hydrochlorothiazide (DYAZIDE) 37.5-25 MG capsule Take 1 each (1 capsule total) by mouth daily. 09/10/20  Yes Pricilla Loveless, MD  Blood Pressure KIT 1 each by Does not apply route daily after breakfast. 12/14/18   Azucena Fallen, MD     Critical care time:   CRITICAL CARE Performed by: Fard Borunda D. Harris   Total critical care time: 40 minutes  Critical care time was exclusive of separately billable procedures  and treating other patients.  Critical care was necessary to treat or prevent imminent or life-threatening deterioration.  Critical care was time spent personally by me on the following activities: development of treatment plan with patient and/or surrogate as well as nursing, discussions with consultants, evaluation of patient's response to treatment, examination of patient, obtaining history from patient or surrogate, ordering and performing treatments and interventions, ordering and review of laboratory studies, ordering and review of radiographic studies, pulse oximetry and re-evaluation of patient's condition.  Maudie Shingledecker D. Harris, NP-C [facility] Personal contact information can be found on Amion  If no contact or response made please call [phone] 03/02/2023, 4:24 PM

## 2023-03-02 NOTE — Assessment & Plan Note (Signed)
Chest x-ray shows cephalization of vessels, BNP is slightly elevated.  She does not have significant peripheral edema, she does not have frank pulmonary edema on chest x-ray.  Not improved with Lasix this morning. - Further Lasix per critical care - Obtain echocardiogram

## 2023-03-02 NOTE — Assessment & Plan Note (Signed)
-   Supplemented and resolved 

## 2023-03-02 NOTE — Progress Notes (Addendum)
  Progress Note   Patient: [name] ZDG:[id number] DOB: 1970-05-17 DOA: 03/01/2023     0 DOS: the patient was seen and examined on 03/02/2023 at 8:05 AM, 12:30 PM, 2:40 PM, and 3:40 PM      Brief hospital course: 52 y.o. F with HTN untreated who presented with SOB and cough, found to have hypertensive emergency and CHF.     Assessment and Plan: * Hypertensive crisis BP >220/120 on admission.  Home amlodipine, triamterene, HCTZ restarted, no improvement, so nitroglycerin drip started.  Through the day today, we have titrated the nitroglycerin up to 60 mcg/min and still BP > 170/120 - Consult CCM for ICU    Acute on chronic heart failure with preserved ejection fraction (HFpEF) (HCC) Chest x-ray shows cephalization of vessels, BNP is slightly elevated.  She does not have significant peripheral edema, she does not have frank pulmonary edema on chest x-ray.  Not improved with Lasix this morning. - Further Lasix per critical care - Obtain echocardiogram   Hypokalemia Supplemented and resolved  Elevated troponin No chest pain to suggest ischemia.  Acute respiratory failure with hypoxia (HCC) Patient is required escalating levels of oxygen overnight.  Was on room air 94% on admission, required 4 L x 3 a.m., and this morning required nonrebreather.  Nursing attempted during the day today to wean down to 4 L, but patient desaturates to 86%.  Chest x-ray has remained clear.  She has new wheezing, but no history of COPD or asthma.  No chest pain to suggest PE. -Start Solu-Medrol - Scheduled bronchodilators - Consult critical care/pulmonology          Subjective: Patient still feels short of breath, she has chest tightness which is not relieved with nitro, but is relieved with albuterol.  She has had no fever, no sputum.     Physical Exam: BP (!) 150/100   Pulse 92   Temp 97.9 F (36.6 C) (Oral)   Resp (!) 34   LMP 12/04/2018   SpO2 93%   Obese adult female,  lying in bed, interactive and appropriate, appears short of breath Tachycardic, regular, no murmurs, JVP not visible, no pitting edema in the extremities Respiratory rate very fast, no accessory muscle use, wheezing bilaterally with expiration, no rales Abdomen soft no tenderness palpation or guarding Attention normal, affect anxious, judgment and insight appear normal, oriented to person, place, time, generally weak but symmetric     Data Reviewed: Discussed with critical care Troponin low and flat CBC shows no significant abnormality Magnesium normal Basic metabolic panel shows normal renal function electrolytes BNP slightly elevated  Family Communication: None present    Disposition: Status is: Inpatient Admit to ICU    The patient is critically ill with multi-organ failure.  Critical care was necessary to treat or prevent imminent or life-threatening deterioration of respiratory failure, cardiac failure and was exclusive of separately billable procedures and treating other patients. Total critical care time spent by me: 70 minutes Time spent personally by me on obtaining history from patient or surrogate, evaluation of the patient, evaluation of patient's response to treatment, ordering and review of laboratory studies, ordering and review of radiographic studies, ordering and performing treatments and interventions, and re-evaluation of the patient's condition.     Author: Alberteen Sam, MD 03/02/2023 3:40 PM  For on call review [email or web address].

## 2023-03-02 NOTE — ED Notes (Signed)
X-ray at bedside

## 2023-03-03 ENCOUNTER — Inpatient Hospital Stay (HOSPITAL_COMMUNITY): Payer: Self-pay

## 2023-03-03 DIAGNOSIS — R0609 Other forms of dyspnea: Secondary | ICD-10-CM

## 2023-03-03 LAB — CBC
HCT: 43.7 % (ref 36.0–46.0)
Hemoglobin: 14.3 g/dL (ref 12.0–15.0)
MCH: 31.9 pg (ref 26.0–34.0)
MCHC: 32.7 g/dL (ref 30.0–36.0)
MCV: 97.5 fL (ref 80.0–100.0)
Platelets: 178 10*3/uL (ref 150–400)
RBC: 4.48 MIL/uL (ref 3.87–5.11)
RDW: 13.4 % (ref 11.5–15.5)
WBC: 11.5 10*3/uL — ABNORMAL HIGH (ref 4.0–10.5)
nRBC: 0 % (ref 0.0–0.2)

## 2023-03-03 LAB — ECHOCARDIOGRAM COMPLETE
AR max vel: 2.57 cm2
AV Area VTI: 2.52 cm2
AV Area mean vel: 2.39 cm2
AV Mean grad: 3 mm[Hg]
AV Peak grad: 5.3 mm[Hg]
Ao pk vel: 1.16 m/s
Area-P 1/2: 5.66 cm2
S' Lateral: 3.05 cm
Weight: 3142.88 [oz_av]

## 2023-03-03 LAB — BASIC METABOLIC PANEL
Anion gap: 13 (ref 5–15)
BUN: 23 mg/dL — ABNORMAL HIGH (ref 6–20)
CO2: 23 mmol/L (ref 22–32)
Calcium: 9.6 mg/dL (ref 8.9–10.3)
Chloride: 98 mmol/L (ref 98–111)
Creatinine, Ser: 1.23 mg/dL — ABNORMAL HIGH (ref 0.44–1.00)
GFR, Estimated: 53 mL/min — ABNORMAL LOW (ref >60)
Glucose, Bld: 193 mg/dL — ABNORMAL HIGH (ref 70–99)
Potassium: 4.2 mmol/L (ref 3.5–5.1)
Sodium: 134 mmol/L — ABNORMAL LOW (ref 135–145)

## 2023-03-03 LAB — GLUCOSE, CAPILLARY
Glucose-Capillary: 192 mg/dL — ABNORMAL HIGH (ref 70–99)
Glucose-Capillary: 194 mg/dL — ABNORMAL HIGH (ref 70–99)
Glucose-Capillary: 162 mg/dL — ABNORMAL HIGH (ref 70–99)
Glucose-Capillary: 314 mg/dL — ABNORMAL HIGH (ref 70–99)
Glucose-Capillary: 196 mg/dL — ABNORMAL HIGH (ref 70–99)

## 2023-03-03 MED ORDER — INSULIN ASPART 100 UNIT/ML IJ SOLN
0.0000 [IU] | Freq: Three times a day (TID) | INTRAMUSCULAR | Status: DC
Start: 2023-03-03 — End: 2023-03-04
  Administered 2023-03-03: 3 [IU] via SUBCUTANEOUS
  Administered 2023-03-03: 11 [IU] via SUBCUTANEOUS
  Administered 2023-03-04: 3 [IU] via SUBCUTANEOUS

## 2023-03-03 MED ORDER — INSULIN ASPART 100 UNIT/ML IJ SOLN
0.0000 [IU] | Freq: Every day | INTRAMUSCULAR | Status: DC
Start: 2023-03-03 — End: 2023-03-04

## 2023-03-03 MED ORDER — MENTHOL 3 MG MT LOZG
1.0000 | LOZENGE | OROMUCOSAL | Status: DC | PRN
  Administered 2023-03-03 – 2023-03-04 (×2): 3 mg via ORAL
  Filled 2023-03-03: qty 9

## 2023-03-03 NOTE — Progress Notes (Cosign Needed Addendum)
NAME:  [name], MRN:  [id number], DOB:  [birth year], LOS: 1 ADMISSION DATE:  03/01/2023, CONSULTATION DATE:  03/03/2023 REFERRING MD:  Dr. Maryfrances Bunnell - [facility], CHIEF COMPLAINT:  SOB with productive cough   History of Present Illness:  [name] is a 52 y.o. with past medical history significant for longstanding uncontrolled hypertension, goiter, and GERD presented to the ED for chief complaint of shortness of breath and cough.  States symptoms have been ongoing for several weeks with progressive worsening over the last few days prompting ED evaluation.  On ED arrival patient was seen tachypneic, tachycardic, and significantly hypotensive with BP 224/124 with MAP of 152.Marland Kitchen  Patient was admitted per hospitalist service with initiation of nitroglycerin drip for hypertensive emergency.  Unfortunately while in the emergency department patient's oxygen requirement has increased with persistent hypertension despite nitroglycerin drip prompting PCCM consult.   Pertinent  Medical History  -Uncontrolled HTN  Significant Hospital Events: Including procedures, antibiotic start and stop dates in addition to other pertinent events   11/3 presented to the ED with complaints of shortness of breath and cough admitted for hypertensive [place] 11/4 CCM consulted for persistent hypertensive urgency with increasing supplemental oxygen requirement  Interim History / Subjective:  Patient feels well overall, no chest pain Required prn Hydralazine for BP > 180; patient asymptomatic  TTE obtained, pending Patient stable for discharge out of ICU  Objective   Blood pressure (!) 182/110, pulse 93, temperature 98 F (36.7 C), temperature source Oral, resp. rate 18, weight 89.1 kg, last menstrual period 12/04/2018, SpO2 93%.    FiO2 (%):  [100 %] 100 %   Intake/Output Summary (Last 24 hours) at 03/03/2023 1307 Last data filed at 03/03/2023 1000 Gross per 24 hour  Intake 699.13 ml  Output 600 ml  Net 99.13 ml   Filed  Weights   03/02/23 1619 03/03/23 0320  Weight: 89.9 kg 89.1 kg    Physical Exam: Constitutional: well-appearing, lying in bed, in no acute distress Cardiovascular: regular rate and rhythm, no m/r/g, no LEE Pulmonary/Chest: normal work of breathing on room air, lungs clear to auscultation bilaterally Abdominal: soft, non-tender, non-distended MSK: normal bulk and tone Skin: warm and dry Psych: normal mood and behavior   Resolved Hospital Problem list   N/A  Assessment & Plan:  #Hypertensive Urgency -BP improved but continues to be elevated 150-180's/100 -Endorses poor compliance with home Amlodipine and Dyazide -TTE today, results pending  -Continue Amlodipine -Hydralazine q 6 prn for SBP > 180  #Acute Bronchitis -Cough has improved and CXR unremarkable -Continue doxycyline (day 2/5) -Continue Duonebs -D/C solumedrol   Best Practice (right click and "Reselect all SmartList Selections" daily)   Diet/type: Regular consistency (see orders) DVT prophylaxis: LMWH GI prophylaxis: PPI Lines: N/A Foley:  N/A Code Status:  full code Last date of multidisciplinary goals of care discussion [NA]  Labs   CBC: Recent Labs  Lab 03/01/23 2208 03/02/23 0323 03/03/23 0329  WBC 8.8 10.8* 11.5*  NEUTROABS 6.7  --   --   HGB 13.7 14.6 14.3  HCT 42.0 44.5 43.7  MCV 96.8 97.4 97.5  PLT 168 190 178    Basic Metabolic Panel: Recent Labs  Lab 03/01/23 2208 03/02/23 0323 03/03/23 0329  NA 138 136 134*  K 2.8* 4.2 4.2  CL 100 101 98  CO2 23 24 23   GLUCOSE 183* 151* 193*  BUN 10 8 23*  CREATININE 0.93 0.82 1.23*  CALCIUM 9.1 9.4 9.6  MG  --  2.2  --    GFR: CrCl cannot be calculated (Unknown ideal weight.). Recent Labs  Lab 03/01/23 2208 03/02/23 0323 03/03/23 0329  PROCALCITON  --  0.12  --   WBC 8.8 10.8* 11.5*    Liver Function Tests: No results for input(s): "AST", "ALT", "ALKPHOS", "BILITOT", "PROT", "ALBUMIN" in the last 168 hours. No results for  input(s): "LIPASE", "AMYLASE" in the last 168 hours. No results for input(s): "AMMONIA" in the last 168 hours.  ABG    Component Value Date/Time   TCO2 23 04/25/2014 0917     Coagulation Profile: No results for input(s): "INR", "PROTIME" in the last 168 hours.  Cardiac Enzymes: No results for input(s): "CKTOTAL", "CKMB", "CKMBINDEX", "TROPONINI" in the last 168 hours.  HbA1C: Hgb A1c MFr Bld  Date/Time Value Ref Range Status  03/02/2023 03:23 AM 6.1 (H) 4.8 - 5.6 % Final    Comment:    (NOTE) Pre diabetes:          5.7%-6.4%  Diabetes:              >6.4%  Glycemic control for   <7.0% adults with diabetes   12/14/2018 07:42 AM 5.3 4.8 - 5.6 % Final    Comment:    (NOTE) Pre diabetes:          5.7%-6.4% Diabetes:              >6.4% Glycemic control for   <7.0% adults with diabetes     CBG: Recent Labs  Lab 03/02/23 1922 03/02/23 2309 03/03/23 0311 03/03/23 0737 03/03/23 1132  GLUCAP 314* 231* 192* 194* 162*    Review of Systems:   Denies chest pain, cough improved, no fever  Past Medical History:  She,  has a past medical history of Blood transfusion without reported diagnosis, GERD (gastroesophageal reflux disease), Hypertension, and Multinodular goiter.   Surgical History:   Past Surgical History:  Procedure Laterality Date   ENDOMETRIAL ABLATION       Social History:   reports that she has never smoked. She has never used smokeless tobacco. She reports current alcohol use of about 10.0 standard drinks of alcohol per week. She reports that she does not use drugs.   Family History:  Her family history includes Asthma in her daughter; Cancer in her paternal aunt; Diabetes in her father, maternal grandmother, paternal grandmother, sister, and sister; Hypertension in her brother, father, maternal grandmother, mother, paternal grandmother, sister, and son.   Allergies No Known Allergies   Home Medications  Prior to Admission medications   Medication  Sig Start Date End Date Taking? Authorizing Provider  amLODipine (NORVASC) 10 MG tablet Take 1 tablet (10 mg total) by mouth daily. 09/10/20  Yes Pricilla Loveless, MD  aspirin EC 81 MG tablet Take 81 mg by mouth daily.   Yes [provider]  potassium chloride SA (KLOR-CON) 20 MEQ tablet Take 1 tablet (20 mEq total) by mouth daily. 09/10/20  Yes Pricilla Loveless, MD  tetrahydrozoline-zinc (VISINE-AC) 0.05-0.25 % ophthalmic solution 2 drops 3 (three) times daily as needed.   Yes [provider]  triamterene-hydrochlorothiazide (DYAZIDE) 37.5-25 MG capsule Take 1 each (1 capsule total) by mouth daily. 09/10/20  Yes Pricilla Loveless, MD  Blood Pressure KIT 1 each by Does not apply route daily after breakfast. 12/14/18   Azucena Fallen, MD     Critical care time: 23

## 2023-03-03 NOTE — Plan of Care (Signed)

## 2023-03-03 NOTE — Progress Notes (Signed)
I personally saw the patient and performed a substantive portion of this encounter, including a complete performance of at least one of the key components (MDM, Hx and/or Exam), in conjunction with the resident  Admitted with hypertensive urgency -reports poor compliance with amlodipine. Now improved, off Cleviprex drip. No evidence of pulmonary edema on chest x-ray, low BNP  On exam -denies chest pain, breathing is much improved, no accessory muscle use, clear lungs, no edema, no JVD, S1-S2 regular  Labs show mild hyponatremia, slight increase in creatinine from 0.8-1.2 due to Lasix, mild leukocytosis likely due to steroids  Impression/plan Hypertensive urgency -resolved, resume home amlodipine -Await echo results, LVH noted on previous echo and EKG   Acute bronchitis -continue oral doxycycline for 5 days given purulent sputum, can DC Solu-Medrol since bronchospasm has resolved. Hypoxia has resolved  She can discharge home today, compliance with medications emphasized  [name] V. Vassie Loll MD

## 2023-03-03 NOTE — Plan of Care (Signed)

## 2023-03-03 NOTE — Progress Notes (Signed)
*  PRELIMINARY RESULTS* Echocardiogram 2D Echocardiogram has been performed.  [name] 03/03/2023, 9:09 AM

## 2023-03-04 ENCOUNTER — Other Ambulatory Visit (HOSPITAL_COMMUNITY): Payer: Self-pay

## 2023-03-04 LAB — BASIC METABOLIC PANEL
Anion gap: 10 (ref 5–15)
BUN: 27 mg/dL — ABNORMAL HIGH (ref 6–20)
CO2: 24 mmol/L (ref 22–32)
Calcium: 9.7 mg/dL (ref 8.9–10.3)
Chloride: 104 mmol/L (ref 98–111)
Creatinine, Ser: 0.89 mg/dL (ref 0.44–1.00)
GFR, Estimated: >60 mL/min (ref >60)
Glucose, Bld: 137 mg/dL — ABNORMAL HIGH (ref 70–99)
Potassium: 3.9 mmol/L (ref 3.5–5.1)
Sodium: 138 mmol/L (ref 135–145)

## 2023-03-04 LAB — GLUCOSE, CAPILLARY
Glucose-Capillary: 180 mg/dL — ABNORMAL HIGH (ref 70–99)
Glucose-Capillary: 117 mg/dL — ABNORMAL HIGH (ref 70–99)

## 2023-03-04 LAB — CBC
HCT: 45.6 % (ref 36.0–46.0)
Hemoglobin: 14.8 g/dL (ref 12.0–15.0)
MCH: 31.2 pg (ref 26.0–34.0)
MCHC: 32.5 g/dL (ref 30.0–36.0)
MCV: 96.2 fL (ref 80.0–100.0)
Platelets: 219 10*3/uL (ref 150–400)
RBC: 4.74 MIL/uL (ref 3.87–5.11)
RDW: 13.7 % (ref 11.5–15.5)
WBC: 17.6 10*3/uL — ABNORMAL HIGH (ref 4.0–10.5)
nRBC: 0 % (ref 0.0–0.2)

## 2023-03-04 MED ORDER — ALBUTEROL SULFATE HFA 108 (90 BASE) MCG/ACT IN AERS
2.0000 | INHALATION_SPRAY | Freq: Four times a day (QID) | RESPIRATORY_TRACT | 0 refills | Status: AC | PRN
Start: 2023-03-04 — End: ?
  Filled 2023-03-04: qty 6.7, 25d supply, fill #0

## 2023-03-04 MED ORDER — SENNOSIDES-DOCUSATE SODIUM 8.6-50 MG PO TABS
2.0000 | ORAL_TABLET | Freq: Once | ORAL | Status: AC
  Administered 2023-03-04: 2 via ORAL
  Filled 2023-03-04: qty 2

## 2023-03-04 MED ORDER — DOXYCYCLINE HYCLATE 100 MG PO TABS
100.0000 mg | ORAL_TABLET | Freq: Two times a day (BID) | ORAL | 0 refills | Status: AC
  Filled 2023-03-04: qty 9, 5d supply, fill #0

## 2023-03-04 MED ORDER — HYDRALAZINE HCL 25 MG PO TABS
25.0000 mg | ORAL_TABLET | Freq: Three times a day (TID) | ORAL | Status: DC
  Administered 2023-03-04: 25 mg via ORAL
  Filled 2023-03-04: qty 1

## 2023-03-04 MED ORDER — HYDRALAZINE HCL 25 MG PO TABS
25.0000 mg | ORAL_TABLET | Freq: Two times a day (BID) | ORAL | 0 refills | Status: AC
Start: 2023-03-04 — End: ?
  Filled 2023-03-04: qty 60, 30d supply, fill #0

## 2023-03-04 MED ORDER — TRIAMTERENE-HCTZ 37.5-25 MG PO TABS
1.0000 | ORAL_TABLET | Freq: Every day | ORAL | Status: DC
  Administered 2023-03-04: 1 via ORAL
  Filled 2023-03-04: qty 1

## 2023-03-04 NOTE — Evaluation (Signed)
Occupational Therapy Evaluation Patient Details Name: [name] MRN: [id number] DOB: [birth year] Today's Date: 03/04/2023   History of Present Illness [name] is a 52 y.o.  presented to the ED for chief complaint of shortness of breath and cough. BP in ED found to be BP 224/124 with MAP of 152. PMHx: uncontrolled hypertension, goiter, and GERD   Clinical Impression   Halen was evaluated s/p the above admission list. She lives alone in an apt, works and is indep at baseline. Upon evaluation, pt demonstrated indep ability to complete mobility and ADLs. After functional mobility and ADLs pt's BP was 174/120, HR 94, SpO2 on RA was 98 - RN made aware. Pt does not require further acute, or follow up OT services. Recommend discharge back to pt's environment with assist as needed. OT to sign off with appreciation of order, please re-consult if needed.         If plan is discharge home, recommend the following: Assistance with cooking/housework;Assist for transportation    Functional Status Assessment  Patient has had a recent decline in their functional status and demonstrates the ability to make significant improvements in function in a reasonable and predictable amount of time.  Equipment Recommendations  None recommended by OT       Precautions / Restrictions Precautions Precautions: None Restrictions Weight Bearing Restrictions: No      Mobility Bed Mobility Overal bed mobility: Independent                  Transfers Overall transfer level: Independent                        Balance Overall balance assessment: No apparent balance deficits (not formally assessed)             ADL either performed or assessed with clinical judgement   ADL Overall ADL's : Independent                 Vision Baseline Vision/History: 0 No visual deficits Vision Assessment?: No apparent visual deficits     Perception Perception: Within Functional Limits        Praxis Praxis: WFL       Pertinent Vitals/Pain Pain Assessment Pain Assessment: No/denies pain     Extremity/Trunk Assessment Upper Extremity Assessment Upper Extremity Assessment: Overall WFL for tasks assessed   Lower Extremity Assessment Lower Extremity Assessment: Overall WFL for tasks assessed   Cervical / Trunk Assessment Cervical / Trunk Assessment: Normal   Communication Communication Communication: No apparent difficulties   Cognition Arousal: Alert Behavior During Therapy: WFL for tasks assessed/performed Overall Cognitive Status: Within Functional Limits for tasks assessed               General Comments  BP after mobility 174/20, HR 94, SpO2 on RA     Home Living Family/patient expects to be discharged to:: Private residence Living Arrangements: Alone Available Help at Discharge: Family;Available 24 hours/day Type of Home: Apartment Home Access: Stairs to enter Entrance Stairs-Number of Steps: 3+3 Entrance Stairs-Rails: Right;Left Home Layout: One level     Bathroom Shower/Tub: Producer, television/film/video: Standard     Home Equipment: None          Prior Functioning/Environment Prior Level of Function : Independent/Modified Independent;Driving;Working/employed             Mobility Comments: no AD, indep ADLs Comments: works at a [employer]  List: Decreased range of motion;Decreased activity tolerance;Impaired balance (sitting and/or standing);Decreased safety awareness;Decreased knowledge of use of DME or AE;Decreased knowledge of precautions         OT Goals(Current goals can be found in the care plan section) Acute Rehab OT Goals Patient Stated Goal: home OT Goal Formulation: With patient Time For Goal Achievement: 03/04/23 Potential to Achieve Goals: Good   AM-PAC OT "6 Clicks" Daily Activity     Outcome Measure Help from another person eating meals?: None Help from another person taking care of  personal grooming?: None Help from another person toileting, which includes using toliet, bedpan, or urinal?: None Help from another person bathing (including washing, rinsing, drying)?: None Help from another person to put on and taking off regular upper body clothing?: None Help from another person to put on and taking off regular lower body clothing?: None 6 Click Score: 24   End of Session Nurse Communication: Mobility status  Activity Tolerance: Patient tolerated treatment well Patient left: in bed;with call bell/phone within reach  OT Visit Diagnosis: Unsteadiness on feet (R26.81);Other abnormalities of gait and mobility (R26.89);Muscle weakness (generalized) (M62.81)                Time: 0950-1009 OT Time Calculation (min): 19 min Charges:  OT General Charges $OT Visit: 1 Visit OT Evaluation $OT Eval Low Complexity: 1 Low  Derenda Mis, OTR/L Acute Rehabilitation Services Office [phone] Secure Chat Communication Preferred   Donia Pounds 03/04/2023, 10:56 AM

## 2023-03-04 NOTE — Discharge Instructions (Signed)
Follow with Dois Davenport, MD in 5-7 days  Please get a complete blood count and chemistry panel checked by your Primary MD at your next visit, and again as instructed by your Primary MD. Please get your medications reviewed and adjusted by your Primary MD.  Please request your Primary MD to go over all Hospital Tests and Procedure/Radiological results at the follow up, please get all Hospital records sent to your Prim MD by signing hospital release before you go home.  In some cases, there will be blood work, cultures and biopsy results pending at the time of your discharge. Please request that your primary care M.D. goes through all the records of your hospital data and follows up on these results.  If you had Pneumonia of Lung problems at the Hospital: Please get a 2 view Chest X ray done in 6-8 weeks after hospital discharge or sooner if instructed by your Primary MD.  If you have Congestive Heart Failure: Please call your Cardiologist or Primary MD anytime you have any of the following symptoms:  1) 3 pound weight gain in 24 hours or 5 pounds in 1 week  2) shortness of breath, with or without a dry hacking cough  3) swelling in the hands, feet or stomach  4) if you have to sleep on extra pillows at night in order to breathe  Follow cardiac low salt diet and 1.5 lit/day fluid restriction.  If you have diabetes Accuchecks 4 times/day, Once in AM empty stomach and then before each meal. Log in all results and show them to your primary doctor at your next visit. If any glucose reading is under 80 or above 300 call your primary MD immediately.  If you have Seizure/Convulsions/Epilepsy: Please do not drive, operate heavy machinery, participate in activities at heights or participate in high speed sports until you have seen by Primary MD or a Neurologist and advised to do so again. Per [facility] statutes, patients with seizures are not allowed to drive until they have been  seizure-free for six months.  Use caution when using heavy equipment or power tools. Avoid working on ladders or at heights. Take showers instead of baths. Ensure the water temperature is not too high on the home water heater. Do not go swimming alone. Do not lock yourself in a room alone (i.e. bathroom). When caring for infants or small children, sit down when holding, feeding, or changing them to minimize risk of injury to the child in the event you have a seizure. Maintain good sleep hygiene. Avoid alcohol.   If you had Gastrointestinal Bleeding: Please ask your Primary MD to check a complete blood count within one week of discharge or at your next visit. Your endoscopic/colonoscopic biopsies that are pending at the time of discharge, will also need to followed by your Primary MD.  Get Medicines reviewed and adjusted. Please take all your medications with you for your next visit with your Primary MD  Please request your Primary MD to go over all hospital tests and procedure/radiological results at the follow up, please ask your Primary MD to get all Hospital records sent to his/her office.  If you experience worsening of your admission symptoms, develop shortness of breath, life threatening emergency, suicidal or homicidal thoughts you must seek medical attention immediately by calling 911 or calling your MD immediately  if symptoms less severe.  You must read complete instructions/literature along with all the possible adverse reactions/side effects for all the Medicines you  take and that have been prescribed to you. Take any new Medicines after you have completely understood and accpet all the possible adverse reactions/side effects.   Do not drive or operate heavy machinery when taking Pain medications.   Do not take more than prescribed Pain, Sleep and Anxiety Medications  Special Instructions: If you have smoked or chewed Tobacco  in the last 2 yrs please stop smoking, stop any regular  Alcohol  and or any Recreational drug use.  Wear Seat belts while driving.  Please note You were cared for by a hospitalist during your hospital stay. If you have any questions about your discharge medications or the care you received while you were in the hospital after you are discharged, you can call the unit and asked to speak with the hospitalist on call if the hospitalist that took care of you is not available. Once you are discharged, your primary care physician will handle any further medical issues. Please note that NO REFILLS for any discharge medications will be authorized once you are discharged, as it is imperative that you return to your primary care physician (or establish a relationship with a primary care physician if you do not have one) for your aftercare needs so that they can reassess your need for medications and monitor your lab values.  You can reach the hospitalist office at phone [phone] or fax [phone]   If you do not have a primary care physician, you can call [phone] for a physician referral.  Activity: As tolerated with Full fall precautions use walker/cane & assistance as needed    Diet: low sodium  Disposition Home

## 2023-03-04 NOTE — Discharge Summary (Signed)
Physician Discharge Summary  [name] BJY:[id number] DOB: 1971/03/20 DOA: 03/01/2023  PCP: Dois Davenport, MD  Admit date: 03/01/2023 Discharge date: 03/04/2023  Admitted From: home Disposition:  home  Recommendations for Outpatient Follow-up:  Follow up with PCP in 1-2 weeks  Home Health: none Equipment/Devices: none  Discharge Condition: stable CODE STATUS: Full code  HPI: Per admitting MD, [name] is a 52 y.o. female with medical history significant for hypertension who presents to the emergency department with shortness of breath and cough. Patient developed cough and shortness of breath yesterday morning.  Symptoms worsened this evening to the point where she was having difficulty catching her breath, prompting her to call EMS.  She is found to be tachypneic with wheezing and was given 10 mg nebulized albuterol prior to arrival in the ED.  Patient reports significant improvement with this.  She denies fever or sick contacts.  She has had a headache which she describes as "not bad" and similar to her frequent headaches.  She has not taken her antihypertensives recently.  There is no chest pain, vision change, focal numbness, or focal weakness.  "Asthma" noted on problem list but patient denies any history of this.   Hospital Course / Discharge diagnoses: Principal Problem:   Hypertensive crisis Active Problems:   Acute on chronic heart failure with preserved ejection fraction (HFpEF) (HCC)   Acute respiratory failure with hypoxia (HCC)   Elevated troponin   Hypokalemia   Principal problem Hypertensive urgency-initially admitted to the ICU due to hypertensive urgency as well as increased oxygen requirements, felt to be in the setting of nonadherence to home medications.  She eventually improved and was placed on oral agents, and hydralazine had to be added to improve blood pressure control.  She is now improved, able to ambulate without difficulties and will be  discharged home in stable condition.  She was counseled for a low-sodium diet  Active problems Acute bronchitis, hypoxic respiratory failure-required supplemental oxygen and initially had to be placed on doxycycline and steroids, with improvement in her bronchospasms.  Steroids have been discontinued, and pulmonary recommends to continue doxycycline for total of 5 days.  Chest x-ray was unremarkable.  Sepsis ruled out   Discharge Instructions   Allergies as of 03/04/2023   No Known Allergies      Medication List     TAKE these medications    albuterol 108 (90 Base) MCG/ACT inhaler Commonly known as: VENTOLIN HFA Inhale 2 puffs into the lungs every 6 (six) hours as needed for wheezing or shortness of breath.   amLODipine 10 MG tablet Commonly known as: NORVASC Take 1 tablet (10 mg total) by mouth daily.   aspirin EC 81 MG tablet Take 81 mg by mouth daily.   Blood Pressure Kit 1 each by Does not apply route daily after breakfast.   doxycycline 100 MG tablet Commonly known as: VIBRA-TABS Take 1 tablet (100 mg total) by mouth every 12 (twelve) hours for 9 doses.   hydrALAZINE 25 MG tablet Commonly known as: APRESOLINE Take 1 tablet (25 mg total) by mouth 2 (two) times daily.   potassium chloride SA 20 MEQ tablet Commonly known as: KLOR-CON M Take 1 tablet (20 mEq total) by mouth daily.   tetrahydrozoline-zinc 0.05-0.25 % ophthalmic solution Commonly known as: VISINE-AC 2 drops 3 (three) times daily as needed.   triamterene-hydrochlorothiazide 37.5-25 MG capsule Commonly known as: DYAZIDE Take 1 each (1 capsule total) by mouth daily.  Consultations: PCCM  Procedures/Studies:  ECHOCARDIOGRAM COMPLETE  Result Date: 03/03/2023    ECHOCARDIOGRAM REPORT   Patient Name:   [name] Date of Exam: 03/03/2023 Medical Rec #:  [id number]      Height:       62.0 in Accession #:    [id number]     Weight:       196.4 lb Date of Birth:  1970-08-04       BSA:           1.897 m Patient Age:    52 years       BP:           133/92 mmHg Patient Gender: F              HR:           85 bpm. Exam Location:  Inpatient Procedure: 2D Echo, Cardiac Doppler and Color Doppler Indications:    Dyspnea R06.0  History:        Patient has prior history of Echocardiogram examinations, most                 recent 12/14/2018. Cardiomegaly and CHF; Risk Factors:Non-Smoker                 and Hypertension.  Sonographer:    Dondra Prader RVT RCS Referring Phys: Lavone Neri OPYD  Sonographer Comments: Technically difficult study due to poor echo windows, suboptimal parasternal window, suboptimal subcostal window and patient is obese. Image acquisition challenging due to patient body habitus. IMPRESSIONS  1. Left ventricular ejection fraction, by estimation, is 55 to 60%. The left ventricle has normal function. The left ventricle has no regional wall motion abnormalities. Left ventricular diastolic parameters are consistent with Grade II diastolic dysfunction (pseudonormalization).  2. Right ventricular systolic function is normal. The right ventricular size is normal.  3. Left atrial size was severely dilated.  4. The mitral valve is grossly normal. Trivial mitral valve regurgitation. No evidence of mitral stenosis.  5. The aortic valve is tricuspid. There is mild calcification of the aortic valve. Aortic valve regurgitation is not visualized. Aortic valve sclerosis is present, with no evidence of aortic valve stenosis.  6. The inferior vena cava is normal in size with <50% respiratory variability, suggesting right atrial pressure of 8 mmHg. Comparison(s): No significant change from prior study. 11/2018-EF 60-65%. FINDINGS  Left Ventricle: Left ventricular ejection fraction, by estimation, is 55 to 60%. The left ventricle has normal function. The left ventricle has no regional wall motion abnormalities. The left ventricular internal cavity size was normal in size. Suboptimal image quality limits for assessment  of left ventricular hypertrophy. Left ventricular diastolic parameters are consistent with Grade II diastolic dysfunction (pseudonormalization). Right Ventricle: The right ventricular size is normal. No increase in right ventricular wall thickness. Right ventricular systolic function is normal. Left Atrium: Left atrial size was severely dilated. Right Atrium: Right atrial size was normal in size. Pericardium: There is no evidence of pericardial effusion. Presence of epicardial fat layer. Mitral Valve: The mitral valve is grossly normal. Trivial mitral valve regurgitation. No evidence of mitral valve stenosis. Tricuspid Valve: The tricuspid valve is grossly normal. Tricuspid valve regurgitation is trivial. No evidence of tricuspid stenosis. Aortic Valve: The aortic valve is tricuspid. There is mild calcification of the aortic valve. Aortic valve regurgitation is not visualized. Aortic valve sclerosis is present, with no evidence of aortic valve stenosis. Aortic valve mean gradient measures 3.0 mmHg. Aortic valve peak gradient  measures 5.3 mmHg. Aortic valve area, by VTI measures 2.52 cm. Pulmonic Valve: The pulmonic valve was grossly normal. Pulmonic valve regurgitation is not visualized. No evidence of pulmonic stenosis. Aorta: The aortic root is normal in size and structure. Venous: The inferior vena cava is normal in size with less than 50% respiratory variability, suggesting right atrial pressure of 8 mmHg. IAS/Shunts: The atrial septum is grossly normal.  LEFT VENTRICLE PLAX 2D LVIDd:         4.70 cm   Diastology LVIDs:         3.05 cm   LV e' medial:    4.43 cm/s LV PW:         1.20 cm   LV E/e' medial:  24.6 LV IVS:        0.90 cm   LV e' lateral:   5.48 cm/s LVOT diam:     2.00 cm   LV E/e' lateral: 19.9 LV SV:         51 LV SV Index:   27 LVOT Area:     3.14 cm  RIGHT VENTRICLE             IVC RV S prime:     12.80 cm/s  IVC diam: 1.80 cm TAPSE (M-mode): 2.4 cm LEFT ATRIUM              Index        RIGHT  ATRIUM           Index LA diam:        4.00 cm  2.11 cm/m   RA Area:     13.20 cm LA Vol (A2C):   122.0 ml 64.31 ml/m  RA Volume:   34.10 ml  17.97 ml/m LA Vol (A4C):   98.4 ml  51.87 ml/m LA Biplane Vol: 112.0 ml 59.04 ml/m  AORTIC VALVE                    PULMONIC VALVE AV Area (Vmax):    2.57 cm     PV Vmax:       1.01 m/s AV Area (Vmean):   2.39 cm     PV Peak grad:  4.1 mmHg AV Area (VTI):     2.52 cm AV Vmax:           115.50 cm/s AV Vmean:          79.000 cm/s AV VTI:            0.202 m AV Peak Grad:      5.3 mmHg AV Mean Grad:      3.0 mmHg LVOT Vmax:         94.60 cm/s LVOT Vmean:        60.200 cm/s LVOT VTI:          0.162 m LVOT/AV VTI ratio: 0.80  AORTA Ao Root diam: 2.90 cm MITRAL VALVE MV Area (PHT): 5.66 cm     SHUNTS MV Decel Time: 134 msec     Systemic VTI:  0.16 m MV E velocity: 109.00 cm/s  Systemic Diam: 2.00 cm MV A velocity: 86.40 cm/s MV E/A ratio:  1.26 Lennie Odor MD Electronically signed by Lennie Odor MD Signature Date/Time: 03/03/2023/10:25:55 AM    Final    DG CHEST PORT 1 VIEW  Result Date: 03/02/2023 CLINICAL DATA:  Cough and wheezing EXAM: PORTABLE CHEST 1 VIEW COMPARISON:  Same day chest radiograph FINDINGS: The cardiomediastinal silhouette is stable with unchanged mild  cardiomegaly. The lungs are stable, with no new focal airspace disease since the study obtained earlier the same day. There is no pulmonary edema. There is no pleural effusion or pneumothorax There is no acute osseous abnormality. IMPRESSION: Stable chest. Electronically Signed   By: Lesia Hausen M.D.   On: 03/02/2023 09:04   DG CHEST PORT 1 VIEW  Result Date: 03/02/2023 CLINICAL DATA:  [id number] with hypoxic respiratory failure. EXAM: PORTABLE CHEST 1 VIEW COMPARISON:  Portable chest yesterday at 9:29 p.m. FINDINGS: 4:38 a.m. There is mild cardiomegaly but no findings of acute CHF. The mediastinum is normally outlined. The lungs are clear. Thoracic spondylosis. Stable chest. IMPRESSION: No evidence  of acute chest disease. Mild cardiomegaly. Electronically Signed   By: Almira Bar M.D.   On: 03/02/2023 05:04   DG Chest Portable 1 View  Result Date: 03/01/2023 CLINICAL DATA:  Shortness of breath EXAM: PORTABLE CHEST 1 VIEW COMPARISON:  09/10/2020 FINDINGS: The heart size and mediastinal contours are within normal limits. Both lungs are clear. The visualized skeletal structures are unremarkable. IMPRESSION: No active disease. Electronically Signed   By: Jasmine Pang M.D.   On: 03/01/2023 21:37     Subjective: - no chest pain, shortness of breath, no abdominal pain, nausea or vomiting.   Discharge Exam: BP (!) 146/104 (BP Location: Right Arm)   Pulse 79   Temp (!) 97.4 F (36.3 C) (Oral)   Resp 17   Wt 92.2 kg   LMP 12/04/2018   SpO2 95%   BMI 37.18 kg/m   General: Pt is alert, awake, not in acute distress Cardiovascular: RRR, S1/S2 +, no rubs, no gallops Respiratory: CTA bilaterally, no wheezing, no rhonchi Abdominal: Soft, NT, ND, bowel sounds + Extremities: no edema, no cyanosis    The results of significant diagnostics from this hospitalization (including imaging, microbiology, ancillary and laboratory) are listed below for reference.     Microbiology: Recent Results (from the past 240 hour(s))  Resp panel by RT-PCR (RSV, Flu A&B, Covid) Anterior Nasal Swab     Status: None   Collection Time: 03/01/23 10:08 PM   Specimen: Anterior Nasal Swab  Result Value Ref Range Status   SARS Coronavirus 2 by RT PCR NEGATIVE NEGATIVE Final   Influenza A by PCR NEGATIVE NEGATIVE Final   Influenza B by PCR NEGATIVE NEGATIVE Final    Comment: (NOTE) The Xpert Xpress SARS-CoV-2/FLU/RSV plus assay is intended as an aid in the diagnosis of influenza from Nasopharyngeal swab specimens and should not be used as a sole basis for treatment. Nasal washings and aspirates are unacceptable for Xpert Xpress SARS-CoV-2/FLU/RSV testing.  Fact Sheet for  Patients: BloggerCourse.com  Fact Sheet for Healthcare Providers: SeriousBroker.it  This test is not yet approved or cleared by the [place] FDA and has been authorized for detection and/or diagnosis of SARS-CoV-2 by FDA under an Emergency Use Authorization (EUA). This EUA will remain in effect (meaning this test can be used) for the duration of the COVID-19 declaration under Section 564(b)(1) of the Act, 21 U.S.C. section 360bbb-3(b)(1), unless the authorization is terminated or revoked.     Resp Syncytial Virus by PCR NEGATIVE NEGATIVE Final    Comment: (NOTE) Fact Sheet for Patients: BloggerCourse.com  Fact Sheet for Healthcare Providers: SeriousBroker.it  This test is not yet approved or cleared by the [place] FDA and has been authorized for detection and/or diagnosis of SARS-CoV-2 by FDA under an Emergency Use Authorization (EUA). This EUA will remain in effect (meaning this  test can be used) for the duration of the COVID-19 declaration under Section 564(b)(1) of the Act, 21 U.S.C. section 360bbb-3(b)(1), unless the authorization is terminated or revoked.  Performed at [facility], [address], [place], [place] [address]      Labs: Basic Metabolic Panel: Recent Labs  Lab 03/01/23 2208 03/02/23 0323 03/03/23 0329 03/04/23 0515  NA 138 136 134* 138  K 2.8* 4.2 4.2 3.9  CL 100 101 98 104  CO2 23 24 23 24   GLUCOSE 183* 151* 193* 137*  BUN 10 8 23* 27*  CREATININE 0.93 0.82 1.23* 0.89  CALCIUM 9.1 9.4 9.6 9.7  MG  --  2.2  --   --    Liver Function Tests: No results for input(s): "AST", "ALT", "ALKPHOS", "BILITOT", "PROT", "ALBUMIN" in the last 168 hours. CBC: Recent Labs  Lab 03/01/23 2208 03/02/23 0323 03/03/23 0329 03/04/23 0515  WBC 8.8 10.8* 11.5* 17.6*  NEUTROABS 6.7  --   --   --   HGB 13.7 14.6 14.3 14.8  HCT 42.0 44.5 43.7  45.6  MCV 96.8 97.4 97.5 96.2  PLT 168 190 178 219   CBG: Recent Labs  Lab 03/03/23 1132 03/03/23 1648 03/03/23 2156 03/04/23 0738 03/04/23 1144  GLUCAP 162* 314* 196* 180* 117*   Hgb A1c Recent Labs    03/02/23 0323  HGBA1C 6.1*   Lipid Profile No results for input(s): "CHOL", "HDL", "LDLCALC", "TRIG", "CHOLHDL", "LDLDIRECT" in the last 72 hours. Thyroid function studies No results for input(s): "TSH", "T4TOTAL", "T3FREE", "THYROIDAB" in the last 72 hours.  Invalid input(s): "FREET3" Urinalysis    Component Value Date/Time   COLORURINE RED (A) 12/13/2018 2346   APPEARANCEUR HAZY (A) 12/13/2018 2346   LABSPEC 1.044 (H) 12/13/2018 2346   PHURINE 6.0 12/13/2018 2346   GLUCOSEU NEGATIVE 12/13/2018 2346   HGBUR LARGE (A) 12/13/2018 2346   BILIRUBINUR NEGATIVE 12/13/2018 2346   BILIRUBINUR negative 07/10/2016 1213   BILIRUBINUR neg 07/29/2012 0937   KETONESUR NEGATIVE 12/13/2018 2346   PROTEINUR 100 (A) 12/13/2018 2346   UROBILINOGEN 0.2 07/10/2016 1213   UROBILINOGEN 0.2 07/09/2010 1842   NITRITE NEGATIVE 12/13/2018 2346   LEUKOCYTESUR NEGATIVE 12/13/2018 2346    FURTHER DISCHARGE INSTRUCTIONS:   Get Medicines reviewed and adjusted: Please take all your medications with you for your next visit with your Primary MD   Laboratory/radiological data: Please request your Primary MD to go over all hospital tests and procedure/radiological results at the follow up, please ask your Primary MD to get all Hospital records sent to his/her office.   In some cases, they will be blood work, cultures and biopsy results pending at the time of your discharge. Please request that your primary care M.D. goes through all the records of your hospital data and follows up on these results.   Also Note the following: If you experience worsening of your admission symptoms, develop shortness of breath, life threatening emergency, suicidal or homicidal thoughts you must seek medical  attention immediately by calling 911 or calling your MD immediately  if symptoms less severe.   You must read complete instructions/literature along with all the possible adverse reactions/side effects for all the Medicines you take and that have been prescribed to you. Take any new Medicines after you have completely understood and accpet all the possible adverse reactions/side effects.    Do not drive when taking Pain medications or sleeping medications (Benzodaizepines)   Do not take more than prescribed Pain, Sleep and  Anxiety Medications. It is not advisable to combine anxiety,sleep and pain medications without talking with your primary care practitioner   Special Instructions: If you have smoked or chewed Tobacco  in the last 2 yrs please stop smoking, stop any regular Alcohol  and or any Recreational drug use.   Wear Seat belts while driving.   Please note: You were cared for by a hospitalist during your hospital stay. Once you are discharged, your primary care physician will handle any further medical issues. Please note that NO REFILLS for any discharge medications will be authorized once you are discharged, as it is imperative that you return to your primary care physician (or establish a relationship with a primary care physician if you do not have one) for your post hospital discharge needs so that they can reassess your need for medications and monitor your lab values.  Time coordinating discharge: 35 minutes  SIGNED:  Pamella Pert, MD, PhD 03/04/2023, 1:45 PM

## 2024-04-08 ENCOUNTER — Emergency Department (HOSPITAL_BASED_OUTPATIENT_CLINIC_OR_DEPARTMENT_OTHER)

## 2024-04-08 ENCOUNTER — Inpatient Hospital Stay (HOSPITAL_BASED_OUTPATIENT_CLINIC_OR_DEPARTMENT_OTHER)
Admission: EM | Admit: 2024-04-08 | Discharge: 2024-04-13 | DRG: 638 | Disposition: A | Source: Ambulatory Visit | Attending: Internal Medicine | Admitting: Internal Medicine

## 2024-04-08 ENCOUNTER — Other Ambulatory Visit: Payer: Self-pay

## 2024-04-08 ENCOUNTER — Encounter (HOSPITAL_BASED_OUTPATIENT_CLINIC_OR_DEPARTMENT_OTHER): Payer: Self-pay | Admitting: Emergency Medicine

## 2024-04-08 ENCOUNTER — Ambulatory Visit
Admission: EM | Admit: 2024-04-08 | Discharge: 2024-04-08 | Disposition: A | Discharge status: Home / Self Care | Attending: Family Medicine | Admitting: Family Medicine

## 2024-04-08 DIAGNOSIS — N179 Acute kidney failure, unspecified: Secondary | ICD-10-CM | POA: Diagnosis present

## 2024-04-08 DIAGNOSIS — B3781 Candidal esophagitis: Secondary | ICD-10-CM | POA: Diagnosis present

## 2024-04-08 DIAGNOSIS — E876 Hypokalemia: Secondary | ICD-10-CM | POA: Diagnosis present

## 2024-04-08 DIAGNOSIS — Z833 Family history of diabetes mellitus: Secondary | ICD-10-CM | POA: Diagnosis not present

## 2024-04-08 DIAGNOSIS — R051 Acute cough: Secondary | ICD-10-CM

## 2024-04-08 DIAGNOSIS — J029 Acute pharyngitis, unspecified: Secondary | ICD-10-CM | POA: Diagnosis not present

## 2024-04-08 DIAGNOSIS — E66813 Obesity, class 3: Secondary | ICD-10-CM | POA: Diagnosis present

## 2024-04-08 DIAGNOSIS — Z1152 Encounter for screening for COVID-19: Secondary | ICD-10-CM | POA: Diagnosis not present

## 2024-04-08 DIAGNOSIS — Z794 Long term (current) use of insulin: Secondary | ICD-10-CM | POA: Diagnosis not present

## 2024-04-08 DIAGNOSIS — R110 Nausea: Secondary | ICD-10-CM

## 2024-04-08 DIAGNOSIS — M6281 Muscle weakness (generalized): Secondary | ICD-10-CM | POA: Diagnosis not present

## 2024-04-08 DIAGNOSIS — E871 Hypo-osmolality and hyponatremia: Secondary | ICD-10-CM | POA: Diagnosis present

## 2024-04-08 DIAGNOSIS — Z79899 Other long term (current) drug therapy: Secondary | ICD-10-CM

## 2024-04-08 DIAGNOSIS — E861 Hypovolemia: Secondary | ICD-10-CM | POA: Diagnosis present

## 2024-04-08 DIAGNOSIS — E875 Hyperkalemia: Secondary | ICD-10-CM | POA: Diagnosis present

## 2024-04-08 DIAGNOSIS — B370 Candidal stomatitis: Secondary | ICD-10-CM | POA: Diagnosis present

## 2024-04-08 DIAGNOSIS — Z6832 Body mass index (BMI) 32.0-32.9, adult: Secondary | ICD-10-CM

## 2024-04-08 DIAGNOSIS — E860 Dehydration: Secondary | ICD-10-CM | POA: Diagnosis present

## 2024-04-08 DIAGNOSIS — I10 Essential (primary) hypertension: Secondary | ICD-10-CM | POA: Diagnosis present

## 2024-04-08 DIAGNOSIS — E1110 Type 2 diabetes mellitus with ketoacidosis without coma: Secondary | ICD-10-CM | POA: Diagnosis present

## 2024-04-08 DIAGNOSIS — Z825 Family history of asthma and other chronic lower respiratory diseases: Secondary | ICD-10-CM | POA: Diagnosis not present

## 2024-04-08 DIAGNOSIS — Z8249 Family history of ischemic heart disease and other diseases of the circulatory system: Secondary | ICD-10-CM | POA: Diagnosis not present

## 2024-04-08 DIAGNOSIS — Z7982 Long term (current) use of aspirin: Secondary | ICD-10-CM | POA: Diagnosis not present

## 2024-04-08 DIAGNOSIS — E1100 Type 2 diabetes mellitus with hyperosmolarity without nonketotic hyperglycemic-hyperosmolar coma (NKHHC): Secondary | ICD-10-CM | POA: Diagnosis present

## 2024-04-08 LAB — CBG MONITORING, ED
Glucose-Capillary: >600 mg/dL (ref 70–99)
Glucose-Capillary: >600 mg/dL (ref 70–99)
Glucose-Capillary: >600 mg/dL (ref 70–99)
Glucose-Capillary: >600 mg/dL (ref 70–99)
Glucose-Capillary: >600 mg/dL (ref 70–99)

## 2024-04-08 LAB — I-STAT VENOUS BLOOD GAS, ED
Acid-base deficit: 2 mmol/L (ref 0.0–2.0)
Acid-base deficit: 2 mmol/L (ref 0.0–2.0)
Bicarbonate: 26 mmol/L (ref 20.0–28.0)
Bicarbonate: 26.3 mmol/L (ref 20.0–28.0)
Calcium, Ion: 1.1 mmol/L — ABNORMAL LOW (ref 1.15–1.40)
Calcium, Ion: 1.11 mmol/L — ABNORMAL LOW (ref 1.15–1.40)
HCT: 51 % — ABNORMAL HIGH (ref 36.0–46.0)
HCT: 51 % — ABNORMAL HIGH (ref 36.0–46.0)
Hemoglobin: 17.3 g/dL — ABNORMAL HIGH (ref 12.0–15.0)
Hemoglobin: 17.3 g/dL — ABNORMAL HIGH (ref 12.0–15.0)
O2 Saturation: 46 %
O2 Saturation: 46 %
Patient temperature: 98
Patient temperature: 98
Potassium: 7.2 mmol/L (ref 3.5–5.1)
Potassium: 7.3 mmol/L (ref 3.5–5.1)
Sodium: 104 mmol/L — CL (ref 135–145)
Sodium: 104 mmol/L — CL (ref 135–145)
TCO2: 28 mmol/L (ref 22–32)
TCO2: 28 mmol/L (ref 22–32)
pCO2, Ven: 54 mmHg (ref 44–60)
pCO2, Ven: 54.1 mmHg (ref 44–60)
pH, Ven: 7.289 (ref 7.25–7.43)
pH, Ven: 7.294 (ref 7.25–7.43)
pO2, Ven: 28 mmHg — CL (ref 32–45)
pO2, Ven: 28 mmHg — CL (ref 32–45)

## 2024-04-08 LAB — COMPREHENSIVE METABOLIC PANEL WITH GFR
ALT: 48 U/L — ABNORMAL HIGH (ref 0–44)
AST: 14 U/L — ABNORMAL LOW (ref 15–41)
Albumin: 4.8 g/dL (ref 3.5–5.0)
Alkaline Phosphatase: 177 U/L — ABNORMAL HIGH (ref 38–126)
BUN: 60 mg/dL — ABNORMAL HIGH (ref 6–20)
CO2: 17 mmol/L — ABNORMAL LOW (ref 22–32)
Calcium: 10.7 mg/dL — ABNORMAL HIGH (ref 8.9–10.3)
Chloride: <65 mmol/L — CL (ref 98–111)
Creatinine, Ser: 2.72 mg/dL — ABNORMAL HIGH (ref 0.44–1.00)
GFR, Estimated: 20 mL/min — ABNORMAL LOW (ref >60)
Glucose, Bld: >1200 mg/dL (ref 70–99)
Potassium: >7.5 mmol/L (ref 3.5–5.1)
Sodium: 103 mmol/L — CL (ref 135–145)
Total Bilirubin: 1.1 mg/dL (ref 0.0–1.2)
Total Protein: 8.4 g/dL — ABNORMAL HIGH (ref 6.5–8.1)

## 2024-04-08 LAB — CBC
HCT: 52.3 % — ABNORMAL HIGH (ref 36.0–46.0)
HCT: 43.2 % (ref 36.0–46.0)
Hemoglobin: 15.4 g/dL — ABNORMAL HIGH (ref 12.0–15.0)
Hemoglobin: 15.1 g/dL — ABNORMAL HIGH (ref 12.0–15.0)
MCH: 30.9 pg (ref 26.0–34.0)
MCH: 30.3 pg (ref 26.0–34.0)
MCHC: 29.4 g/dL — ABNORMAL LOW (ref 30.0–36.0)
MCHC: 35 g/dL (ref 30.0–36.0)
MCV: 105 fL — ABNORMAL HIGH (ref 80.0–100.0)
MCV: 86.6 fL (ref 80.0–100.0)
Platelets: 294 K/uL (ref 150–400)
Platelets: 290 K/uL (ref 150–400)
RBC: 4.98 MIL/uL (ref 3.87–5.11)
RBC: 4.99 MIL/uL (ref 3.87–5.11)
RDW: 13.5 % (ref 11.5–15.5)
RDW: 12.7 % (ref 11.5–15.5)
WBC: 6.9 K/uL (ref 4.0–10.5)
WBC: 9 K/uL (ref 4.0–10.5)
nRBC: 0 % (ref 0.0–0.2)
nRBC: 0 % (ref 0.0–0.2)

## 2024-04-08 LAB — BASIC METABOLIC PANEL WITH GFR
Anion gap: 14 (ref 5–15)
Anion gap: 13 (ref 5–15)
Anion gap: 14 (ref 5–15)
BUN: 49 mg/dL — ABNORMAL HIGH (ref 6–20)
BUN: 50 mg/dL — ABNORMAL HIGH (ref 6–20)
BUN: 49 mg/dL — ABNORMAL HIGH (ref 6–20)
CO2: 25 mmol/L (ref 22–32)
CO2: 26 mmol/L (ref 22–32)
CO2: 27 mmol/L (ref 22–32)
Calcium: 9.7 mg/dL (ref 8.9–10.3)
Calcium: 9.9 mg/dL (ref 8.9–10.3)
Calcium: 10.1 mg/dL (ref 8.9–10.3)
Chloride: 84 mmol/L — ABNORMAL LOW (ref 98–111)
Chloride: 87 mmol/L — ABNORMAL LOW (ref 98–111)
Chloride: 91 mmol/L — ABNORMAL LOW (ref 98–111)
Creatinine, Ser: 2.05 mg/dL — ABNORMAL HIGH (ref 0.44–1.00)
Creatinine, Ser: 1.82 mg/dL — ABNORMAL HIGH (ref 0.44–1.00)
Creatinine, Ser: 1.7 mg/dL — ABNORMAL HIGH (ref 0.44–1.00)
GFR, Estimated: 28 mL/min — ABNORMAL LOW (ref >60)
GFR, Estimated: 32 mL/min — ABNORMAL LOW (ref >60)
GFR, Estimated: 35 mL/min — ABNORMAL LOW (ref >60)
Glucose, Bld: 751 mg/dL (ref 70–99)
Glucose, Bld: 558 mg/dL (ref 70–99)
Glucose, Bld: 273 mg/dL — ABNORMAL HIGH (ref 70–99)
Potassium: 4.7 mmol/L (ref 3.5–5.1)
Potassium: 4 mmol/L (ref 3.5–5.1)
Potassium: 3.7 mmol/L (ref 3.5–5.1)
Sodium: 124 mmol/L — ABNORMAL LOW (ref 135–145)
Sodium: 127 mmol/L — ABNORMAL LOW (ref 135–145)
Sodium: 132 mmol/L — ABNORMAL LOW (ref 135–145)

## 2024-04-08 LAB — RESP PANEL BY RT-PCR (RSV, FLU A&B, COVID)  RVPGX2
Influenza A by PCR: NEGATIVE
Influenza B by PCR: NEGATIVE
Resp Syncytial Virus by PCR: NEGATIVE
SARS Coronavirus 2 by RT PCR: NEGATIVE

## 2024-04-08 LAB — URINALYSIS, ROUTINE W REFLEX MICROSCOPIC
Bacteria, UA: NONE SEEN
Bilirubin Urine: NEGATIVE
Glucose, UA: >1000 mg/dL — AB
Hgb urine dipstick: NEGATIVE
Leukocytes,Ua: NEGATIVE
Nitrite: NEGATIVE
Protein, ur: NEGATIVE mg/dL
Specific Gravity, Urine: 1.023 (ref 1.005–1.030)
pH: 5.5 (ref 5.0–8.0)

## 2024-04-08 LAB — GLUCOSE, CAPILLARY
Glucose-Capillary: 209 mg/dL — ABNORMAL HIGH (ref 70–99)
Glucose-Capillary: 215 mg/dL — ABNORMAL HIGH (ref 70–99)
Glucose-Capillary: 273 mg/dL — ABNORMAL HIGH (ref 70–99)
Glucose-Capillary: 325 mg/dL — ABNORMAL HIGH (ref 70–99)
Glucose-Capillary: 392 mg/dL — ABNORMAL HIGH (ref 70–99)
Glucose-Capillary: 482 mg/dL — ABNORMAL HIGH (ref 70–99)
Glucose-Capillary: 541 mg/dL (ref 70–99)
Glucose-Capillary: 556 mg/dL (ref 70–99)
Glucose-Capillary: >600 mg/dL (ref 70–99)
Glucose-Capillary: >600 mg/dL (ref 70–99)

## 2024-04-08 LAB — POC COVID19/FLU A&B COMBO
Covid Antigen, POC: NEGATIVE
Influenza A Antigen, POC: NEGATIVE
Influenza B Antigen, POC: NEGATIVE

## 2024-04-08 LAB — BETA-HYDROXYBUTYRIC ACID
Beta-Hydroxybutyric Acid: 3.74 mmol/L — ABNORMAL HIGH (ref 0.05–0.27)
Beta-Hydroxybutyric Acid: 0.23 mmol/L (ref 0.05–0.27)
Beta-Hydroxybutyric Acid: 0.22 mmol/L (ref 0.05–0.27)

## 2024-04-08 LAB — POCT RAPID STREP A (OFFICE): Rapid Strep A Screen: NEGATIVE

## 2024-04-08 LAB — MRSA NEXT GEN BY PCR, NASAL: MRSA by PCR Next Gen: NOT DETECTED

## 2024-04-08 LAB — HIV ANTIBODY (ROUTINE TESTING W REFLEX): HIV Screen 4th Generation wRfx: NONREACTIVE

## 2024-04-08 LAB — OSMOLALITY: Osmolality: 342 mosm/kg (ref 275–295)

## 2024-04-08 LAB — HCG, SERUM, QUALITATIVE: Preg, Serum: NEGATIVE

## 2024-04-08 LAB — GROUP A STREP BY PCR: Group A Strep by PCR: NOT DETECTED

## 2024-04-08 MED ORDER — SODIUM BICARBONATE 8.4 % IV SOLN
50.0000 meq | Freq: Once | INTRAVENOUS | Status: AC
  Administered 2024-04-08: 50 meq via INTRAVENOUS
  Filled 2024-04-08: qty 50

## 2024-04-08 MED ORDER — CHLORHEXIDINE GLUCONATE CLOTH 2 % EX PADS
6.0000 | MEDICATED_PAD | Freq: Every day | CUTANEOUS | Status: DC
  Administered 2024-04-08 – 2024-04-12 (×4): 6 via TOPICAL

## 2024-04-08 MED ORDER — ONDANSETRON 4 MG PO TBDP
4.0000 mg | ORAL_TABLET | Freq: Once | ORAL | Status: AC
  Administered 2024-04-08: 4 mg via ORAL

## 2024-04-08 MED ORDER — LACTATED RINGERS IV SOLN: INTRAVENOUS | Status: DC

## 2024-04-08 MED ORDER — CLOTRIMAZOLE 10 MG MT TROC
10.0000 mg | Freq: Every day | OROMUCOSAL | Status: DC
  Administered 2024-04-08 – 2024-04-13 (×24): 10 mg via ORAL
  Filled 2024-04-08 (×28): qty 1

## 2024-04-08 MED ORDER — ALBUTEROL SULFATE (2.5 MG/3ML) 0.083% IN NEBU: 2.5000 mg | INHALATION_SOLUTION | RESPIRATORY_TRACT | Status: DC | PRN

## 2024-04-08 MED ORDER — POLYETHYLENE GLYCOL 3350 17 G PO PACK: 17.0000 g | PACK | Freq: Every day | ORAL | Status: DC | PRN

## 2024-04-08 MED ORDER — LACTATED RINGERS IV BOLUS: 20.0000 mL/kg | Freq: Once | INTRAVENOUS | Status: DC

## 2024-04-08 MED ORDER — SODIUM CHLORIDE 0.9 % IV BOLUS
1000.0000 mL | Freq: Once | INTRAVENOUS | Status: AC
  Administered 2024-04-08: 1000 mL via INTRAVENOUS

## 2024-04-08 MED ORDER — DEXTROSE IN LACTATED RINGERS 5 % IV SOLN: INTRAVENOUS | Status: DC

## 2024-04-08 MED ORDER — LACTATED RINGERS IV BOLUS
20.0000 mL/kg | Freq: Once | INTRAVENOUS | Status: AC
  Administered 2024-04-08: 1000 mL via INTRAVENOUS

## 2024-04-08 MED ORDER — HEPARIN SODIUM (PORCINE) 5000 UNIT/ML IJ SOLN
5000.0000 [IU] | Freq: Three times a day (TID) | INTRAMUSCULAR | Status: DC
  Administered 2024-04-08 – 2024-04-13 (×14): 5000 [IU] via SUBCUTANEOUS
  Filled 2024-04-08 (×13): qty 1

## 2024-04-08 MED ORDER — HYDRALAZINE HCL 20 MG/ML IJ SOLN
10.0000 mg | Freq: Four times a day (QID) | INTRAMUSCULAR | Status: DC | PRN
  Administered 2024-04-12: 18:00:00 10 mg via INTRAVENOUS
  Filled 2024-04-08: qty 1

## 2024-04-08 MED ORDER — ONDANSETRON HCL 4 MG PO TABS: 4.0000 mg | ORAL_TABLET | Freq: Four times a day (QID) | ORAL | Status: DC | PRN

## 2024-04-08 MED ORDER — OXYCODONE HCL 5 MG PO TABS
5.0000 mg | ORAL_TABLET | ORAL | Status: DC | PRN
  Administered 2024-04-10 – 2024-04-13 (×4): 5 mg via ORAL
  Filled 2024-04-08 (×4): qty 1

## 2024-04-08 MED ORDER — DEXTROSE 50 % IV SOLN: 0.0000 mL | INTRAVENOUS | Status: DC | PRN

## 2024-04-08 MED ORDER — ORAL CARE MOUTH RINSE: 15.0000 mL | OROMUCOSAL | Status: DC | PRN

## 2024-04-08 MED ORDER — INSULIN REGULAR(HUMAN) IN NACL 100-0.9 UT/100ML-% IV SOLN
INTRAVENOUS | Status: DC
  Administered 2024-04-08: 14 [IU]/h via INTRAVENOUS
  Administered 2024-04-08: 5.5 [IU]/h via INTRAVENOUS
  Filled 2024-04-08 (×2): qty 100

## 2024-04-08 MED ORDER — ONDANSETRON HCL 4 MG/2ML IJ SOLN
4.0000 mg | Freq: Four times a day (QID) | INTRAMUSCULAR | Status: DC | PRN
  Administered 2024-04-09: 4 mg via INTRAVENOUS
  Filled 2024-04-08: qty 2

## 2024-04-08 NOTE — ED Notes (Signed)
 Rainbow sent to lab

## 2024-04-08 NOTE — ED Notes (Signed)
 [facility] w/ cl called for transport

## 2024-04-08 NOTE — Discharge Instructions (Signed)
 Please go to the emergency room for further evaluation of your symptoms

## 2024-04-08 NOTE — H&P (Signed)
 History and Physical    Patient: [name] FMW:[id number] DOB: 02-26-1971 DOA: 04/08/2024 DOS: the patient was seen and examined on 04/08/2024 PCP: Jerome Heron Ruth, PA-C  Patient coming from: Home[facility]  Chief Complaint: Weakness, fatigue Chief Complaint  Patient presents with   Weakness   HPI: [name] is a 53 y.o. female with medical history significant of HTN, prediabetes, obesity who presented to [facility] ED on 04/08/2024 with progressive weakness, fatigue, increased urination, nausea and now with difficulty ambulating.  Patient reports that initially started having decreased appetite, weakness starting during Thanksgiving and had been drinking protein shakes, and juices.  Recently seen by her PCP and thought that she had strep throat and was given a course of antibiotics and steroids which did not help.  Also was recently prescribed Ozempic but has yet to start.  No other changes in her medications, no sick contacts.  Denies headache, no chest pain, no shortness of breath, no abdominal pain, no fever/chills/night sweats, no vomiting/diarrhea, no focal weakness, no paresthesia.  In the ED, temperature 98.2 F, HR 71, RR 18, BP 123/90, SpO2 95% room air.  VBG pH 7.289, pCO2 54.1, pO2 28.  WBC 6.9, hemoglobin 15.4, platelet count 294.  Sodium 103, potassium greater than 7.5, chloride less than 65, CO2 17, glucose greater than 1200, BUN 60, creatinine 2.72.  AST 14, ALT 48, total bilirubin 1.1.  Hydroxybutyrate acid 3.74.  Urinalysis with trace ketones, negative leukocyte/nitrite, no bacteria.  Rapid strep a screen negative.  COVID/influenza/RSV PCR negative.  Chest x-ray with no active cardiopulmonary disease process.  Patient was given 2.7 L IVF bolus, started on insulin  drip, given sodium bicarb and injection.  TRH consulted for admission and patient was transferred to [facility] for further evaluation management of DKA, new onset  diabetes.    Review of Systems: As mentioned in the history of present illness. All other systems reviewed and are negative. Past Medical History:  Diagnosis Date   Blood transfusion without reported diagnosis    GERD (gastroesophageal reflux disease)    Hypertension    Multinodular goiter    Past Surgical History:  Procedure Laterality Date   ENDOMETRIAL ABLATION     Social History:  reports that she has never smoked. She has never used smokeless tobacco. She reports current alcohol use of about 10.0 standard drinks of alcohol per week. She reports that she does not use drugs.  Allergies[1]  Family History  Problem Relation Age of Onset   Hypertension Mother    Hypertension Father    Diabetes Father    Diabetes Sister    Hypertension Sister    Hypertension Brother    Hypertension Son    Cancer Paternal Aunt    Diabetes Maternal Grandmother    Hypertension Maternal Grandmother    Diabetes Paternal Grandmother    Hypertension Paternal Grandmother    Diabetes Sister    Asthma Daughter     Prior to Admission medications  Medication Sig Start Date End Date Taking? Authorizing Provider  albuterol  (VENTOLIN  HFA) 108 (90 Base) MCG/ACT inhaler Inhale 2 puffs into the lungs every 6 (six) hours as needed for wheezing or shortness of breath. 03/04/23   Gherghe, Costin M, MD  amLODipine  (NORVASC ) 10 MG tablet Take 1 tablet (10 mg total) by mouth daily. 09/10/20   Freddi Hamilton, MD  aspirin  EC 81 MG tablet Take 81 mg by mouth daily.    [provider]  Blood Pressure KIT 1 each by  Does not apply route daily after breakfast. 12/14/18   Lue Elsie BROCKS, MD  hydrALAZINE  (APRESOLINE ) 25 MG tablet Take 1 tablet (25 mg total) by mouth 2 (two) times daily. 03/04/23   Gherghe, Costin M, MD  potassium chloride  SA (KLOR-CON ) 20 MEQ tablet Take 1 tablet (20 mEq total) by mouth daily. 09/10/20   Freddi Hamilton, MD  tetrahydrozoline-zinc (VISINE-AC) 0.05-0.25 % ophthalmic solution 2  drops 3 (three) times daily as needed.    [provider]  triamterene -hydrochlorothiazide  (DYAZIDE ) 37.5-25 MG capsule Take 1 each (1 capsule total) by mouth daily. 09/10/20   Freddi Hamilton, MD    Physical Exam: Vitals:   04/08/24 1430 04/08/24 1519 04/08/24 1521 04/08/24 1609  BP: 137/79 134/87    Pulse: 67  72   Resp: 16  15   Temp:      SpO2: 93%  93%   Weight:    81.7 kg  Height:        GEN: 53 yo female in NAD, alert and oriented x 3, obese HEENT: NCAT, PERRL, EOMI, sclera clear, MMM PULM: CTAB w/o wheezes/crackles, normal respiratory effort, on room air CV: RRR w/o M/G/R GI: abd soft, NTND, + BS MSK: no peripheral edema, muscle strength globally intact 5/5 bilateral upper/lower extremities NEURO: CN II-XII intact, no focal deficits, sensation to light touch intact PSYCH: normal mood/affect Integumentary: dry/intact, no rashes or wounds   Assessment and Plan:   Diabetic ketoacidosis Type 2 diabetes mellitus, new diagnosis Metabolic acidosis Patient presenting to the ED with progressive weakness, fatigue, nausea, decreased appetite and increased urination.  Previous history of prediabetes with a hemoglobin A1c of 6.1 on 03/02/2023.  Complicated by recent initiation of oral prednisone  by PCP for presumed strep throat.  Although patient has been having progressive symptoms since late November 2025.  Patient with glucose greater than 1200, sodium 104, potassium 7.2, sodium HCO3 17.  VBG with pH 7.2.  Urinalysis with greater than 1000 glucose, trace ketones.  Beta hydroxybutyrate acid elevated 3.74. -- Admit to stepdown unit -- Insulin  drip -- LR at 125 mL/h, change to D5 LR at 125 mL/h when CBG less than 250 -- Diabetic educator consult -- BMP/beta-hydroxybutyrate acid every 4 hours -- Update hemoglobin A1c -- Check TSH -- N.p.o. except sips with meds, ice chips  Acute renal failure Creatinine elevated 2.72 on admission.  Previous baseline 0.89 on 03/04/2023.   Etiology likely secondary to prerenal azotemia in the setting of DKA/severe dehydration. -- Hold home losartan  -- Aggressive IV fluid hydration -- Avoid nephrotoxins, renally dose all medications -- BMP every 4 hours -- Monitor urine output  Hyperkalemia Potassium 7.2 on admission, likely secondary to acute renal failure, DKA as above. -- On insulin  drip, aggressive IV fluid resuscitation -- BMP every 4 hours -- Monitor on telemetry  Hyponatremia Etiology likely multifactorial in the setting of poor oral intake with hypovolemic hyponatremia as well as pseudohyponatremia with elevated glucose of 1200.  Sodium correction for hyperglycemia = 130. --Continue IV fluid resuscitation, insulin  drip as above  HTN At baseline on amlodipine  10 mg p.o. daily, losartan  milligrams p.o. daily, triamterene /HCTZ 75-50 mg p.o. daily.  Blood pressure 137/79. -- Hold all home antihypertensives at this time -- Hydralazine  10 mg IV every 6 hours as needed SBP greater than 170  Obesity, class I Body mass index is 32.94 kg/m.      Advance Care Planning:   Code Status: Full Code   Consults: None  Family Communication: Daughter present at bedside  Severity of Illness: The appropriate patient status for this patient is INPATIENT. Inpatient status is judged to be reasonable and necessary in order to provide the required intensity of service to ensure the patient's safety. The patient's presenting symptoms, physical exam findings, and initial radiographic and laboratory data in the context of their chronic comorbidities is felt to place them at high risk for further clinical deterioration. Furthermore, it is not anticipated that the patient will be medically stable for discharge from the hospital within 2 midnights of admission.   * I certify that at the point of admission it is my clinical judgment that the patient will require inpatient hospital care spanning beyond 2 midnights from the point of admission  due to high intensity of service, high risk for further deterioration and high frequency of surveillance required.*  Author: Camellia PARAS Arianna Delsanto, DO 04/08/2024 5:06 PM  For on call review [email or web address].     [1] No Known Allergies

## 2024-04-08 NOTE — ED Provider Notes (Signed)
 [place] EMERGENCY DEPARTMENT AT [facility] Provider Note   CSN: [id number] Arrival date & time: 04/08/24  1044     Patient presents with: Weakness   [name] is a 53 y.o. female.   Pt is a 53 yo female with pmhx significant for HTN, GERD, multinodular goiter, and anemia.  Pt has been feeling weak for several days.  She has had a sore throat and was treated for strep with amox, prednisone , and diflucan.  She said her strep test was negative, but they thought she had strep, so she was given abx.  She was told that she also has diabetes, but is not taking any medicine for it.  She said her pcp prescribed ozempic, but she has not taken it.  Pt said she has no energy and does not have the strength to walk.         Prior to Admission medications  Medication Sig Start Date End Date Taking? Authorizing Provider  albuterol  (VENTOLIN  HFA) 108 (90 Base) MCG/ACT inhaler Inhale 2 puffs into the lungs every 6 (six) hours as needed for wheezing or shortness of breath. 03/04/23   Gherghe, Costin M, MD  amLODipine  (NORVASC ) 10 MG tablet Take 1 tablet (10 mg total) by mouth daily. 09/10/20   Freddi Hamilton, MD  aspirin  EC 81 MG tablet Take 81 mg by mouth daily.    [provider]  Blood Pressure KIT 1 each by Does not apply route daily after breakfast. 12/14/18   Lue Elsie BROCKS, MD  hydrALAZINE  (APRESOLINE ) 25 MG tablet Take 1 tablet (25 mg total) by mouth 2 (two) times daily. 03/04/23   Trixie Nilda HERO, MD  potassium chloride  SA (KLOR-CON ) 20 MEQ tablet Take 1 tablet (20 mEq total) by mouth daily. 09/10/20   Freddi Hamilton, MD  tetrahydrozoline-zinc (VISINE-AC) 0.05-0.25 % ophthalmic solution 2 drops 3 (three) times daily as needed.    [provider]  triamterene -hydrochlorothiazide  (DYAZIDE ) 37.5-25 MG capsule Take 1 each (1 capsule total) by mouth daily. 09/10/20   Freddi Hamilton, MD    Allergies: Patient has no known allergies.    Review of Systems   HENT:  Positive for sore throat.   Endocrine: Positive for polydipsia.  Neurological:  Positive for weakness.  All other systems reviewed and are negative.   Updated Vital Signs BP (!) 143/82   Pulse 68   Temp 98.2 F (36.8 C)   Resp 12   Ht 5' 2 (1.575 m)   Wt 85.7 kg   LMP 12/04/2018   SpO2 97%   BMI 34.57 kg/m   Physical Exam Vitals and nursing note reviewed.  Constitutional:      Appearance: She is obese. She is ill-appearing.  HENT:     Head: Normocephalic and atraumatic.     Comments: Oral thrush    Right Ear: External ear normal.     Left Ear: External ear normal.     Nose: Nose normal.     Mouth/Throat:     Mouth: Mucous membranes are dry.  Eyes:     Extraocular Movements: Extraocular movements intact.     Pupils: Pupils are equal, round, and reactive to light.  Cardiovascular:     Rate and Rhythm: Normal rate and regular rhythm.     Pulses: Normal pulses.     Heart sounds: Normal heart sounds.  Pulmonary:     Effort: Pulmonary effort is normal.     Breath sounds: Normal breath sounds.  Abdominal:  General: Abdomen is flat. Bowel sounds are normal.     Palpations: Abdomen is soft.  Musculoskeletal:     Cervical back: Normal range of motion and neck supple.  Neurological:     Mental Status: She is alert.     (all labs ordered are listed, but only abnormal results are displayed) Labs Reviewed  COMPREHENSIVE METABOLIC PANEL WITH GFR - Abnormal; Notable for the following components:      Result Value   Sodium 103 (*)    Potassium >7.5 (*)    Chloride <65 (*)    CO2 17 (*)    Glucose, Bld >1,200 (*)    BUN 60 (*)    Creatinine, Ser 2.72 (*)    Calcium 10.7 (*)    Total Protein 8.4 (*)    AST 14 (*)    ALT 48 (*)    Alkaline Phosphatase 177 (*)    GFR, Estimated 20 (*)    All other components within normal limits  CBC - Abnormal; Notable for the following components:   Hemoglobin 15.4 (*)    HCT 52.3 (*)    MCV 105.0 (*)    MCHC 29.4  (*)    All other components within normal limits  URINALYSIS, ROUTINE W REFLEX MICROSCOPIC - Abnormal; Notable for the following components:   Color, Urine COLORLESS (*)    Glucose, UA >1,000 (*)    Ketones, ur TRACE (*)    All other components within normal limits  CBG MONITORING, ED - Abnormal; Notable for the following components:   Glucose-Capillary >600 (*)    All other components within normal limits  I-STAT VENOUS BLOOD GAS, ED - Abnormal; Notable for the following components:   pO2, Ven 28 (*)    Sodium 104 (*)    Potassium 7.3 (*)    Calcium, Ion 1.10 (*)    HCT 51.0 (*)    Hemoglobin 17.3 (*)    All other components within normal limits  CBG MONITORING, ED - Abnormal; Notable for the following components:   Glucose-Capillary >600 (*)    All other components within normal limits  I-STAT VENOUS BLOOD GAS, ED - Abnormal; Notable for the following components:   pO2, Ven 28 (*)    Sodium 104 (*)    Potassium 7.2 (*)    Calcium, Ion 1.11 (*)    HCT 51.0 (*)    Hemoglobin 17.3 (*)    All other components within normal limits  CBG MONITORING, ED - Abnormal; Notable for the following components:   Glucose-Capillary >600 (*)    All other components within normal limits  RESP PANEL BY RT-PCR (RSV, FLU A&B, COVID)  [id number]  GROUP A STREP BY PCR  GROUP A STREP BY PCR  BETA-HYDROXYBUTYRIC ACID  OSMOLALITY    EKG: EKG Interpretation Date/Time:  Friday April 08 2024 11:14:00 EST Ventricular Rate:  65 PR Interval:  185 QRS Duration:  117 QT Interval:  398 QTC Calculation: 414 R Axis:   -15  Text Interpretation: Sinus rhythm Probable left atrial enlargement LVH with secondary repolarization abnormality No significant change since last tracing Confirmed by Dean Clarity [phone]) on 04/08/2024 11:39:30 AM  Radiology: [facility] Chest Portable 1 View Result Date: 04/08/2024 CLINICAL DATA:  Body aches and sore throat for several days. EXAM: PORTABLE CHEST 1 VIEW COMPARISON:   03/02/2023 FINDINGS: The heart size and mediastinal contours are within normal limits. Both lungs are clear. The visualized skeletal structures are unremarkable. IMPRESSION: No active disease. Electronically Signed  By: Norleen DELENA Kil M.D.   On: 04/08/2024 12:43     Procedures   Medications Ordered in the ED  insulin  regular, human (MYXREDLIN) 100 units/ 100 mL infusion (9.5 Units/hr Intravenous Rate/Dose Change 04/08/24 1302)  lactated ringers infusion ( Intravenous New Bag/Given 04/08/24 1324)  dextrose  5 % in lactated ringers infusion (has no administration in time range)  dextrose  50 % solution 0-50 mL (has no administration in time range)  sodium chloride  0.9 % bolus 1,000 mL (has no administration in time range)  clotrimazole (MYCELEX) troche 10 mg (has no administration in time range)  lactated ringers bolus 1,714 mL (0 mLs Intravenous Stopped 04/08/24 1326)  sodium chloride  0.9 % bolus 1,000 mL (1,000 mLs Intravenous New Bag/Given 04/08/24 1224)  sodium bicarbonate injection 50 mEq (50 mEq Intravenous Given 04/08/24 1253)                                    Medical Decision Making Amount and/or Complexity of Data Reviewed Labs: ordered. Radiology: ordered.  Risk Prescription drug management. Decision regarding hospitalization.   This patient presents to the ED for concern of weakness, sore throat, this involves an extensive number of treatment options, and is a complaint that carries with it a high risk of complications and morbidity.  The differential diagnosis includes strep, covid/flu/rsv, hyperglycemia, dehydration   Co morbidities that complicate the patient evaluation  HTN, GERD, multinodular goiter, and anemia   Additional history obtained:  Additional history obtained from epic chart review External records from outside source obtained and reviewed including family   Lab Tests:  I Ordered, and personally interpreted labs.  The pertinent results include:   cbc with hgb elevated at 15.4; cmp with na low at 103 (corrected 129), k >7.5, Cl <65, CO2 low at 17; glucose >1200; BUN elevated at 60, cr 2.72 (bun 27 and cr 0.89 on 03/04/23); ua + ketones, + glucose; VBG with nl pH; covid/flu/rsv neg; strep neg   Imaging Studies ordered:  I ordered imaging studies including cxr  I independently visualized and interpreted imaging which showed No active disease.  I agree with the radiologist interpretation   Cardiac Monitoring:  The patient was maintained on a cardiac monitor.  I personally viewed and interpreted the cardiac monitored which showed an underlying rhythm of: nsr   Medicines ordered and prescription drug management:  I ordered medication including ivfs/iv insulin   for sx  Reevaluation of the patient after these medicines showed that the patient improved I have reviewed the patients home medicines and have made adjustments as needed  Critical Interventions:  Iv insulin    Consultations Obtained:  I requested consultation with the hospitalist (Dr. Fausto),  and discussed lab and imaging findings as well as pertinent plan - she will admit   Problem List / ED Course:  HHS:  pt started on an insulin  drip/ivfs Hyperkalemia:  iv insulin /iv bicarb/ivfs.  K coming down with iv insulin  and ivfs.  No EKG changes.   Sore throat:  likely from oral and likely esophageal thrush.  Clotrimazole troches ordered   Reevaluation:  After the interventions noted above, I reevaluated the patient and found that they have :improved   Social Determinants of Health:  Lives at home   Dispostion:  After consideration of the diagnostic results and the patients response to treatment, I feel that the patent would benefit from admission.     CRITICAL CARE Performed  by: Mliss Boyers   Total critical care time: 45 minutes  Critical care time was exclusive of separately billable procedures and treating other patients.  Critical care was  necessary to treat or prevent imminent or life-threatening deterioration.  Critical care was time spent personally by me on the following activities: development of treatment plan with patient and/or surrogate as well as nursing, discussions with consultants, evaluation of patient's response to treatment, examination of patient, obtaining history from patient or surrogate, ordering and performing treatments and interventions, ordering and review of laboratory studies, ordering and review of radiographic studies, pulse oximetry and re-evaluation of patient's condition.      Final diagnoses:  Type 2 diabetes mellitus with hyperosmolar hyperglycemic state (HHS) (HCC)  Oral thrush    ED Discharge Orders     None          Boyers Mliss, MD 04/08/24 1407

## 2024-04-08 NOTE — ED Notes (Signed)
[facility] here to transport pt 

## 2024-04-08 NOTE — ED Triage Notes (Signed)
 Pt c/o weakness x4 days further reporting she has recently been treated for strep throat and currently on abx and steroid.

## 2024-04-08 NOTE — ED Provider Notes (Signed)
 [facility]    CSN: [id number] Arrival date & time: 04/08/24  0857      History   Chief Complaint Chief Complaint  Patient presents with   Generalized Body Aches    HPI [name] is a 53 y.o. female  presents for evaluation of URI symptoms for 5-7 days. Patient reports associated symptoms of sore throat, extreme body aches/muscle pain, nausea without vomiting, fatigue, weight loss, cough.  States the muscle pain is so bad she needs assistance to go to the bathroom.  Denies diarrhea, ear pain, shortness of breath, CP. Patient does not have a hx of asthma. Patient is not an active smoker.   Reports sick contacts via work.  Patient states she saw her PCP on 12/3 for same symptoms.  She states her rapid strep was negative but her PCP told her she had strep anyway and put her on amoxicillin prednisone  and Diflucan.  She also states she had chest x-ray and thinks it was normal.  She states she is been taking this with no improvement.  She also reports she started a statin a couple of days ago but has stopped it given her muscle pain.  States she had been on 1 in the past and could not tolerate due to muscle pain.  She reports she is only drinking fluids for at least 2 weeks and has caused her to have weight loss and her PCP is aware.  Pt has taken nothing OTC for symptoms. Pt has no other concerns at this time.   HPI  Past Medical History:  Diagnosis Date   Blood transfusion without reported diagnosis    GERD (gastroesophageal reflux disease)    Hypertension    Multinodular goiter     Patient Active Problem List   Diagnosis Date Noted   Hypertensive crisis 03/01/2023   Chronic heart failure with preserved ejection fraction (HFpEF) (HCC) 03/01/2023   Acute respiratory failure with hypoxia (HCC) 12/13/2018   Elevated troponin 12/13/2018   AKI (acute kidney injury) 12/13/2018   Goiter 12/13/2018   Anemia 12/13/2018   Hypokalemia 12/13/2018   Multinodular goiter  (nontoxic) 12/22/2015   Asthma 11/19/2015   Essential hypertension 08/29/2015   Gastroesophageal reflux disease without esophagitis 08/29/2015   Allergic rhinitis due to pollen 08/29/2015   Hepatitis B immune 02/24/2014   Acute on chronic heart failure with preserved ejection fraction (HFpEF) (HCC) 10/18/2013   Dyspnea 10/18/2013   Chest pain 10/18/2013   Cardiomegaly 08/23/2012   Fibroid, uterine 03/17/2012   Essential hypertension 02/02/2007    Past Surgical History:  Procedure Laterality Date   ENDOMETRIAL ABLATION      OB History   No obstetric history on file.      Home Medications    Prior to Admission medications  Medication Sig Start Date End Date Taking? Authorizing Provider  albuterol  (VENTOLIN  HFA) 108 (90 Base) MCG/ACT inhaler Inhale 2 puffs into the lungs every 6 (six) hours as needed for wheezing or shortness of breath. 03/04/23   Gherghe, Costin M, MD  amLODipine  (NORVASC ) 10 MG tablet Take 1 tablet (10 mg total) by mouth daily. 09/10/20   Freddi Hamilton, MD  aspirin  EC 81 MG tablet Take 81 mg by mouth daily.    [provider]  Blood Pressure KIT 1 each by Does not apply route daily after breakfast. 12/14/18   Lue Elsie BROCKS, MD  hydrALAZINE  (APRESOLINE ) 25 MG tablet Take 1 tablet (25 mg total) by mouth 2 (two) times daily. 03/04/23  Trixie Nilda HERO, MD  potassium chloride  SA (KLOR-CON ) 20 MEQ tablet Take 1 tablet (20 mEq total) by mouth daily. 09/10/20   Freddi Hamilton, MD  tetrahydrozoline-zinc (VISINE-AC) 0.05-0.25 % ophthalmic solution 2 drops 3 (three) times daily as needed.    [provider]  triamterene -hydrochlorothiazide  (DYAZIDE ) 37.5-25 MG capsule Take 1 each (1 capsule total) by mouth daily. 09/10/20   Freddi Hamilton, MD    Family History Family History  Problem Relation Age of Onset   Hypertension Mother    Hypertension Father    Diabetes Father    Diabetes Sister    Hypertension Sister    Hypertension Brother     Hypertension Son    Cancer Paternal Aunt    Diabetes Maternal Grandmother    Hypertension Maternal Grandmother    Diabetes Paternal Grandmother    Hypertension Paternal Grandmother    Diabetes Sister    Asthma Daughter     Social History Social History[1]   Allergies   Patient has no known allergies.   Review of Systems Review of Systems  Constitutional:  Positive for appetite change and fatigue.  HENT:  Positive for sore throat.   Respiratory:  Positive for cough.   Gastrointestinal:  Positive for nausea.  Musculoskeletal:  Positive for myalgias.     Physical Exam Triage Vital Signs ED Triage Vitals  Encounter Vitals Group     BP 04/08/24 0919 (!) 153/95     Girls Systolic BP Percentile --      Girls Diastolic BP Percentile --      Boys Systolic BP Percentile --      Boys Diastolic BP Percentile --      Pulse Rate 04/08/24 0919 77     Resp 04/08/24 0919 16     Temp 04/08/24 0919 98 F (36.7 C)     Temp src --      SpO2 04/08/24 0919 93 %     Weight --      Height --      Head Circumference --      Peak Flow --      Pain Score 04/08/24 0917 6     Pain Loc --      Pain Education --      Exclude from Growth Chart --    No data found.  Updated Vital Signs BP (!) 153/95   Pulse 77   Temp 98 F (36.7 C)   Resp 16   LMP 12/04/2018   SpO2 93%   Visual Acuity Right Eye Distance:   Left Eye Distance:   Bilateral Distance:    Right Eye Near:   Left Eye Near:    Bilateral Near:     Physical Exam Vitals and nursing note reviewed.  Constitutional:      General: She is not in acute distress.    Appearance: She is well-developed. She is not ill-appearing.  HENT:     Head: Normocephalic and atraumatic.     Right Ear: Tympanic membrane and ear canal normal.     Left Ear: Tympanic membrane and ear canal normal.     Nose: Congestion present.     Mouth/Throat:     Mouth: Mucous membranes are moist.     Pharynx: Oropharynx is clear. Uvula midline.  Posterior oropharyngeal erythema present.     Tonsils: No tonsillar exudate or tonsillar abscesses.  Eyes:     Conjunctiva/sclera: Conjunctivae normal.     Pupils: Pupils are equal, round, and reactive to light.  Cardiovascular:     Rate and Rhythm: Normal rate and regular rhythm.     Heart sounds: Normal heart sounds.  Pulmonary:     Effort: Pulmonary effort is normal.     Breath sounds: No wheezing, rhonchi or rales.  Musculoskeletal:     Cervical back: Normal range of motion and neck supple.  Lymphadenopathy:     Cervical: No cervical adenopathy.  Skin:    General: Skin is warm and dry.  Neurological:     General: No focal deficit present.     Mental Status: She is alert and oriented to person, place, and time.  Psychiatric:        Mood and Affect: Mood normal.        Behavior: Behavior normal.      UC Treatments / Results  Labs (all labs ordered are listed, but only abnormal results are displayed) Labs Reviewed  POCT RAPID STREP A (OFFICE)  POC COVID19/FLU A&B COMBO    EKG   Radiology No results found.  Procedures Procedures (including critical care time)  Medications Ordered in UC Medications  ondansetron  (ZOFRAN -ODT) disintegrating tablet 4 mg (4 mg Oral Given 04/08/24 0947)    Initial Impression / Assessment and Plan / UC Course  I have reviewed the triage vital signs and the nursing notes.  Pertinent labs & imaging results that were available during my care of the patient were reviewed by me and considered in my medical decision making (see chart for details).     Negative COVID and flu.  While awaiting results patient states she attempted to have her family take her to the restroom and she was too weak to stand and now she states she is having difficulty moving her legs and arms.  No unilateral weakness, chest pain, headache.  Given her progressing symptoms advise she go to the emergency room for further evaluation.  She is in agreement with plan will  go POV with her family driving her to the emergency room. Final Clinical Impressions(s) / UC Diagnoses   Final diagnoses:  Sore throat  Acute cough  Nausea  Muscle weakness     Discharge Instructions      Please go to the emergency room for further evaluation of your symptoms     ED Prescriptions   None    PDMP not reviewed this encounter.     [1]  Social History Tobacco Use   Smoking status: Never   Smokeless tobacco: Never  Substance Use Topics   Alcohol use: Yes    Alcohol/week: 10.0 standard drinks of alcohol    Types: 5 Cans of beer, 5 Shots of liquor per week    Comment: just on weekends   Drug use: No     Loreda Myla SAUNDERS, NP 04/08/24 1010

## 2024-04-08 NOTE — ED Triage Notes (Signed)
 Pt present with c/o sore throat, body aches x 5 days. Pt states she is currently taking abx for strep.

## 2024-04-09 DIAGNOSIS — E1100 Type 2 diabetes mellitus with hyperosmolarity without nonketotic hyperglycemic-hyperosmolar coma (NKHHC): Secondary | ICD-10-CM | POA: Diagnosis not present

## 2024-04-09 LAB — BASIC METABOLIC PANEL WITH GFR
Anion gap: 11 (ref 5–15)
BUN: 47 mg/dL — ABNORMAL HIGH (ref 6–20)
CO2: 28 mmol/L (ref 22–32)
Calcium: 9.9 mg/dL (ref 8.9–10.3)
Chloride: 94 mmol/L — ABNORMAL LOW (ref 98–111)
Creatinine, Ser: 1.61 mg/dL — ABNORMAL HIGH (ref 0.44–1.00)
GFR, Estimated: 38 mL/min — ABNORMAL LOW (ref >60)
Glucose, Bld: 220 mg/dL — ABNORMAL HIGH (ref 70–99)
Potassium: 3.2 mmol/L — ABNORMAL LOW (ref 3.5–5.1)
Sodium: 133 mmol/L — ABNORMAL LOW (ref 135–145)

## 2024-04-09 LAB — COMPREHENSIVE METABOLIC PANEL WITH GFR
ALT: 47 U/L — ABNORMAL HIGH (ref 0–44)
AST: 57 U/L — ABNORMAL HIGH (ref 15–41)
Albumin: 3.7 g/dL (ref 3.5–5.0)
Alkaline Phosphatase: 101 U/L (ref 38–126)
Anion gap: 10 (ref 5–15)
BUN: 45 mg/dL — ABNORMAL HIGH (ref 6–20)
CO2: 28 mmol/L (ref 22–32)
Calcium: 9.7 mg/dL (ref 8.9–10.3)
Chloride: 95 mmol/L — ABNORMAL LOW (ref 98–111)
Creatinine, Ser: 1.63 mg/dL — ABNORMAL HIGH (ref 0.44–1.00)
GFR, Estimated: 37 mL/min — ABNORMAL LOW (ref >60)
Glucose, Bld: 171 mg/dL — ABNORMAL HIGH (ref 70–99)
Potassium: 3.8 mmol/L (ref 3.5–5.1)
Sodium: 134 mmol/L — ABNORMAL LOW (ref 135–145)
Total Bilirubin: 0.9 mg/dL (ref 0.0–1.2)
Total Protein: 6.5 g/dL (ref 6.5–8.1)

## 2024-04-09 LAB — GLUCOSE, CAPILLARY
Glucose-Capillary: 171 mg/dL — ABNORMAL HIGH (ref 70–99)
Glucose-Capillary: 172 mg/dL — ABNORMAL HIGH (ref 70–99)
Glucose-Capillary: 176 mg/dL — ABNORMAL HIGH (ref 70–99)
Glucose-Capillary: 178 mg/dL — ABNORMAL HIGH (ref 70–99)
Glucose-Capillary: 179 mg/dL — ABNORMAL HIGH (ref 70–99)
Glucose-Capillary: 180 mg/dL — ABNORMAL HIGH (ref 70–99)
Glucose-Capillary: 181 mg/dL — ABNORMAL HIGH (ref 70–99)
Glucose-Capillary: 188 mg/dL — ABNORMAL HIGH (ref 70–99)
Glucose-Capillary: 188 mg/dL — ABNORMAL HIGH (ref 70–99)
Glucose-Capillary: 194 mg/dL — ABNORMAL HIGH (ref 70–99)
Glucose-Capillary: 200 mg/dL — ABNORMAL HIGH (ref 70–99)
Glucose-Capillary: 221 mg/dL — ABNORMAL HIGH (ref 70–99)
Glucose-Capillary: 254 mg/dL — ABNORMAL HIGH (ref 70–99)
Glucose-Capillary: 281 mg/dL — ABNORMAL HIGH (ref 70–99)
Glucose-Capillary: 434 mg/dL — ABNORMAL HIGH (ref 70–99)

## 2024-04-09 LAB — TSH: TSH: 0.387 u[IU]/mL (ref 0.350–4.500)

## 2024-04-09 LAB — BETA-HYDROXYBUTYRIC ACID
Beta-Hydroxybutyric Acid: 0.29 mmol/L — ABNORMAL HIGH (ref 0.05–0.27)
Beta-Hydroxybutyric Acid: 0.23 mmol/L (ref 0.05–0.27)

## 2024-04-09 LAB — GLUCOSE, RANDOM: Glucose, Bld: 391 mg/dL — ABNORMAL HIGH (ref 70–99)

## 2024-04-09 MED ORDER — POTASSIUM CHLORIDE CRYS ER 20 MEQ PO TBCR
40.0000 meq | EXTENDED_RELEASE_TABLET | Freq: Once | ORAL | Status: AC
  Administered 2024-04-09: 40 meq via ORAL
  Filled 2024-04-09: qty 2

## 2024-04-09 MED ORDER — INSULIN ASPART 100 UNIT/ML IJ SOLN
0.0000 [IU] | Freq: Three times a day (TID) | INTRAMUSCULAR | Status: DC
  Administered 2024-04-09: 8 [IU] via SUBCUTANEOUS
  Administered 2024-04-09: 3 [IU] via SUBCUTANEOUS
  Administered 2024-04-10 (×2): 15 [IU] via SUBCUTANEOUS
  Administered 2024-04-11 (×2): 5 [IU] via SUBCUTANEOUS
  Administered 2024-04-12: 08:00:00 3 [IU] via SUBCUTANEOUS
  Administered 2024-04-12: 12:00:00 5 [IU] via SUBCUTANEOUS
  Administered 2024-04-13: 08:00:00 2 [IU] via SUBCUTANEOUS
  Administered 2024-04-13: 12:00:00 3 [IU] via SUBCUTANEOUS
  Filled 2024-04-09: qty 3
  Filled 2024-04-09: qty 5
  Filled 2024-04-09: qty 15
  Filled 2024-04-09: qty 2
  Filled 2024-04-09: qty 15
  Filled 2024-04-09: qty 5
  Filled 2024-04-09: qty 6
  Filled 2024-04-09 (×2): qty 3
  Filled 2024-04-09: qty 5
  Filled 2024-04-09: qty 8

## 2024-04-09 MED ORDER — INSULIN ASPART 100 UNIT/ML IJ SOLN
0.0000 [IU] | Freq: Every day | INTRAMUSCULAR | Status: DC
  Administered 2024-04-09: 5 [IU] via SUBCUTANEOUS
  Administered 2024-04-10: 4 [IU] via SUBCUTANEOUS
  Administered 2024-04-11 – 2024-04-12 (×2): 2 [IU] via SUBCUTANEOUS
  Filled 2024-04-09: qty 2
  Filled 2024-04-09: qty 5
  Filled 2024-04-09: qty 4
  Filled 2024-04-09: qty 2

## 2024-04-09 MED ORDER — SODIUM CHLORIDE 0.9 % IV SOLN: INTRAVENOUS | Status: DC

## 2024-04-09 MED ORDER — ALUM & MAG HYDROXIDE-SIMETH 200-200-20 MG/5ML PO SUSP
15.0000 mL | Freq: Four times a day (QID) | ORAL | Status: DC | PRN
  Administered 2024-04-12: 18:00:00 15 mL via ORAL
  Filled 2024-04-09: qty 30

## 2024-04-09 MED ORDER — INSULIN GLARGINE 100 UNIT/ML ~~LOC~~ SOLN
30.0000 [IU] | Freq: Every day | SUBCUTANEOUS | Status: DC
  Administered 2024-04-09: 30 [IU] via SUBCUTANEOUS
  Filled 2024-04-09 (×2): qty 0.3

## 2024-04-09 MED ORDER — INSULIN ASPART 100 UNIT/ML IJ SOLN
5.0000 [IU] | Freq: Three times a day (TID) | INTRAMUSCULAR | Status: DC
  Administered 2024-04-09: 5 [IU] via SUBCUTANEOUS
  Filled 2024-04-09 (×2): qty 5

## 2024-04-09 MED ORDER — ENSURE PLUS HIGH PROTEIN PO LIQD
237.0000 mL | Freq: Two times a day (BID) | ORAL | Status: DC
  Administered 2024-04-10 – 2024-04-11 (×2): 237 mL via ORAL

## 2024-04-09 NOTE — Progress Notes (Signed)
 PROGRESS NOTE    [name]  FMW:[id number] DOB: [birth year] DOA: 04/08/2024 PCP: Jerome Heron Ruth, PA-C    Brief Narrative:   [name] is a 53 y.o. female with past medical history significant for HTN, prediabetes, obesity who presented to [facility] ED on 04/08/2024 with progressive weakness, fatigue, increased urination, nausea and now with difficulty ambulating.  Patient reports that initially started having decreased appetite, weakness starting during Thanksgiving and had been drinking protein shakes, and juices.  Recently seen by her PCP and thought that she had strep throat and was given a course of antibiotics and steroids which did not help.  Also was recently prescribed Ozempic but has yet to start.  No other changes in her medications, no sick contacts.  Denies headache, no chest pain, no shortness of breath, no abdominal pain, no fever/chills/night sweats, no vomiting/diarrhea, no focal weakness, no paresthesia.   In the ED, temperature 98.2 F, HR 71, RR 18, BP 123/90, SpO2 95% room air.  VBG pH 7.289, pCO2 54.1, pO2 28.  WBC 6.9, hemoglobin 15.4, platelet count 294.  Sodium 103, potassium greater than 7.5, chloride less than 65, CO2 17, glucose greater than 1200, BUN 60, creatinine 2.72.  AST 14, ALT 48, total bilirubin 1.1.  Hydroxybutyrate acid 3.74.  Urinalysis with trace ketones, negative leukocyte/nitrite, no bacteria.  Rapid strep a screen negative.  COVID/influenza/RSV PCR negative.  Chest x-ray with no active cardiopulmonary disease process.  Patient was given 2.7 L IVF bolus, started on insulin  drip, given sodium bicarb and injection.  TRH consulted for admission and patient was transferred to [facility] for further evaluation management of DKA, new onset diabetes.  Assessment & Plan:   Diabetic ketoacidosis Type 2 diabetes mellitus, new diagnosis Metabolic acidosis Patient presenting to the ED with progressive weakness, fatigue, nausea, decreased  appetite and increased urination.  Previous history of prediabetes with a hemoglobin A1c of 6.1 on 03/02/2023.  Complicated by recent initiation of oral prednisone  by PCP for presumed strep throat.  Although patient has been having progressive symptoms since late November 2025.  Patient with glucose greater than 1200, sodium 104, potassium 7.2, sodium HCO3 17.  VBG with pH 7.2.  Urinalysis with greater than 1000 glucose, trace ketones.  Beta hydroxybutyrate acid elevated 3.74. -- Diabetic educator consult: Pending -- Hemoglobin A1c: pending -- Transition insulin  drip to Lantus  30 units subcu as a daily -- NovoLog  5 units 3 times daily AC (if eating > 50% of meals -- Moderate SSI for coverage -- CBGs qAC/HS -- Insulin  administration teaching --Anticipate needs of further titration of subcutaneous insulin    Acute renal failure: Improving Creatinine elevated 2.72 on admission.  Previous baseline 0.89 on 03/04/2023.  Etiology likely secondary to prerenal azotemia in the setting of DKA/severe dehydration. -- Cr 2.72>>1.63 -- Hold home losartan  -- Continue IVF -- Avoid nephrotoxins, renally dose all medications -- BMP in the a.m. -- Monitor urine output   Hyperkalemia: Resolved Potassium 7.2 on admission, likely secondary to acute renal failure, DKA as above.  Initially started on insulin  drip now titrated off. -- K 7.2>>3.8 -- BMP daily   Hyponatremia: Improved Etiology likely multifactorial in the setting of poor oral intake with hypovolemic hyponatremia as well as pseudohyponatremia with elevated glucose of 1200.  Sodium correction for hyperglycemia = 130. -- Na 134 this am -- Continue glucose control, IVF -- BMP in a.m.   HTN At baseline on amlodipine  10 mg p.o. daily, losartan  milligrams p.o. daily, triamterene /HCTZ 75-50 mg  p.o. daily.   -- BP 111/72 this a.m. -- Hold all home antihypertensives at this time -- Hydralazine  10 mg IV every 6 hours as needed SBP greater than 170    Obesity, class I Body mass index is 32.94 kg/m.   DVT prophylaxis: heparin  injection 5,000 Units Start: 04/08/24 2200    Code Status: Full Code Family Communication: No family present at bedside this morning  Disposition Plan:  Level of care: Stepdown Status is: Inpatient Remains inpatient appropriate because: Transitioning insulin  drip to subcutaneous insulin  today; anticipate further titration needs, diabetic educator consult    Consultants:  None  Procedures:  None  Antimicrobials:  None   Subjective: Patient seen examined bedside, lying in bed.  Glucose improved, anion gap closed, transitioning insulin  drip to subcutaneous insulin  today.  Awaiting diabetic educator consult.  Patient continues to complain of mild cramping abdominal pain, although improved.  Requesting diet.  No other questions or concerns at this time.  Denies headache, no dizziness, no chest pain, no palpitations, no fever/chills/night sweats, no nausea/vomiting/diarrhea, no focal weakness, no paresthesias.  No acute events overnight per nursing staff.  Objective: Vitals:   04/09/24 1000 04/09/24 1100 04/09/24 1142 04/09/24 1200  BP:  106/70  120/74  Pulse:  (!) 56  (!) 54  Resp: 16 19  (!) 5  Temp:   (!) 97.5 F (36.4 C)   TempSrc:   Oral   SpO2:  97%  97%  Weight:      Height:        Intake/Output Summary (Last 24 hours) at 04/09/2024 1232 Last data filed at 04/09/2024 1156 Gross per 24 hour  Intake 2939.11 ml  Output 700 ml  Net 2239.11 ml   Filed Weights   04/08/24 1053 04/08/24 1609  Weight: 85.7 kg 81.7 kg    Examination:  Physical Exam: GEN: NAD, alert and oriented x 3, obese HEENT: NCAT, PERRL, EOMI, sclera clear, MMM PULM: CTAB w/o wheezes/crackles, normal respiratory effort, on room air CV: RRR w/o M/G/R GI: abd soft, NTND, NABS, no R/G/M MSK: no peripheral edema, muscle strength globally intact 5/5 bilateral upper/lower extremities NEURO: CN II-XII intact, no focal  deficits, sensation to light touch intact PSYCH: normal mood/affect Integumentary: dry/intact, no rashes or wounds    Data Reviewed: I have personally reviewed following labs and imaging studies  CBC: Recent Labs  Lab 04/08/24 1057 04/08/24 1255 04/08/24 1304 04/08/24 1834  WBC 6.9  --   --  9.0  HGB 15.4* 17.3* 17.3* 15.1*  HCT 52.3* 51.0* 51.0* 43.2  MCV 105.0*  --   --  86.6  PLT 294  --   --  290   Basic Metabolic Panel: Recent Labs  Lab 04/08/24 1637 04/08/24 1834 04/08/24 2117 04/09/24 0144 04/09/24 0830  NA 124* 127* 132* 133* 134*  K 4.7 4.0 3.7 3.2* 3.8  CL 84* 87* 91* 94* 95*  CO2 25 26 27 28 28   GLUCOSE 751* 558* 273* 220* 171*  BUN 49* 50* 49* 47* 45*  CREATININE 2.05* 1.82* 1.70* 1.61* 1.63*  CALCIUM 9.7 9.9 10.1 9.9 9.7   GFR: Estimated Creatinine Clearance: 39.1 mL/min (A) (by C-G formula based on SCr of 1.63 mg/dL (H)). Liver Function Tests: Recent Labs  Lab 04/08/24 1057 04/09/24 0830  AST 14* 57*  ALT 48* 47*  ALKPHOS 177* 101  BILITOT 1.1 0.9  PROT 8.4* 6.5  ALBUMIN 4.8 3.7   No results for input(s): LIPASE, AMYLASE in the last 168 hours. No  results for input(s): AMMONIA in the last 168 hours. Coagulation Profile: No results for input(s): INR, PROTIME in the last 168 hours. Cardiac Enzymes: No results for input(s): CKTOTAL, CKMB, CKMBINDEX, TROPONINI in the last 168 hours. BNP (last 3 results) No results for input(s): PROBNP in the last 8760 hours. HbA1C: No results for input(s): HGBA1C in the last 72 hours. CBG: Recent Labs  Lab 04/09/24 0739 04/09/24 0841 04/09/24 0941 04/09/24 1048 04/09/24 1145  GLUCAP 194* 171* 188* 200* 188*   Lipid Profile: No results for input(s): CHOL, HDL, LDLCALC, TRIG, CHOLHDL, LDLDIRECT in the last 72 hours. Thyroid  Function Tests: Recent Labs    04/09/24 0144  TSH 0.387   Anemia Panel: No results for input(s): VITAMINB12, FOLATE, FERRITIN, TIBC,  IRON, RETICCTPCT in the last 72 hours. Sepsis Labs: No results for input(s): PROCALCITON, LATICACIDVEN in the last 168 hours.  Recent Results (from the past 240 hours)  Resp panel by RT-PCR (RSV, Flu A&B, Covid) Anterior Nasal Swab     Status: None   Collection Time: 04/08/24 12:28 PM   Specimen: Anterior Nasal Swab  Result Value Ref Range Status   SARS Coronavirus 2 by RT PCR NEGATIVE NEGATIVE Final    Comment: (NOTE) SARS-CoV-2 target nucleic acids are NOT DETECTED.  The SARS-CoV-2 RNA is generally detectable in upper respiratory specimens during the acute phase of infection. The lowest concentration of SARS-CoV-2 viral copies this assay can detect is 138 copies/mL. A negative result does not preclude SARS-Cov-2 infection and should not be used as the sole basis for treatment or other patient management decisions. A negative result may occur with  improper specimen collection/handling, submission of specimen other than nasopharyngeal swab, presence of viral mutation(s) within the areas targeted by this assay, and inadequate number of viral copies(<138 copies/mL). A negative result must be combined with clinical observations, patient history, and epidemiological information. The expected result is Negative.  Fact Sheet for Patients:  bloggercourse.com  Fact Sheet for Healthcare Providers:  seriousbroker.it  This test is no t yet approved or cleared by the [place]  FDA and  has been authorized for detection and/or diagnosis of SARS-CoV-2 by FDA under an Emergency Use Authorization (EUA). This EUA will remain  in effect (meaning this test can be used) for the duration of the COVID-19 declaration under Section 564(b)(1) of the Act, 21 U.S.C.section 360bbb-3(b)(1), unless the authorization is terminated  or revoked sooner.       Influenza A by PCR NEGATIVE NEGATIVE Final   Influenza B by PCR NEGATIVE NEGATIVE Final     Comment: (NOTE) The Xpert Xpress SARS-CoV-2/FLU/RSV plus assay is intended as an aid in the diagnosis of influenza from Nasopharyngeal swab specimens and should not be used as a sole basis for treatment. Nasal washings and aspirates are unacceptable for Xpert Xpress SARS-CoV-2/FLU/RSV testing.  Fact Sheet for Patients: bloggercourse.com  Fact Sheet for Healthcare Providers: seriousbroker.it  This test is not yet approved or cleared by the [place]  FDA and has been authorized for detection and/or diagnosis of SARS-CoV-2 by FDA under an Emergency Use Authorization (EUA). This EUA will remain in effect (meaning this test can be used) for the duration of the COVID-19 declaration under Section 564(b)(1) of the Act, 21 U.S.C. section 360bbb-3(b)(1), unless the authorization is terminated or revoked.     Resp Syncytial Virus by PCR NEGATIVE NEGATIVE Final    Comment: (NOTE) Fact Sheet for Patients: bloggercourse.com  Fact Sheet for Healthcare Providers: seriousbroker.it  This test is not yet approved  or cleared by the [place]  FDA and has been authorized for detection and/or diagnosis of SARS-CoV-2 by FDA under an Emergency Use Authorization (EUA). This EUA will remain in effect (meaning this test can be used) for the duration of the COVID-19 declaration under Section 564(b)(1) of the Act, 21 U.S.C. section 360bbb-3(b)(1), unless the authorization is terminated or revoked.  Performed at [facility], [address] [place], [place] [address]   Group A Strep by PCR     Status: None   Collection Time: 04/08/24 12:33 PM  Result Value Ref Range Status   Group A Strep by PCR NOT DETECTED NOT DETECTED Final    Comment: Performed at [facility], [address], [place], [place] [address]  MRSA Next Gen by PCR, Nasal     Status: None    Collection Time: 04/08/24  4:20 PM   Specimen: Nasal Mucosa; Nasal Swab  Result Value Ref Range Status   MRSA by PCR Next Gen NOT DETECTED NOT DETECTED Final    Comment: (NOTE) The GeneXpert MRSA Assay (FDA approved for NASAL specimens only), is one component of a comprehensive MRSA colonization surveillance program. It is not intended to diagnose MRSA infection nor to guide or monitor treatment for MRSA infections. Test performance is not FDA approved in patients less than 8 years old. Performed at [facility], [address] [place], [place] [address]          Radiology Studies: DG Chest Portable 1 View Result Date: 04/08/2024 CLINICAL DATA:  Body aches and sore throat for several days. EXAM: PORTABLE CHEST 1 VIEW COMPARISON:  03/02/2023 FINDINGS: The heart size and mediastinal contours are within normal limits. Both lungs are clear. The visualized skeletal structures are unremarkable. IMPRESSION: No active disease. Electronically Signed   By: Norleen DELENA Kil M.D.   On: 04/08/2024 12:43        Scheduled Meds:  Chlorhexidine  Gluconate Cloth  6 each Topical Daily   clotrimazole   10 mg Oral 5 X Daily   feeding supplement  237 mL Oral BID BM   heparin   5,000 Units Subcutaneous Q8H   insulin  aspart  0-15 Units Subcutaneous TID WC   insulin  aspart  0-5 Units Subcutaneous QHS   insulin  aspart  5 Units Subcutaneous TID WC   insulin  glargine  30 Units Subcutaneous Daily   Continuous Infusions:  dextrose  5% lactated ringers  125 mL/hr at 04/09/24 1156   insulin  5.5 Units/hr (04/09/24 1156)   lactated ringers  Stopped (04/08/24 2232)     LOS: 1 day    Time spent: 52 minutes spent on 04/09/2024 caring for this patient face-to-face including chart review, ordering labs/tests, documenting, discussion with nursing staff, consultants, updating family and interview/physical exam    Camellia PARAS Preesha Benjamin, DO [facility] Available via Epic secure chat  7am-7pm After these hours, please refer to coverage provider listed on [email or web address] 04/09/2024, 12:32 PM

## 2024-04-09 NOTE — Plan of Care (Signed)
°  Problem: Clinical Measurements: Goal: Diagnostic test results will improve Outcome: Progressing Goal: Respiratory complications will improve Outcome: Progressing Goal: Cardiovascular complication will be avoided Outcome: Progressing   Problem: Pain Managment: Goal: General experience of comfort will improve and/or be controlled Outcome: Progressing   Problem: Education: Goal: Ability to describe self-care measures that may prevent or decrease complications (Diabetes Survival Skills Education) will improve Outcome: Not Progressing Goal: Individualized Educational Video(s) Outcome: Not Progressing

## 2024-04-10 DIAGNOSIS — B370 Candidal stomatitis: Secondary | ICD-10-CM

## 2024-04-10 DIAGNOSIS — E1100 Type 2 diabetes mellitus with hyperosmolarity without nonketotic hyperglycemic-hyperosmolar coma (NKHHC): Principal | ICD-10-CM | POA: Diagnosis present

## 2024-04-10 DIAGNOSIS — E66813 Obesity, class 3: Secondary | ICD-10-CM | POA: Insufficient documentation

## 2024-04-10 DIAGNOSIS — E875 Hyperkalemia: Secondary | ICD-10-CM

## 2024-04-10 DIAGNOSIS — R7989 Other specified abnormal findings of blood chemistry: Secondary | ICD-10-CM | POA: Insufficient documentation

## 2024-04-10 LAB — COMPREHENSIVE METABOLIC PANEL WITH GFR
ALT: 61 U/L — ABNORMAL HIGH (ref 0–44)
AST: 66 U/L — ABNORMAL HIGH (ref 15–41)
Albumin: 3.5 g/dL (ref 3.5–5.0)
Alkaline Phosphatase: 101 U/L (ref 38–126)
Anion gap: 11 (ref 5–15)
BUN: 29 mg/dL — ABNORMAL HIGH (ref 6–20)
CO2: 26 mmol/L (ref 22–32)
Calcium: 9 mg/dL (ref 8.9–10.3)
Chloride: 96 mmol/L — ABNORMAL LOW (ref 98–111)
Creatinine, Ser: 1.17 mg/dL — ABNORMAL HIGH (ref 0.44–1.00)
GFR, Estimated: 55 mL/min — ABNORMAL LOW (ref >60)
Glucose, Bld: 382 mg/dL — ABNORMAL HIGH (ref 70–99)
Potassium: 3.5 mmol/L (ref 3.5–5.1)
Sodium: 132 mmol/L — ABNORMAL LOW (ref 135–145)
Total Bilirubin: 0.7 mg/dL (ref 0.0–1.2)
Total Protein: 6.1 g/dL — ABNORMAL LOW (ref 6.5–8.1)

## 2024-04-10 LAB — GLUCOSE, CAPILLARY
Glucose-Capillary: 404 mg/dL — ABNORMAL HIGH (ref 70–99)
Glucose-Capillary: 372 mg/dL — ABNORMAL HIGH (ref 70–99)
Glucose-Capillary: 461 mg/dL — ABNORMAL HIGH (ref 70–99)
Glucose-Capillary: 304 mg/dL — ABNORMAL HIGH (ref 70–99)

## 2024-04-10 MED ORDER — INSULIN GLARGINE 100 UNIT/ML ~~LOC~~ SOLN: 45.0000 [IU] | Freq: Two times a day (BID) | SUBCUTANEOUS | Status: DC

## 2024-04-10 MED ORDER — INSULIN ASPART 100 UNIT/ML IJ SOLN
8.0000 [IU] | Freq: Three times a day (TID) | INTRAMUSCULAR | Status: DC
  Administered 2024-04-10 (×3): 8 [IU] via SUBCUTANEOUS
  Filled 2024-04-10 (×3): qty 8

## 2024-04-10 MED ORDER — POTASSIUM CHLORIDE CRYS ER 20 MEQ PO TBCR
40.0000 meq | EXTENDED_RELEASE_TABLET | Freq: Two times a day (BID) | ORAL | Status: AC
  Administered 2024-04-10 (×2): 40 meq via ORAL
  Filled 2024-04-10 (×2): qty 2

## 2024-04-10 MED ORDER — INSULIN GLARGINE 100 UNIT/ML ~~LOC~~ SOLN
50.0000 [IU] | Freq: Two times a day (BID) | SUBCUTANEOUS | Status: DC
  Administered 2024-04-10: 50 [IU] via SUBCUTANEOUS
  Filled 2024-04-10 (×2): qty 0.5

## 2024-04-10 MED ORDER — SODIUM CHLORIDE 0.9 % IV SOLN: INTRAVENOUS | Status: DC

## 2024-04-10 MED ORDER — INSULIN ASPART 100 UNIT/ML IJ SOLN
20.0000 [IU] | Freq: Once | INTRAMUSCULAR | Status: AC
  Administered 2024-04-10: 20 [IU] via SUBCUTANEOUS
  Filled 2024-04-10: qty 20

## 2024-04-10 MED ORDER — INSULIN GLARGINE 100 UNIT/ML ~~LOC~~ SOLN
30.0000 [IU] | Freq: Two times a day (BID) | SUBCUTANEOUS | Status: DC
  Administered 2024-04-10: 30 [IU] via SUBCUTANEOUS
  Filled 2024-04-10 (×2): qty 0.3

## 2024-04-10 NOTE — Progress Notes (Signed)
 Blood sugar stick was 434; notified provider- Laneta BARE, NP at the time of the result and that the nurse had ordered a stat glucose to be drawn from the lab. She wished to wait until the glucose results from lab draw. The result from it was 391. Informed her of this. Orders to cover based on bedtime sliding scale as ordered.

## 2024-04-10 NOTE — Evaluation (Signed)
 Physical Therapy Evaluation Patient Details Name: [name] MRN: [id number] DOB: [birth year] Today's Date: 04/10/2024  History of Present Illness  Pt admitted from home with c/o weakness/fatigue, dizziness and inability to ambulate.  Pt new dx DM with DKA, metabolic acidosis, acute renal failure, hypokalemia and hyponatremia.  Pt with hx of htn.  Clinical Impression  Pt admitted as above and presenting with functional mobility limitations 2* generalized weakness, balance deficits and decreased activity tolerance.  Pt very cooperative and hopes to progress to dc home with assist of family.        If plan is discharge home, recommend the following:     Can travel by private vehicle        Equipment Recommendations Rolling walker (2 wheels)  Recommendations for Other Services       Functional Status Assessment       Precautions / Restrictions Precautions Precautions: Fall Restrictions Weight Bearing Restrictions Per Provider Order: No      Mobility  Bed Mobility               General bed mobility comments: Pt up in chair and returns to same    Transfers Overall transfer level: Needs assistance Equipment used: Rolling walker (2 wheels) Transfers: Sit to/from Stand Sit to Stand: Min assist           General transfer comment: Increased time, steady assist, cues for use of UEs to self assist    Ambulation/Gait Ambulation/Gait assistance: Contact guard assist Gait Distance (Feet): 130 Feet Assistive device: Rolling walker (2 wheels) Gait Pattern/deviations: Step-to pattern, Step-through pattern, Decreased step length - right, Decreased step length - left, Shuffle, Trunk flexed Gait velocity: decr     General Gait Details: increased time, steady assist, cues for sequence, posture and position from [employer]            Wheelchair Mobility     Tilt Bed    Modified Rankin (Stroke Patients Only)       Balance Overall balance assessment:  Needs assistance Sitting-balance support: No upper extremity supported, Feet supported Sitting balance-Leahy Scale: Good     Standing balance support: Bilateral upper extremity supported Standing balance-Leahy Scale: Poor                               Pertinent Vitals/Pain Pain Assessment Pain Assessment: No/denies pain    Home Living Family/patient expects to be discharged to:: Private residence Living Arrangements: Children Available Help at Discharge: Family;Available PRN/intermittently (lives with dtr who works) Type of Home: Apartment Home Access: Stairs to enter Entrance Stairs-Rails: Doctor, General Practice of Steps: 4+4   Home Layout: One level Home Equipment: None      Prior Function Prior Level of Function : Independent/Modified Independent                     Extremity/Trunk Assessment   Upper Extremity Assessment Upper Extremity Assessment: Generalized weakness    Lower Extremity Assessment Lower Extremity Assessment: Generalized weakness       Communication   Communication Communication: No apparent difficulties    Cognition Arousal: Alert Behavior During Therapy: WFL for tasks assessed/performed, Flat affect   PT - Cognitive impairments: No apparent impairments                         Following commands: Intact       Cueing Cueing Techniques:  Verbal cues     General Comments      Exercises     Assessment/Plan    PT Assessment Patient needs continued PT services  PT Problem List Decreased strength;Decreased activity tolerance;Decreased balance;Decreased mobility;Decreased knowledge of use of DME       PT Treatment Interventions DME instruction;Gait training;Stair training;Functional mobility training;Therapeutic activities;Therapeutic exercise;Balance training;Patient/family education    PT Goals (Current goals can be found in the Care Plan section)  Acute Rehab PT Goals Patient Stated  Goal: Regain IND PT Goal Formulation: With patient Time For Goal Achievement: 04/24/24 Potential to Achieve Goals: Good    Frequency Min 2X/week     Co-evaluation               AM-PAC PT 6 Clicks Mobility  Outcome Measure Help needed turning from your back to your side while in a flat bed without using bedrails?: A Little Help needed moving from lying on your back to sitting on the side of a flat bed without using bedrails?: A Little Help needed moving to and from a bed to a chair (including a wheelchair)?: A Little Help needed standing up from a chair using your arms (e.g., wheelchair or bedside chair)?: A Little Help needed to walk in hospital room?: A Little Help needed climbing 3-5 steps with a railing? : A Lot 6 Click Score: 17    End of Session Equipment Utilized During Treatment: Gait belt Activity Tolerance: Patient tolerated treatment well;Patient limited by fatigue Patient left: in chair;with call bell/phone within reach;with nursing/sitter in room Nurse Communication: Mobility status PT Visit Diagnosis: Unsteadiness on feet (R26.81);Muscle weakness (generalized) (M62.81);Difficulty in walking, not elsewhere classified (R26.2)    Time: 8979-8962 PT Time Calculation (min) (ACUTE ONLY): 17 min   Charges:                 [facility] PT Acute Rehabilitation Services Office [phone]   Imir Brumbach 04/10/2024, 10:53 AM

## 2024-04-10 NOTE — Progress Notes (Signed)
 Patient's CBG was 461 this evening MD Feliz notified and given  novolog  insulin  as per order.

## 2024-04-10 NOTE — Progress Notes (Signed)
 [facility] PROGRESS NOTE    Progress Note  [name]  FMW:[id number] DOB: 08-09-1970 DOA: 04/08/2024 PCP: Jerome Heron Ruth, PA-C     Brief Narrative:   [name] is an 53 y.o. female past medical history of essential hypertension prediabetes obesity comes in with increased urination nausea was found to have a blood glucose of greater than 1200 and DKA.  Assessment/Plan:   Hyperosmolar hyperglycemic state: Started on IV insulin  and IV fluids now transition to long-acting insulin  plus sliding scale. Her blood glucose remained greater than 350 will double her long-acting insulin  and meal coverage. Consult diabetes educator. Check an A1c. She will need to go home on insulin . Consult diabetes educator.  Acute kidney injury: Likely prerenal azotemia with a baseline creatinine of less than 1. On admission 1.7 started on IV fluids her creatinine is improving. Continue IV fluids for an additional 24 hours.  Hypokalemia: Likely due to acidosis now resolved.  Pseudohyponatremia: Improved once her blood sugar is controlled this will continue to improve.  Essential hypertension: Blood pressure is relatively well-controlled. Continue to hold antihypertensive medication to allow renal perfusion.  Obesity, Class III, BMI 40-49.9 (morbid obesity) (HCC) Morbid obesity: Noted.    DVT prophylaxis: lovenox  Family Communication:none Status is: Inpatient Remains inpatient appropriate because: Hyperglycemic hyperosmolar nonketotic state.    Code Status:     Code Status Orders  (From admission, onward)           Start     Ordered   04/08/24 1702  Full code  Continuous       Question:  By:  Answer:  Consent: discussion documented in EHR   04/08/24 1706           Code Status History     Date Active Date Inactive Code Status Order ID Comments User Context   03/01/2023 2346 03/04/2023 2027 Full Code [id number]  Charlton Evalene RAMAN, MD ED   12/14/2018 0519  12/14/2018 1534 Full Code [id number]  Silvester Ales, MD ED   10/18/2013 0749 10/19/2013 1458 Full Code [id number]  Juvenal Harlene PENNER, DO Inpatient         IV Access:   Peripheral IV   Procedures and diagnostic studies:   No results found.   Medical Consultants:   None.   Subjective:    [name] she relates she feels better but not back to baseline.  Objective:    Vitals:   04/09/24 1500 04/09/24 1531 04/09/24 2124 04/10/24 0541  BP: 128/81 123/79 135/84 129/87  Pulse: 61 61 63 (!) 56  Resp: 12 18 17 18   Temp:  98.5 F (36.9 C) 98 F (36.7 C) 97.6 F (36.4 C)  TempSrc:  Oral Oral Oral  SpO2: 96% 97% 99% 100%  Weight:      Height:       SpO2: 100 %   Intake/Output Summary (Last 24 hours) at 04/10/2024 1254 Last data filed at 04/10/2024 0928 Gross per 24 hour  Intake 1798.57 ml  Output 2550 ml  Net -751.43 ml   Filed Weights   04/08/24 1053 04/08/24 1609  Weight: 85.7 kg 81.7 kg    Exam: General exam: In no acute distress. Respiratory system: Good air movement and clear to auscultation. Cardiovascular system: S1 & S2 heard, RRR. No JVD. Gastrointestinal system: Abdomen is nondistended, soft and nontender.  Extremities: No pedal edema. Skin: No rashes, lesions or ulcers Psychiatry: Judgement and insight appear normal. Mood & affect appropriate.    Data Reviewed:  Labs: Basic Metabolic Panel: Recent Labs  Lab 04/08/24 1834 04/08/24 2117 04/09/24 0144 04/09/24 0830 04/09/24 2147 04/10/24 0526  NA 127* 132* 133* 134*  --  132*  K 4.0 3.7 3.2* 3.8  --  3.5  CL 87* 91* 94* 95*  --  96*  CO2 26 27 28 28   --  26  GLUCOSE 558* 273* 220* 171* 391* 382*  BUN 50* 49* 47* 45*  --  29*  CREATININE 1.82* 1.70* 1.61* 1.63*  --  1.17*  CALCIUM 9.9 10.1 9.9 9.7  --  9.0   GFR Estimated Creatinine Clearance: 54.4 mL/min (A) (by C-G formula based on SCr of 1.17 mg/dL (H)). Liver Function Tests: Recent Labs  Lab 04/08/24 1057  04/09/24 0830 04/10/24 0526  AST 14* 57* 66*  ALT 48* 47* 61*  ALKPHOS 177* 101 101  BILITOT 1.1 0.9 0.7  PROT 8.4* 6.5 6.1*  ALBUMIN 4.8 3.7 3.5   No results for input(s): LIPASE, AMYLASE in the last 168 hours. No results for input(s): AMMONIA in the last 168 hours. Coagulation profile No results for input(s): INR, PROTIME in the last 168 hours. COVID-19 Labs  No results for input(s): DDIMER, FERRITIN, LDH, CRP in the last 72 hours.  Lab Results  Component Value Date   SARSCOV2NAA NEGATIVE 04/08/2024   SARSCOV2NAA NEGATIVE 03/01/2023   SARSCOV2NAA NEGATIVE 12/13/2018    CBC: Recent Labs  Lab 04/08/24 1057 04/08/24 1255 04/08/24 1304 04/08/24 1834  WBC 6.9  --   --  9.0  HGB 15.4* 17.3* 17.3* 15.1*  HCT 52.3* 51.0* 51.0* 43.2  MCV 105.0*  --   --  86.6  PLT 294  --   --  290   Cardiac Enzymes: No results for input(s): CKTOTAL, CKMB, CKMBINDEX, TROPONINI in the last 168 hours. BNP (last 3 results) No results for input(s): PROBNP in the last 8760 hours. CBG: Recent Labs  Lab 04/09/24 1306 04/09/24 1642 04/09/24 2121 04/10/24 0756 04/10/24 1115  GLUCAP 181* 281* 434* 404* 372*   D-Dimer: No results for input(s): DDIMER in the last 72 hours. Hgb A1c: No results for input(s): HGBA1C in the last 72 hours. Lipid Profile: No results for input(s): CHOL, HDL, LDLCALC, TRIG, CHOLHDL, LDLDIRECT in the last 72 hours. Thyroid  function studies: Recent Labs    04/09/24 0144  TSH 0.387   Anemia work up: No results for input(s): VITAMINB12, FOLATE, FERRITIN, TIBC, IRON, RETICCTPCT in the last 72 hours. Sepsis Labs: Recent Labs  Lab 04/08/24 1057 04/08/24 1834  WBC 6.9 9.0   Microbiology Recent Results (from the past 240 hours)  Resp panel by RT-PCR (RSV, Flu A&B, Covid) Anterior Nasal Swab     Status: None   Collection Time: 04/08/24 12:28 PM   Specimen: Anterior Nasal Swab  Result Value Ref Range  Status   SARS Coronavirus 2 by RT PCR NEGATIVE NEGATIVE Final    Comment: (NOTE) SARS-CoV-2 target nucleic acids are NOT DETECTED.  The SARS-CoV-2 RNA is generally detectable in upper respiratory specimens during the acute phase of infection. The lowest concentration of SARS-CoV-2 viral copies this assay can detect is 138 copies/mL. A negative result does not preclude SARS-Cov-2 infection and should not be used as the sole basis for treatment or other patient management decisions. A negative result may occur with  improper specimen collection/handling, submission of specimen other than nasopharyngeal swab, presence of viral mutation(s) within the areas targeted by this assay, and inadequate number of viral copies(<138 copies/mL). A negative result must  be combined with clinical observations, patient history, and epidemiological information. The expected result is Negative.  Fact Sheet for Patients:  bloggercourse.com  Fact Sheet for Healthcare Providers:  seriousbroker.it  This test is no t yet approved or cleared by the [place]  FDA and  has been authorized for detection and/or diagnosis of SARS-CoV-2 by FDA under an Emergency Use Authorization (EUA). This EUA will remain  in effect (meaning this test can be used) for the duration of the COVID-19 declaration under Section 564(b)(1) of the Act, 21 U.S.C.section 360bbb-3(b)(1), unless the authorization is terminated  or revoked sooner.       Influenza A by PCR NEGATIVE NEGATIVE Final   Influenza B by PCR NEGATIVE NEGATIVE Final    Comment: (NOTE) The Xpert Xpress SARS-CoV-2/FLU/RSV plus assay is intended as an aid in the diagnosis of influenza from Nasopharyngeal swab specimens and should not be used as a sole basis for treatment. Nasal washings and aspirates are unacceptable for Xpert Xpress SARS-CoV-2/FLU/RSV testing.  Fact Sheet for  Patients: bloggercourse.com  Fact Sheet for Healthcare Providers: seriousbroker.it  This test is not yet approved or cleared by the [place]  FDA and has been authorized for detection and/or diagnosis of SARS-CoV-2 by FDA under an Emergency Use Authorization (EUA). This EUA will remain in effect (meaning this test can be used) for the duration of the COVID-19 declaration under Section 564(b)(1) of the Act, 21 U.S.C. section 360bbb-3(b)(1), unless the authorization is terminated or revoked.     Resp Syncytial Virus by PCR NEGATIVE NEGATIVE Final    Comment: (NOTE) Fact Sheet for Patients: bloggercourse.com  Fact Sheet for Healthcare Providers: seriousbroker.it  This test is not yet approved or cleared by the [place]  FDA and has been authorized for detection and/or diagnosis of SARS-CoV-2 by FDA under an Emergency Use Authorization (EUA). This EUA will remain in effect (meaning this test can be used) for the duration of the COVID-19 declaration under Section 564(b)(1) of the Act, 21 U.S.C. section 360bbb-3(b)(1), unless the authorization is terminated or revoked.  Performed at [facility], [address] [place], [place] [address]   Group A Strep by PCR     Status: None   Collection Time: 04/08/24 12:33 PM  Result Value Ref Range Status   Group A Strep by PCR NOT DETECTED NOT DETECTED Final    Comment: Performed at [facility], [address] [place], [place] [address]  MRSA Next Gen by PCR, Nasal     Status: None   Collection Time: 04/08/24  4:20 PM   Specimen: Nasal Mucosa; Nasal Swab  Result Value Ref Range Status   MRSA by PCR Next Gen NOT DETECTED NOT DETECTED Final    Comment: (NOTE) The GeneXpert MRSA Assay (FDA approved for NASAL specimens only), is one component of a comprehensive MRSA colonization  surveillance program. It is not intended to diagnose MRSA infection nor to guide or monitor treatment for MRSA infections. Test performance is not FDA approved in patients less than 30 years old. Performed at [facility], [address], [place], [place] [address]      Medications:    Chlorhexidine  Gluconate Cloth  6 each Topical Daily   clotrimazole   10 mg Oral 5 X Daily   feeding supplement  237 mL Oral BID BM   heparin   5,000 Units Subcutaneous Q8H   insulin  aspart  0-15 Units Subcutaneous TID WC   insulin  aspart  0-5 Units Subcutaneous QHS   insulin  aspart  8 Units Subcutaneous TID WC   insulin  glargine  30 Units Subcutaneous BID   Continuous Infusions:  sodium chloride  75 mL/hr at 04/10/24 0343   insulin  Stopped (04/09/24 1305)      LOS: 2 days   Erle Odell Castor  [facility]  04/10/2024, 12:54 PM

## 2024-04-10 NOTE — Progress Notes (Signed)
 Patient's CBG was 404 this morning MD [facility] notified and given Insulin  as per order.

## 2024-04-11 ENCOUNTER — Telehealth (HOSPITAL_COMMUNITY): Payer: Self-pay

## 2024-04-11 ENCOUNTER — Other Ambulatory Visit (HOSPITAL_COMMUNITY): Payer: Self-pay

## 2024-04-11 LAB — BASIC METABOLIC PANEL WITH GFR
Anion gap: 6 (ref 5–15)
BUN: 18 mg/dL (ref 6–20)
CO2: 28 mmol/L (ref 22–32)
Calcium: 9.4 mg/dL (ref 8.9–10.3)
Chloride: 103 mmol/L (ref 98–111)
Creatinine, Ser: 0.86 mg/dL (ref 0.44–1.00)
GFR, Estimated: >60 mL/min (ref >60)
Glucose, Bld: 94 mg/dL (ref 70–99)
Potassium: 3.9 mmol/L (ref 3.5–5.1)
Sodium: 138 mmol/L (ref 135–145)

## 2024-04-11 LAB — GLUCOSE, CAPILLARY
Glucose-Capillary: 102 mg/dL — ABNORMAL HIGH (ref 70–99)
Glucose-Capillary: 224 mg/dL — ABNORMAL HIGH (ref 70–99)
Glucose-Capillary: 222 mg/dL — ABNORMAL HIGH (ref 70–99)
Glucose-Capillary: 234 mg/dL — ABNORMAL HIGH (ref 70–99)
Glucose-Capillary: 221 mg/dL — ABNORMAL HIGH (ref 70–99)

## 2024-04-11 LAB — CBG MONITORING, ED: Glucose-Capillary: >600 mg/dL (ref 70–99)

## 2024-04-11 LAB — HEMOGLOBIN A1C
Hgb A1c MFr Bld: >15.5 % — ABNORMAL HIGH (ref 4.8–5.6)
Mean Plasma Glucose: >398 mg/dL

## 2024-04-11 MED ORDER — INSULIN STARTER KIT- PEN NEEDLES (ENGLISH)
1.0000 | Freq: Once | Status: DC
  Filled 2024-04-11: qty 1

## 2024-04-11 MED ORDER — INSULIN GLARGINE 100 UNIT/ML ~~LOC~~ SOLN
40.0000 [IU] | Freq: Two times a day (BID) | SUBCUTANEOUS | Status: DC
  Administered 2024-04-11 – 2024-04-12 (×3): 40 [IU] via SUBCUTANEOUS
  Filled 2024-04-11 (×3): qty 0.4

## 2024-04-11 MED ORDER — LIVING WELL WITH DIABETES BOOK
Freq: Once | Status: DC
  Filled 2024-04-11: qty 1

## 2024-04-11 MED ORDER — LISINOPRIL 5 MG PO TABS
5.0000 mg | ORAL_TABLET | Freq: Every day | ORAL | Status: DC
  Filled 2024-04-11 (×2): qty 1

## 2024-04-11 MED ORDER — INSULIN ASPART 100 UNIT/ML IJ SOLN
6.0000 [IU] | Freq: Three times a day (TID) | INTRAMUSCULAR | Status: DC
  Administered 2024-04-11 – 2024-04-13 (×8): 6 [IU] via SUBCUTANEOUS
  Filled 2024-04-11 (×7): qty 6

## 2024-04-11 MED ORDER — INSULIN ASPART 100 UNIT/ML IJ SOLN: 6.0000 [IU] | Freq: Three times a day (TID) | INTRAMUSCULAR | Status: DC

## 2024-04-11 NOTE — Progress Notes (Signed)
°   04/11/24 1012  TOC Brief Assessment  Insurance and Status Reviewed  Patient has primary care physician Yes  Home environment has been reviewed Apartment  Prior level of function: Independent  Prior/Current Home Services No current home services  Social Drivers of Health Review SDOH reviewed no interventions necessary  Readmission risk has been reviewed Yes  Transition of care needs no transition of care needs at this time    PT evaluated pt, no follow up recommended. No TOC needs at this time.  Signed: Heather Saltness, MSW, LCSW Clinical Social Worker Inpatient Care Management 04/11/2024 10:13 AM

## 2024-04-11 NOTE — Progress Notes (Signed)
 Nutrition Note  RD consulted for nutrition education regarding diabetes.   Lab Results  Component Value Date   HGBA1C >15.5 (H) 04/08/2024    RD provided Carbohydrate Counting for People with Diabetes and Plate Method handout from the Academy of Nutrition and Dietetics. Discussed different food groups and their effects on blood sugar, emphasizing carbohydrate-containing foods. Provided list of carbohydrates and recommended serving sizes of common foods.  Discussed importance of controlled and consistent carbohydrate intake throughout the day. Provided examples of ways to balance meals/snacks and encouraged intake of high-fiber, whole grain complex carbohydrates. Teach back method used.  Expect good compliance. Pt reports not eating consistently and drinking a lot of sodas PTA. Pt plans to stop drinking sweetened beverages and to start eating breakfast.   Body mass index is 32.94 kg/m. Pt meets criteria for obesity based on current BMI.  Current diet order is CHO modified, patient is consuming approximately 25-100% of meals at this time. Labs and medications reviewed. No further nutrition interventions warranted at this time.  If additional nutrition issues arise, please re-consult RD.  Leslie Lee, MS, RD, LDN Inpatient Clinical Dietitian Contact via Secure chat

## 2024-04-11 NOTE — Plan of Care (Signed)

## 2024-04-11 NOTE — Inpatient Diabetes Management (Addendum)
 Inpatient Diabetes Program Recommendations  AACE/ADA: New Consensus Statement on Inpatient Glycemic Control (2015)  Target Ranges:  Prepandial:   less than 140 mg/dL      Peak postprandial:   less than 180 mg/dL (1-2 hours)      Critically ill patients:  140 - 180 mg/dL   Lab Results  Component Value Date   GLUCAP 102 (H) 04/11/2024   HGBA1C >15.5 (H) 04/08/2024    Review of Glycemic Control  Latest Reference Range & Units 04/10/24 07:56 04/10/24 11:15 04/10/24 16:39 04/10/24 21:43 04/11/24 07:48  Glucose-Capillary 70 - 99 mg/dL 595 (H) 627 (H) 538 (H) 304 (H) 102 (H)  (H): Data is abnormally high  Diabetes history: DM Outpatient Diabetes medications: None Current orders for Inpatient glycemic control: Lantus  40 units BID, Novolog  0-15 units TID and 0-5 units QHS  Met with patient at bedside.  Reviewed patient's current A1c of >15.5%. Explained what a A1c is and what it measures. Also reviewed goal A1c with patient, importance of good glucose control @ home, and blood sugar goals.   She has been told she has Pre-DM and DM2.  PCP prescribed Ozempic 3 weeks ago.  She didn't realize it was a DM medication-she thought it was a weight loss medication.  Explained this medication and how it works.  I do think this would good for her to take as with her insulin .    Educated on The Plate Method, CHO's, portion control, avoiding caloric beverages, CBGs at home fasting and mid afternoon, F/U with PCP every 3 months, bring meter to PCP office, long and short term complications of uncontrolled BG, and importance of exercise.  Ordered the LWWDM booklet, Insulin  starter kit and RD consult.  Attached education to her paperwork and a video on insulin  administration using the insulin  pen.    Educated patient on insulin  pen use at home. Reviewed contents of insulin  flexpen starter kit. Reviewed all steps of insulin  pen including attachment of needle, 2-unit air shot, dialing up dose, giving injection,  removing needle, disposal of sharps, storage of unused insulin , disposal of insulin  etc. Patient able to provide successful return demonstration. Also reviewed troubleshooting with insulin  pen. MD to give patient Rxs for insulin  pens and insulin  pen needles.  Explained long and rapid insulins and when to administer.  Reviewed hypoglycemia, < 70 mg/dL, signs, symptoms and treatments.   TOC pharmacy performed a benefit check-Lantus  and Novolog  are $35 each.  She is interested in a CGM.  Dexcom is preferred.  She is downloading the Dexcom G7 app on her IPhone.  Will ask MD for permission to place the CGM.    She has a PCP appointment on Saturday.  Asked her to call MD for glucose trends consistently > 200 mg/dL or <899 mg/dL.    Placed Dexcom G7 on the back of her left arm.  Educated her on use.    Thank you, Wyvonna Pinal, MSN, CDCES Diabetes Coordinator Inpatient Diabetes Program [phone] (team pager from 8a-5p)

## 2024-04-11 NOTE — Telephone Encounter (Signed)
 Pharmacy Patient Advocate Encounter  Insurance verification completed.    The patient is insured through [facility]. Patient has [employer], may use a copay card, and/or apply for patient assistance if available.    Ran test claim for Lantus  100units Pen and the current 30 day co-pay is $35.  Ran test claim for Humalog 100units Pen and the current 30 day co-pay is $35.  Ran test claim for Dexcom G7 sensor and it requires a PA.  This test claim was processed through  [facility]- copay amounts may vary at other pharmacies due to pharmacy/plan contracts, or as the patient moves through the different stages of their insurance plan.

## 2024-04-11 NOTE — Progress Notes (Signed)
 [facility] PROGRESS NOTE    Progress Note  [name]  FMW:[id number] DOB: [birth year] DOA: 04/08/2024 PCP: Jerome Heron Ruth, PA-C     Brief Narrative:   [name] is an 53 y.o. female past medical history of essential hypertension prediabetes obesity comes in with increased urination nausea was found to have a blood glucose of greater than 1200 and DKA.  Assessment/Plan:   Hyperosmolar hyperglycemic state: Currently on long-acting insulin  sliding scale for blood glucose this morning is improved. Will continue Lantus  and sliding scale insulin . Consult diabetes educator. A1c is pending. She will need to go home on insulin . Consult diabetes educator.  Acute kidney injury: Likely prerenal azotemia with a baseline creatinine of less than 1. On admission 1.7, her creatinine returned to baseline with IV fluids. KVO IV fluids.  Hypokalemia: Likely due to acidosis now resolved.  Pseudohyponatremia: Improved once her blood sugar is controlled this will continue to improve.  Essential hypertension: Blood pressure is relatively well-controlled. Continue to hold antihypertensive medication to allow renal perfusion.  Obesity, Class III, BMI 40-49.9 (morbid obesity) (HCC) Morbid obesity: Noted.    DVT prophylaxis: lovenox  Family Communication:none Status is: Inpatient Remains inpatient appropriate because: Hyperglycemic hyperosmolar nonketotic state.    Code Status:     Code Status Orders  (From admission, onward)           Start     Ordered   04/08/24 1702  Full code  Continuous       Question:  By:  Answer:  Consent: discussion documented in EHR   04/08/24 1706           Code Status History     Date Active Date Inactive Code Status Order ID Comments User Context   03/01/2023 2346 03/04/2023 2027 Full Code [id number]  Charlton Evalene RAMAN, MD ED   12/14/2018 0519 12/14/2018 1534 Full Code [id number]  Silvester Ales, MD ED   10/18/2013 0749  10/19/2013 1458 Full Code [id number]  Juvenal Harlene PENNER, DO Inpatient         IV Access:   Peripheral IV   Procedures and diagnostic studies:   No results found.   Medical Consultants:   None.   Subjective:    [name]  feels better no complaints  Objective:    Vitals:   04/10/24 0541 04/10/24 1334 04/10/24 2145 04/11/24 0538  BP: 129/87 138/80 (!) 144/88 (!) 140/92  Pulse: (!) 56 (!) 58 (!) 58 (!) 58  Resp: 18 16 18 18   Temp: 97.6 F (36.4 C) 97.6 F (36.4 C) 97.8 F (36.6 C) 97.7 F (36.5 C)  TempSrc: Oral Oral Oral Oral  SpO2: 100% 98% 100% 100%  Weight:      Height:       SpO2: 100 %   Intake/Output Summary (Last 24 hours) at 04/11/2024 0844 Last data filed at 04/11/2024 0600 Gross per 24 hour  Intake 1643.83 ml  Output 1350 ml  Net 293.83 ml   Filed Weights   04/08/24 1053 04/08/24 1609  Weight: 85.7 kg 81.7 kg    Exam: General exam: In no acute distress. Respiratory system: Good air movement and clear to auscultation. Cardiovascular system: S1 & S2 heard, RRR. No JVD. Gastrointestinal system: Abdomen is nondistended, soft and nontender.  Extremities: No pedal edema. Skin: No rashes, lesions or ulcers Psychiatry: Judgement and insight appear normal. Mood & affect appropriate.  Data Reviewed:    Labs: Basic Metabolic Panel: Recent Labs  Lab 04/08/24 2117 04/09/24  0144 04/09/24 0830 04/09/24 2147 04/10/24 0526 04/11/24 0416  NA 132* 133* 134*  --  132* 138  K 3.7 3.2* 3.8  --  3.5 3.9  CL 91* 94* 95*  --  96* 103  CO2 27 28 28   --  26 28  GLUCOSE 273* 220* 171* 391* 382* 94  BUN 49* 47* 45*  --  29* 18  CREATININE 1.70* 1.61* 1.63*  --  1.17* 0.86  CALCIUM 10.1 9.9 9.7  --  9.0 9.4   GFR Estimated Creatinine Clearance: 74 mL/min (by C-G formula based on SCr of 0.86 mg/dL). Liver Function Tests: Recent Labs  Lab 04/08/24 1057 04/09/24 0830 04/10/24 0526  AST 14* 57* 66*  ALT 48* 47* 61*  ALKPHOS 177* 101 101   BILITOT 1.1 0.9 0.7  PROT 8.4* 6.5 6.1*  ALBUMIN 4.8 3.7 3.5   No results for input(s): LIPASE, AMYLASE in the last 168 hours. No results for input(s): AMMONIA in the last 168 hours. Coagulation profile No results for input(s): INR, PROTIME in the last 168 hours. COVID-19 Labs  No results for input(s): DDIMER, FERRITIN, LDH, CRP in the last 72 hours.  Lab Results  Component Value Date   SARSCOV2NAA NEGATIVE 04/08/2024   SARSCOV2NAA NEGATIVE 03/01/2023   SARSCOV2NAA NEGATIVE 12/13/2018    CBC: Recent Labs  Lab 04/08/24 1057 04/08/24 1255 04/08/24 1304 04/08/24 1834  WBC 6.9  --   --  9.0  HGB 15.4* 17.3* 17.3* 15.1*  HCT 52.3* 51.0* 51.0* 43.2  MCV 105.0*  --   --  86.6  PLT 294  --   --  290   Cardiac Enzymes: No results for input(s): CKTOTAL, CKMB, CKMBINDEX, TROPONINI in the last 168 hours. BNP (last 3 results) No results for input(s): PROBNP in the last 8760 hours. CBG: Recent Labs  Lab 04/10/24 0756 04/10/24 1115 04/10/24 1639 04/10/24 2143 04/11/24 0748  GLUCAP 404* 372* 461* 304* 102*   D-Dimer: No results for input(s): DDIMER in the last 72 hours. Hgb A1c: No results for input(s): HGBA1C in the last 72 hours. Lipid Profile: No results for input(s): CHOL, HDL, LDLCALC, TRIG, CHOLHDL, LDLDIRECT in the last 72 hours. Thyroid  function studies: Recent Labs    04/09/24 0144  TSH 0.387   Anemia work up: No results for input(s): VITAMINB12, FOLATE, FERRITIN, TIBC, IRON, RETICCTPCT in the last 72 hours. Sepsis Labs: Recent Labs  Lab 04/08/24 1057 04/08/24 1834  WBC 6.9 9.0   Microbiology Recent Results (from the past 240 hours)  Resp panel by RT-PCR (RSV, Flu A&B, Covid) Anterior Nasal Swab     Status: None   Collection Time: 04/08/24 12:28 PM   Specimen: Anterior Nasal Swab  Result Value Ref Range Status   SARS Coronavirus 2 by RT PCR NEGATIVE NEGATIVE Final    Comment:  (NOTE) SARS-CoV-2 target nucleic acids are NOT DETECTED.  The SARS-CoV-2 RNA is generally detectable in upper respiratory specimens during the acute phase of infection. The lowest concentration of SARS-CoV-2 viral copies this assay can detect is 138 copies/mL. A negative result does not preclude SARS-Cov-2 infection and should not be used as the sole basis for treatment or other patient management decisions. A negative result may occur with  improper specimen collection/handling, submission of specimen other than nasopharyngeal swab, presence of viral mutation(s) within the areas targeted by this assay, and inadequate number of viral copies(<138 copies/mL). A negative result must be combined with clinical observations, patient history, and epidemiological information. The expected result  is Negative.  Fact Sheet for Patients:  bloggercourse.com  Fact Sheet for Healthcare Providers:  seriousbroker.it  This test is no t yet approved or cleared by the [place]  FDA and  has been authorized for detection and/or diagnosis of SARS-CoV-2 by FDA under an Emergency Use Authorization (EUA). This EUA will remain  in effect (meaning this test can be used) for the duration of the COVID-19 declaration under Section 564(b)(1) of the Act, 21 U.S.C.section 360bbb-3(b)(1), unless the authorization is terminated  or revoked sooner.       Influenza A by PCR NEGATIVE NEGATIVE Final   Influenza B by PCR NEGATIVE NEGATIVE Final    Comment: (NOTE) The Xpert Xpress SARS-CoV-2/FLU/RSV plus assay is intended as an aid in the diagnosis of influenza from Nasopharyngeal swab specimens and should not be used as a sole basis for treatment. Nasal washings and aspirates are unacceptable for Xpert Xpress SARS-CoV-2/FLU/RSV testing.  Fact Sheet for Patients: bloggercourse.com  Fact Sheet for Healthcare  Providers: seriousbroker.it  This test is not yet approved or cleared by the [place]  FDA and has been authorized for detection and/or diagnosis of SARS-CoV-2 by FDA under an Emergency Use Authorization (EUA). This EUA will remain in effect (meaning this test can be used) for the duration of the COVID-19 declaration under Section 564(b)(1) of the Act, 21 U.S.C. section 360bbb-3(b)(1), unless the authorization is terminated or revoked.     Resp Syncytial Virus by PCR NEGATIVE NEGATIVE Final    Comment: (NOTE) Fact Sheet for Patients: bloggercourse.com  Fact Sheet for Healthcare Providers: seriousbroker.it  This test is not yet approved or cleared by the [place]  FDA and has been authorized for detection and/or diagnosis of SARS-CoV-2 by FDA under an Emergency Use Authorization (EUA). This EUA will remain in effect (meaning this test can be used) for the duration of the COVID-19 declaration under Section 564(b)(1) of the Act, 21 U.S.C. section 360bbb-3(b)(1), unless the authorization is terminated or revoked.  Performed at [facility], [address] [place], [place] [address]   Group A Strep by PCR     Status: None   Collection Time: 04/08/24 12:33 PM  Result Value Ref Range Status   Group A Strep by PCR NOT DETECTED NOT DETECTED Final    Comment: Performed at [facility], [address], [place], [place] [address]  MRSA Next Gen by PCR, Nasal     Status: None   Collection Time: 04/08/24  4:20 PM   Specimen: Nasal Mucosa; Nasal Swab  Result Value Ref Range Status   MRSA by PCR Next Gen NOT DETECTED NOT DETECTED Final    Comment: (NOTE) The GeneXpert MRSA Assay (FDA approved for NASAL specimens only), is one component of a comprehensive MRSA colonization surveillance program. It is not intended to diagnose MRSA infection nor to guide or monitor  treatment for MRSA infections. Test performance is not FDA approved in patients less than 34 years old. Performed at [facility], [address] [place], [place] [address]      Medications:    Chlorhexidine  Gluconate Cloth  6 each Topical Daily   clotrimazole   10 mg Oral 5 X Daily   feeding supplement  237 mL Oral BID BM   heparin   5,000 Units Subcutaneous Q8H   insulin  aspart  0-15 Units Subcutaneous TID WC   insulin  aspart  0-5 Units Subcutaneous QHS   insulin  aspart  6 Units Subcutaneous TID WC   insulin  glargine  40 Units  Subcutaneous BID   Continuous Infusions:  sodium chloride  100 mL/hr at 04/11/24 0528      LOS: 3 days   Erle Odell Castor  [facility]  04/11/2024, 8:44 AM

## 2024-04-12 DIAGNOSIS — E1100 Type 2 diabetes mellitus with hyperosmolarity without nonketotic hyperglycemic-hyperosmolar coma (NKHHC): Secondary | ICD-10-CM | POA: Diagnosis not present

## 2024-04-12 DIAGNOSIS — N179 Acute kidney failure, unspecified: Secondary | ICD-10-CM | POA: Diagnosis not present

## 2024-04-12 DIAGNOSIS — B370 Candidal stomatitis: Secondary | ICD-10-CM | POA: Diagnosis not present

## 2024-04-12 LAB — GLUCOSE, CAPILLARY
Glucose-Capillary: 258 mg/dL — ABNORMAL HIGH (ref 70–99)
Glucose-Capillary: 230 mg/dL — ABNORMAL HIGH (ref 70–99)
Glucose-Capillary: 150 mg/dL — ABNORMAL HIGH (ref 70–99)
Glucose-Capillary: 92 mg/dL (ref 70–99)
Glucose-Capillary: 240 mg/dL — ABNORMAL HIGH (ref 70–99)

## 2024-04-12 LAB — HEMOGLOBIN A1C
Hgb A1c MFr Bld: >15.5 % — ABNORMAL HIGH (ref 4.8–5.6)
Hgb A1c MFr Bld: >15.5 % — ABNORMAL HIGH (ref 4.8–5.6)
Mean Plasma Glucose: >398 mg/dL
Mean Plasma Glucose: >398 mg/dL

## 2024-04-12 MED ORDER — LOSARTAN POTASSIUM 25 MG PO TABS: 25.0000 mg | ORAL_TABLET | Freq: Every day | ORAL | Status: DC

## 2024-04-12 MED ORDER — LOSARTAN POTASSIUM 25 MG PO TABS
25.0000 mg | ORAL_TABLET | Freq: Every day | ORAL | Status: DC
  Administered 2024-04-12: 09:00:00 25 mg via ORAL
  Filled 2024-04-12: qty 1

## 2024-04-12 MED ORDER — METFORMIN HCL 500 MG PO TABS
500.0000 mg | ORAL_TABLET | Freq: Two times a day (BID) | ORAL | Status: DC
  Administered 2024-04-12 – 2024-04-13 (×3): 500 mg via ORAL
  Filled 2024-04-12 (×3): qty 1

## 2024-04-12 MED ORDER — INSULIN GLARGINE 100 UNIT/ML ~~LOC~~ SOLN
10.0000 [IU] | Freq: Once | SUBCUTANEOUS | Status: AC
  Administered 2024-04-12: 11:00:00 10 [IU] via SUBCUTANEOUS
  Filled 2024-04-12: qty 0.1

## 2024-04-12 MED ORDER — INSULIN GLARGINE 100 UNIT/ML ~~LOC~~ SOLN
50.0000 [IU] | Freq: Two times a day (BID) | SUBCUTANEOUS | Status: DC
  Administered 2024-04-12 – 2024-04-13 (×2): 50 [IU] via SUBCUTANEOUS
  Filled 2024-04-12 (×3): qty 0.5

## 2024-04-12 MED ORDER — HYDROCHLOROTHIAZIDE 12.5 MG PO TABS
12.5000 mg | ORAL_TABLET | Freq: Every day | ORAL | Status: DC
  Administered 2024-04-12 – 2024-04-13 (×2): 12.5 mg via ORAL
  Filled 2024-04-12 (×2): qty 1

## 2024-04-12 MED ORDER — INSULIN GLARGINE-YFGN 100 UNIT/ML ~~LOC~~ SOPN: 10.0000 [IU] | PEN_INJECTOR | Freq: Once | SUBCUTANEOUS | Status: DC

## 2024-04-12 NOTE — Progress Notes (Signed)
 Patient called, complaints of dizziness, headache, and chest discomfort. BP was 182/111, EKG done showing NSR. Hydralazine  was administered per order. Dr. Odell Castor was paged and ordered to give to hydralazine , recheck back, and keep eye on her.

## 2024-04-12 NOTE — Progress Notes (Signed)
 [facility] PROGRESS NOTE    Progress Note  [name]  FMW:[id number] DOB: 14-Jun-1970 DOA: 04/08/2024 PCP: Jerome Heron Ruth, PA-C     Brief Narrative:   [name] is an 53 y.o. female past medical history of essential hypertension prediabetes obesity comes in with increased urination nausea was found to have a blood glucose of greater than 1200 and DKA.  Assessment/Plan:   Hyperosmolar hyperglycemic state/diabetes mellitus type 2: Currently on long-acting insulin  sliding scale, blood glucose significantly elevated this morning. Diabetes educator instructing her on how to give insulin  as she is kind of on the fence about doing it herself. A1c is greater than 15. Blood glucose still elevated increase long-acting insulin  add metformin  continue CBGs AC and at bedtime.  Acute kidney injury: Likely prerenal azotemia with a baseline creatinine of less than 1. On admission 1.7, her creatinine returned to baseline with IV fluids. KVO IV fluids.  Hypokalemia: Likely due to acidosis now resolved.  Pseudohyponatremia: Improved once her blood sugar is controlled this will continue to improve.  Essential hypertension: Blood pressure is relatively well-controlled. Continue to hold antihypertensive medication to allow renal perfusion.  Obesity, Class III, BMI 40-49.9 (morbid obesity) (HCC) Morbid obesity: Noted.    DVT prophylaxis: lovenox  Family Communication:none Status is: Inpatient Remains inpatient appropriate because: Hyperglycemic hyperosmolar nonketotic state.    Code Status:     Code Status Orders  (From admission, onward)           Start     Ordered   04/08/24 1702  Full code  Continuous       Question:  By:  Answer:  Consent: discussion documented in EHR   04/08/24 1706           Code Status History     Date Active Date Inactive Code Status Order ID Comments User Context   03/01/2023 2346 03/04/2023 2027 Full Code [id number]  Charlton Evalene RAMAN, MD ED   12/14/2018 0519 12/14/2018 1534 Full Code [id number]  Silvester Ales, MD ED   10/18/2013 0749 10/19/2013 1458 Full Code [id number]  Juvenal Harlene PENNER, DO Inpatient         IV Access:   Peripheral IV   Procedures and diagnostic studies:   No results found.   Medical Consultants:   None.   Subjective:    Anikah F Vandehei complaints today.  Objective:    Vitals:   04/11/24 0538 04/11/24 1353 04/11/24 2018 04/12/24 0649  BP: (!) 140/92 (!) 149/83 (!) 150/94 (!) 168/99  Pulse: (!) 58 67 67 64  Resp: 18 16 18 18   Temp: 97.7 F (36.5 C) 98.1 F (36.7 C) 98.1 F (36.7 C) 97.8 F (36.6 C)  TempSrc: Oral Oral Oral Oral  SpO2: 100% 100% 100% 100%  Weight:      Height:       SpO2: 100 %   Intake/Output Summary (Last 24 hours) at 04/12/2024 1025 Last data filed at 04/12/2024 1000 Gross per 24 hour  Intake 880 ml  Output --  Net 880 ml   Filed Weights   04/08/24 1053 04/08/24 1609  Weight: 85.7 kg 81.7 kg    Exam: General exam: In no acute distress. Respiratory system: Good air movement and clear to auscultation. Cardiovascular system: S1 & S2 heard, RRR. No JVD. Gastrointestinal system: Abdomen is nondistended, soft and nontender.  Extremities: No pedal edema. Skin: No rashes, lesions or ulcers Psychiatry: Judgement and insight appear normal. Mood & affect appropriate. Data Reviewed:  Labs: Basic Metabolic Panel: Recent Labs  Lab 04/08/24 2117 04/09/24 0144 04/09/24 0830 04/09/24 2147 04/10/24 0526 04/11/24 0416  NA 132* 133* 134*  --  132* 138  K 3.7 3.2* 3.8  --  3.5 3.9  CL 91* 94* 95*  --  96* 103  CO2 27 28 28   --  26 28  GLUCOSE 273* 220* 171* 391* 382* 94  BUN 49* 47* 45*  --  29* 18  CREATININE 1.70* 1.61* 1.63*  --  1.17* 0.86  CALCIUM 10.1 9.9 9.7  --  9.0 9.4   GFR Estimated Creatinine Clearance: 74 mL/min (by C-G formula based on SCr of 0.86 mg/dL). Liver Function Tests: Recent Labs  Lab 04/08/24 1057  04/09/24 0830 04/10/24 0526  AST 14* 57* 66*  ALT 48* 47* 61*  ALKPHOS 177* 101 101  BILITOT 1.1 0.9 0.7  PROT 8.4* 6.5 6.1*  ALBUMIN 4.8 3.7 3.5   No results for input(s): LIPASE, AMYLASE in the last 168 hours. No results for input(s): AMMONIA in the last 168 hours. Coagulation profile No results for input(s): INR, PROTIME in the last 168 hours. COVID-19 Labs  No results for input(s): DDIMER, FERRITIN, LDH, CRP in the last 72 hours.  Lab Results  Component Value Date   SARSCOV2NAA NEGATIVE 04/08/2024   SARSCOV2NAA NEGATIVE 03/01/2023   SARSCOV2NAA NEGATIVE 12/13/2018    CBC: Recent Labs  Lab 04/08/24 1057 04/08/24 1255 04/08/24 1304 04/08/24 1834  WBC 6.9  --   --  9.0  HGB 15.4* 17.3* 17.3* 15.1*  HCT 52.3* 51.0* 51.0* 43.2  MCV 105.0*  --   --  86.6  PLT 294  --   --  290   Cardiac Enzymes: No results for input(s): CKTOTAL, CKMB, CKMBINDEX, TROPONINI in the last 168 hours. BNP (last 3 results) No results for input(s): PROBNP in the last 8760 hours. CBG: Recent Labs  Lab 04/11/24 1126 04/11/24 1236 04/11/24 1639 04/11/24 2016 04/12/24 0743  GLUCAP 224* 222* 234* 221* 258*   D-Dimer: No results for input(s): DDIMER in the last 72 hours. Hgb A1c: No results for input(s): HGBA1C in the last 72 hours. Lipid Profile: No results for input(s): CHOL, HDL, LDLCALC, TRIG, CHOLHDL, LDLDIRECT in the last 72 hours. Thyroid  function studies: No results for input(s): TSH, T4TOTAL, T3FREE, THYROIDAB in the last 72 hours.  Invalid input(s): FREET3  Anemia work up: No results for input(s): VITAMINB12, FOLATE, FERRITIN, TIBC, IRON, RETICCTPCT in the last 72 hours. Sepsis Labs: Recent Labs  Lab 04/08/24 1057 04/08/24 1834  WBC 6.9 9.0   Microbiology Recent Results (from the past 240 hours)  Resp panel by RT-PCR (RSV, Flu A&B, Covid) Anterior Nasal Swab     Status: None   Collection Time:  04/08/24 12:28 PM   Specimen: Anterior Nasal Swab  Result Value Ref Range Status   SARS Coronavirus 2 by RT PCR NEGATIVE NEGATIVE Final    Comment: (NOTE) SARS-CoV-2 target nucleic acids are NOT DETECTED.  The SARS-CoV-2 RNA is generally detectable in upper respiratory specimens during the acute phase of infection. The lowest concentration of SARS-CoV-2 viral copies this assay can detect is 138 copies/mL. A negative result does not preclude SARS-Cov-2 infection and should not be used as the sole basis for treatment or other patient management decisions. A negative result may occur with  improper specimen collection/handling, submission of specimen other than nasopharyngeal swab, presence of viral mutation(s) within the areas targeted by this assay, and inadequate number of viral copies(<138 copies/mL).  A negative result must be combined with clinical observations, patient history, and epidemiological information. The expected result is Negative.  Fact Sheet for Patients:  bloggercourse.com  Fact Sheet for Healthcare Providers:  seriousbroker.it  This test is no t yet approved or cleared by the [place]  FDA and  has been authorized for detection and/or diagnosis of SARS-CoV-2 by FDA under an Emergency Use Authorization (EUA). This EUA will remain  in effect (meaning this test can be used) for the duration of the COVID-19 declaration under Section 564(b)(1) of the Act, 21 U.S.C.section 360bbb-3(b)(1), unless the authorization is terminated  or revoked sooner.       Influenza A by PCR NEGATIVE NEGATIVE Final   Influenza B by PCR NEGATIVE NEGATIVE Final    Comment: (NOTE) The Xpert Xpress SARS-CoV-2/FLU/RSV plus assay is intended as an aid in the diagnosis of influenza from Nasopharyngeal swab specimens and should not be used as a sole basis for treatment. Nasal washings and aspirates are unacceptable for Xpert Xpress  SARS-CoV-2/FLU/RSV testing.  Fact Sheet for Patients: bloggercourse.com  Fact Sheet for Healthcare Providers: seriousbroker.it  This test is not yet approved or cleared by the [place]  FDA and has been authorized for detection and/or diagnosis of SARS-CoV-2 by FDA under an Emergency Use Authorization (EUA). This EUA will remain in effect (meaning this test can be used) for the duration of the COVID-19 declaration under Section 564(b)(1) of the Act, 21 U.S.C. section 360bbb-3(b)(1), unless the authorization is terminated or revoked.     Resp Syncytial Virus by PCR NEGATIVE NEGATIVE Final    Comment: (NOTE) Fact Sheet for Patients: bloggercourse.com  Fact Sheet for Healthcare Providers: seriousbroker.it  This test is not yet approved or cleared by the [place]  FDA and has been authorized for detection and/or diagnosis of SARS-CoV-2 by FDA under an Emergency Use Authorization (EUA). This EUA will remain in effect (meaning this test can be used) for the duration of the COVID-19 declaration under Section 564(b)(1) of the Act, 21 U.S.C. section 360bbb-3(b)(1), unless the authorization is terminated or revoked.  Performed at [facility], [address], [place], [place] [address]   Group A Strep by PCR     Status: None   Collection Time: 04/08/24 12:33 PM  Result Value Ref Range Status   Group A Strep by PCR NOT DETECTED NOT DETECTED Final    Comment: Performed at [facility], [address], [place], [place] [address]  MRSA Next Gen by PCR, Nasal     Status: None   Collection Time: 04/08/24  4:20 PM   Specimen: Nasal Mucosa; Nasal Swab  Result Value Ref Range Status   MRSA by PCR Next Gen NOT DETECTED NOT DETECTED Final    Comment: (NOTE) The GeneXpert MRSA Assay (FDA approved for NASAL specimens only), is one component of a  comprehensive MRSA colonization surveillance program. It is not intended to diagnose MRSA infection nor to guide or monitor treatment for MRSA infections. Test performance is not FDA approved in patients less than 34 years old. Performed at [facility], [address], [place], [place] [address]      Medications:    Chlorhexidine  Gluconate Cloth  6 each Topical Daily   clotrimazole   10 mg Oral 5 X Daily   heparin   5,000 Units Subcutaneous Q8H   hydrochlorothiazide   12.5 mg Oral Daily   insulin  aspart  0-15 Units Subcutaneous TID WC   insulin  aspart  0-5 Units Subcutaneous QHS  insulin  aspart  6 Units Subcutaneous TID WC   insulin  glargine  10 Units Subcutaneous Once   insulin  glargine  50 Units Subcutaneous BID   insulin  starter kit- pen needles  1 kit Other Once   living well with diabetes book   Does not apply Once   losartan   25 mg Oral Daily   Continuous Infusions:      LOS: 4 days   Erle Odell Castor  [facility]  04/12/2024, 10:25 AM

## 2024-04-12 NOTE — Plan of Care (Signed)
   Problem: Clinical Measurements: Goal: Will remain free from infection Outcome: Progressing Goal: Diagnostic test results will improve Outcome: Progressing

## 2024-04-12 NOTE — Progress Notes (Signed)
 Physical Therapy Treatment Patient Details Name: [name] MRN: [id number] DOB: 1971/01/30 Today's Date: 04/12/2024   History of Present Illness Pt admitted from home with c/o weakness/fatigue, dizziness and inability to ambulate.  Pt new dx DM with DKA, metabolic acidosis, acute renal failure, hypokalemia and hyponatremia.  Pt with hx of htn.    PT Comments   Pt admitted with above diagnosis.  Pt currently with functional limitations due to the deficits listed below (see PT Problem List). Pt seated in recliner when PT arrived. Pt agreeable to therapy intervention. Pt reported she would like to amb no AD in hallway and that she has been able to amb in personal room bed <> recliner <> bathroom. Pt reports she is a little concerned that she may be unstable if her blood glucose levels elevated again in home setting and is interested in having a SPC in home setting. PT recommends at this time O[facility] and pt verbalized understanding. Pt is mod I for sit to stand, CGA and progressed to close S for gait tasks in hallway with 180 feet without AD noted lateral sway and slightly decreased stride length with no overt LOB, step navigation with R handrail and step to pattern with CGA and min cues. Pt left seated in recliner and all needs in place. Pt will benefit from acute skilled PT to increase their independence and safety with mobility to allow discharge.       If plan is discharge home, recommend the following:     Can travel by private vehicle        Equipment Recommendations  None recommended by PT    Recommendations for Other Services       Precautions / Restrictions Precautions Precautions: Fall Restrictions Weight Bearing Restrictions Per Provider Order: No     Mobility  Bed Mobility               General bed mobility comments: Pt up in chair and returns to same    Transfers Overall transfer level: Modified independent Equipment used: None Transfers: Sit to/from Stand              General transfer comment: no cues, pt has been self transfering bed<> recliner and recliner <> bathroom no AD    Ambulation/Gait Ambulation/Gait assistance: Supervision Gait Distance (Feet): 180 Feet Assistive device: None Gait Pattern/deviations: Step-through pattern, Decreased step length - right, Decreased step length - left Gait velocity: decr     General Gait Details: min cues no AD and lateral sway noted, pt is able to amb short distances in personal room no AD no overt LOB   Stairs Stairs: Yes Stairs assistance: Contact guard assist Stair Management: One rail Right Number of Stairs: 10 General stair comments: min cues, step to pattern with R UE when ascending  steps and B UE support on R handrail when descending steps   Wheelchair Mobility     Tilt Bed    Modified Rankin (Stroke Patients Only)       Balance Overall balance assessment: Needs assistance Sitting-balance support: Feet supported Sitting balance-Leahy Scale: Good     Standing balance support: No upper extremity supported Standing balance-Leahy Scale: Good                              Communication Communication Communication: No apparent difficulties  Cognition Arousal: Alert Behavior During Therapy: WFL for tasks assessed/performed, Flat affect   PT - Cognitive  impairments: No apparent impairments                         Following commands: Intact      Cueing Cueing Techniques: Verbal cues  Exercises      General Comments        Pertinent Vitals/Pain      Home Living Family/patient expects to be discharged to:: Private residence                        Prior Function            PT Goals (current goals can now be found in the care plan section) Acute Rehab PT Goals Patient Stated Goal: Regain IND PT Goal Formulation: With patient Time For Goal Achievement: 04/24/24 Potential to Achieve Goals: Good Progress towards PT goals:  Progressing toward goals    Frequency    Min 2X/week      PT Plan      Co-evaluation              AM-PAC PT 6 Clicks Mobility   Outcome Measure  Help needed turning from your back to your side while in a flat bed without using bedrails?: None Help needed moving from lying on your back to sitting on the side of a flat bed without using bedrails?: None Help needed moving to and from a bed to a chair (including a wheelchair)?: None Help needed standing up from a chair using your arms (e.g., wheelchair or bedside chair)?: None Help needed to walk in hospital room?: None Help needed climbing 3-5 steps with a railing? : A Little 6 Click Score: 23    End of Session Equipment Utilized During Treatment: Gait belt Activity Tolerance: Patient tolerated treatment well Patient left: in chair;with call bell/phone within reach Nurse Communication: Mobility status PT Visit Diagnosis: Unsteadiness on feet (R26.81);Muscle weakness (generalized) (M62.81);Difficulty in walking, not elsewhere classified (R26.2)     Time: 8950-8897 PT Time Calculation (min) (ACUTE ONLY): 13 min  Charges:    $Gait Training: 8-22 mins PT General Charges $$ ACUTE PT VISIT: 1 Visit                     [place], PT Acute Rehab    [place] VEAR Drone 04/12/2024, 1:20 PM

## 2024-04-13 ENCOUNTER — Other Ambulatory Visit (HOSPITAL_COMMUNITY): Payer: Self-pay

## 2024-04-13 DIAGNOSIS — B370 Candidal stomatitis: Secondary | ICD-10-CM | POA: Diagnosis not present

## 2024-04-13 DIAGNOSIS — E1100 Type 2 diabetes mellitus with hyperosmolarity without nonketotic hyperglycemic-hyperosmolar coma (NKHHC): Secondary | ICD-10-CM | POA: Diagnosis not present

## 2024-04-13 DIAGNOSIS — N179 Acute kidney failure, unspecified: Secondary | ICD-10-CM | POA: Diagnosis not present

## 2024-04-13 LAB — GLUCOSE, CAPILLARY
Glucose-Capillary: 139 mg/dL — ABNORMAL HIGH (ref 70–99)
Glucose-Capillary: 157 mg/dL — ABNORMAL HIGH (ref 70–99)

## 2024-04-13 MED ORDER — INSULIN PEN NEEDLE 31G X 6 MM MISC: 1.0000 | Freq: Two times a day (BID) | 3 refills | Status: AC

## 2024-04-13 MED ORDER — INSULIN ASPART 100 UNIT/ML FLEXPEN
6.0000 [IU] | PEN_INJECTOR | Freq: Three times a day (TID) | SUBCUTANEOUS | 11 refills | Status: AC
  Filled 2024-04-13: qty 6, 30d supply, fill #0

## 2024-04-13 MED ORDER — METFORMIN HCL 500 MG PO TABS
500.0000 mg | ORAL_TABLET | Freq: Two times a day (BID) | ORAL | 2 refills | Status: AC
  Filled 2024-04-13: qty 60, 30d supply, fill #0

## 2024-04-13 MED ORDER — LANCETS MISC
1.0000 | 0 refills | Status: AC
  Filled 2024-04-13: qty 100, 25d supply, fill #0

## 2024-04-13 MED ORDER — LOSARTAN POTASSIUM 100 MG PO TABS
100.0000 mg | ORAL_TABLET | Freq: Every day | ORAL | 1 refills | Status: AC
  Filled 2024-04-13: qty 30, 30d supply, fill #0

## 2024-04-13 MED ORDER — LOSARTAN POTASSIUM 50 MG PO TABS
50.0000 mg | ORAL_TABLET | Freq: Every day | ORAL | Status: DC
  Administered 2024-04-13: 10:00:00 50 mg via ORAL
  Filled 2024-04-13: qty 1

## 2024-04-13 MED ORDER — LANCET DEVICE MISC
1.0000 | 0 refills | Status: AC
  Filled 2024-04-13: qty 1, fill #0

## 2024-04-13 MED ORDER — BLOOD GLUCOSE TEST VI STRP
1.0000 | ORAL_STRIP | 0 refills | Status: AC
  Filled 2024-04-13: qty 100, 25d supply, fill #0

## 2024-04-13 MED ORDER — INSULIN GLARGINE 100 UNIT/ML SOLOSTAR PEN
100.0000 [IU] | PEN_INJECTOR | Freq: Every day | SUBCUTANEOUS | 11 refills | Status: AC
  Filled 2024-04-13: qty 15, 15d supply, fill #0

## 2024-04-13 MED ORDER — BLOOD GLUCOSE MONITOR SYSTEM W/DEVICE KIT
1.0000 | PACK | 0 refills | Status: AC
  Filled 2024-04-13: qty 1, 30d supply, fill #0

## 2024-04-13 NOTE — Progress Notes (Signed)
 Assessment unchanged. Pt verbalized understanding of dc instructions given regarding diabetic medications and follow up care. Answered all questions. Received all prescriptions from [facility]. Discharged via wc to front entrance accompanied by NT.

## 2024-04-13 NOTE — Discharge Summary (Signed)
 Physician Discharge Summary  [name] FMW:[id number] DOB: August 08, 1970 DOA: 04/08/2024  PCP: Jerome Heron Ruth, PA-C  Admit date: 04/08/2024 Discharge date: 04/13/2024  Admitted From: Home Disposition:  Home  Recommendations for Outpatient Follow-up:  Follow up with PCP in 1-2 weeks Please obtain BMP/CBC in one week   Home Health:No Equipment/Devices:None  Discharge Condition:Stable CODE STATUS:Full Diet recommendation: Heart Healthy  Brief/Interim Summary:  53 y.o. female past medical history of essential hypertension prediabetes obesity comes in with increased urination nausea was found to have a blood glucose of greater than 1200 and DKA.   Discharge Diagnoses:  Principal Problem:   Hyperosmolar hyperglycemic state (HHS) (HCC) Active Problems:   Essential hypertension   Essential hypertension   AKI (acute kidney injury)   Hyperkalemia   Pseudohyponatremia   Obesity, Class III, BMI 40-49.9 (morbid obesity) (HCC)  Hyperosmolar hyperglycemic nonketotic state/diabetes mellitus type 2: She was started on IV insulin  and IV fluids her blood glucose corrected. She will go home on long-acting insulin  twice a day plus NovoLog  8 units and metformin . Her A1c was greater than 15. Diabetes educator was consulted and educated her on insulin  and the use.  Acute kidney injury: PrerenalAzotemia resolved with IV fluids.  Hypokalemia: Repleted now improved.  Pseudohyponatremia: Resolved with IV fluids.  Central hypertension: No changes made to her medication.  Morbid obesity: Noted.  Discharge Instructions  Discharge Instructions     Increase activity slowly   Complete by: As directed       Allergies as of 04/13/2024       Reactions   Atorvastatin Other (See Comments)   Leg pain (both)        Medication List     STOP taking these medications    hydrALAZINE  25 MG tablet Commonly known as: APRESOLINE    penicillin  v potassium 500 MG tablet Commonly  known as: VEETID   potassium chloride  SA 20 MEQ tablet Commonly known as: KLOR-CON  M   predniSONE  5 MG tablet Commonly known as: DELTASONE        TAKE these medications    albuterol  108 (90 Base) MCG/ACT inhaler Commonly known as: VENTOLIN  HFA Inhale 2 puffs into the lungs every 6 (six) hours as needed for wheezing or shortness of breath.   amLODipine  10 MG tablet Commonly known as: NORVASC  Take 1 tablet (10 mg total) by mouth daily.   Blood Glucose Monitoring Suppl Devi 1 each by Does not apply route as directed. Dispense based on patient and insurance preference. Use up to four times daily as directed. (FOR ICD-10 E10.9, E11.9).   BLOOD GLUCOSE TEST STRIPS Strp 1 each by Does not apply route as directed. Dispense based on patient and insurance preference. Use up to four times daily as directed. (FOR ICD-10 E10.9, E11.9).   Blood Pressure Kit 1 each by Does not apply route daily after breakfast.   fluconazole 150 MG tablet Commonly known as: DIFLUCAN Take 150 mg by mouth every Monday.   insulin  aspart 100 UNIT/ML FlexPen Commonly known as: NOVOLOG  Inject 6 Units into the skin 3 (three) times daily with meals.   insulin  glargine 100 UNIT/ML Solostar Pen Commonly known as: LANTUS  Inject 100 Units into the skin daily.   Insulin  Pen Needle 31G X 6 MM Misc 1 Device by Does not apply route 2 (two) times daily.   Lancet Device Misc 1 each by Does not apply route as directed. Dispense based on patient and insurance preference. Use up to four times daily as directed. (FOR ICD-10  E10.9, E11.9).   Lancets Misc 1 each by Does not apply route as directed. Dispense based on patient and insurance preference. Use up to four times daily as directed. (FOR ICD-10 E10.9, E11.9).   losartan  100 MG tablet Commonly known as: COZAAR  Take 1 tablet (100 mg total) by mouth daily.   metFORMIN  500 MG tablet Commonly known as: GLUCOPHAGE  Take 1 tablet (500 mg total) by mouth 2 (two) times  daily with a meal.   Mucinex Fast-Max Severe Con/Cg 5-100 MG/5ML Liqd Generic drug: Dextromethorphan-guaiFENesin Take 20 mLs by mouth every 6 (six) hours as needed (for cold-like symptoms).   tetrahydrozoline-zinc 0.05-0.25 % ophthalmic solution Commonly known as: VISINE-AC Place 2 drops into both eyes 3 (three) times daily as needed (for redness).   triamterene -hydrochlorothiazide  75-50 MG tablet Commonly known as: MAXZIDE  Take 1 tablet by mouth daily. What changed: Another medication with the same name was removed. Continue taking this medication, and follow the directions you see here.        Allergies[1]  Consultations: None   Procedures/Studies: DG Chest Portable 1 View Result Date: 04/08/2024 CLINICAL DATA:  Body aches and sore throat for several days. EXAM: PORTABLE CHEST 1 VIEW COMPARISON:  03/02/2023 FINDINGS: The heart size and mediastinal contours are within normal limits. Both lungs are clear. The visualized skeletal structures are unremarkable. IMPRESSION: No active disease. Electronically Signed   By: Norleen DELENA Kil M.D.   On: 04/08/2024 12:43   Subjective: No complaints.  Discharge Exam: Vitals:   04/12/24 2116 04/13/24 0542  BP: (!) 151/81 (!) 157/98  Pulse: 73 68  Resp: 18 18  Temp: 98.7 F (37.1 C) 98.4 F (36.9 C)  SpO2: 94% 100%   Vitals:   04/12/24 1802 04/12/24 1843 04/12/24 2116 04/13/24 0542  BP: (!) 161/97 (!) 144/74 (!) 151/81 (!) 157/98  Pulse: 71 77 73 68  Resp:   18 18  Temp:   98.7 F (37.1 C) 98.4 F (36.9 C)  TempSrc:   Oral Oral  SpO2:  98% 94% 100%  Weight:      Height:        General: Pt is alert, awake, not in acute distress Cardiovascular: RRR, S1/S2 +, no rubs, no gallops Respiratory: CTA bilaterally, no wheezing, no rhonchi Abdominal: Soft, NT, ND, bowel sounds + Extremities: no edema, no cyanosis    The results of significant diagnostics from this hospitalization (including imaging, microbiology, ancillary and  laboratory) are listed below for reference.     Microbiology: Recent Results (from the past 240 hours)  Resp panel by RT-PCR (RSV, Flu A&B, Covid) Anterior Nasal Swab     Status: None   Collection Time: 04/08/24 12:28 PM   Specimen: Anterior Nasal Swab  Result Value Ref Range Status   SARS Coronavirus 2 by RT PCR NEGATIVE NEGATIVE Final    Comment: (NOTE) SARS-CoV-2 target nucleic acids are NOT DETECTED.  The SARS-CoV-2 RNA is generally detectable in upper respiratory specimens during the acute phase of infection. The lowest concentration of SARS-CoV-2 viral copies this assay can detect is 138 copies/mL. A negative result does not preclude SARS-Cov-2 infection and should not be used as the sole basis for treatment or other patient management decisions. A negative result may occur with  improper specimen collection/handling, submission of specimen other than nasopharyngeal swab, presence of viral mutation(s) within the areas targeted by this assay, and inadequate number of viral copies(<138 copies/mL). A negative result must be combined with clinical observations, patient history, and epidemiological information. The  expected result is Negative.  Fact Sheet for Patients:  bloggercourse.com  Fact Sheet for Healthcare Providers:  seriousbroker.it  This test is no t yet approved or cleared by the [place]  FDA and  has been authorized for detection and/or diagnosis of SARS-CoV-2 by FDA under an Emergency Use Authorization (EUA). This EUA will remain  in effect (meaning this test can be used) for the duration of the COVID-19 declaration under Section 564(b)(1) of the Act, 21 U.S.C.section 360bbb-3(b)(1), unless the authorization is terminated  or revoked sooner.       Influenza A by PCR NEGATIVE NEGATIVE Final   Influenza B by PCR NEGATIVE NEGATIVE Final    Comment: (NOTE) The Xpert Xpress SARS-CoV-2/FLU/RSV plus assay is  intended as an aid in the diagnosis of influenza from Nasopharyngeal swab specimens and should not be used as a sole basis for treatment. Nasal washings and aspirates are unacceptable for Xpert Xpress SARS-CoV-2/FLU/RSV testing.  Fact Sheet for Patients: bloggercourse.com  Fact Sheet for Healthcare Providers: seriousbroker.it  This test is not yet approved or cleared by the [place]  FDA and has been authorized for detection and/or diagnosis of SARS-CoV-2 by FDA under an Emergency Use Authorization (EUA). This EUA will remain in effect (meaning this test can be used) for the duration of the COVID-19 declaration under Section 564(b)(1) of the Act, 21 U.S.C. section 360bbb-3(b)(1), unless the authorization is terminated or revoked.     Resp Syncytial Virus by PCR NEGATIVE NEGATIVE Final    Comment: (NOTE) Fact Sheet for Patients: bloggercourse.com  Fact Sheet for Healthcare Providers: seriousbroker.it  This test is not yet approved or cleared by the [place]  FDA and has been authorized for detection and/or diagnosis of SARS-CoV-2 by FDA under an Emergency Use Authorization (EUA). This EUA will remain in effect (meaning this test can be used) for the duration of the COVID-19 declaration under Section 564(b)(1) of the Act, 21 U.S.C. section 360bbb-3(b)(1), unless the authorization is terminated or revoked.  Performed at [facility], [address] [place], [place] [address]   Group A Strep by PCR     Status: None   Collection Time: 04/08/24 12:33 PM  Result Value Ref Range Status   Group A Strep by PCR NOT DETECTED NOT DETECTED Final    Comment: Performed at [facility], [address] [place], [place] [address]  MRSA Next Gen by PCR, Nasal     Status: None   Collection Time: 04/08/24  4:20 PM   Specimen: Nasal Mucosa; Nasal  Swab  Result Value Ref Range Status   MRSA by PCR Next Gen NOT DETECTED NOT DETECTED Final    Comment: (NOTE) The GeneXpert MRSA Assay (FDA approved for NASAL specimens only), is one component of a comprehensive MRSA colonization surveillance program. It is not intended to diagnose MRSA infection nor to guide or monitor treatment for MRSA infections. Test performance is not FDA approved in patients less than 61 years old. Performed at [facility], [address], [place], [place] [address]      Labs: BNP (last 3 results) No results for input(s): BNP in the last 8760 hours. Basic Metabolic Panel: Recent Labs  Lab 04/08/24 2117 04/09/24 0144 04/09/24 0830 04/09/24 2147 04/10/24 0526 04/11/24 0416  NA 132* 133* 134*  --  132* 138  K 3.7 3.2* 3.8  --  3.5 3.9  CL 91* 94* 95*  --  96* 103  CO2 27 28 28   --  26 28  GLUCOSE 273* 220* 171* 391* 382* 94  BUN 49* 47* 45*  --  29* 18  CREATININE 1.70* 1.61* 1.63*  --  1.17* 0.86  CALCIUM 10.1 9.9 9.7  --  9.0 9.4   Liver Function Tests: Recent Labs  Lab 04/08/24 1057 04/09/24 0830 04/10/24 0526  AST 14* 57* 66*  ALT 48* 47* 61*  ALKPHOS 177* 101 101  BILITOT 1.1 0.9 0.7  PROT 8.4* 6.5 6.1*  ALBUMIN 4.8 3.7 3.5   No results for input(s): LIPASE, AMYLASE in the last 168 hours. No results for input(s): AMMONIA in the last 168 hours. CBC: Recent Labs  Lab 04/08/24 1057 04/08/24 1255 04/08/24 1304 04/08/24 1834  WBC 6.9  --   --  9.0  HGB 15.4* 17.3* 17.3* 15.1*  HCT 52.3* 51.0* 51.0* 43.2  MCV 105.0*  --   --  86.6  PLT 294  --   --  290   Cardiac Enzymes: No results for input(s): CKTOTAL, CKMB, CKMBINDEX, TROPONINI in the last 168 hours. BNP: Invalid input(s): POCBNP CBG: Recent Labs  Lab 04/12/24 1122 04/12/24 1449 04/12/24 1630 04/12/24 2100 04/13/24 0724  GLUCAP 230* 150* 92 240* 139*   D-Dimer No results for input(s): DDIMER in the last 72 hours. Hgb  A1c Recent Labs    04/10/24 1311 04/11/24 0640  HGBA1C >15.5* >15.5*   Lipid Profile No results for input(s): CHOL, HDL, LDLCALC, TRIG, CHOLHDL, LDLDIRECT in the last 72 hours. Thyroid  function studies No results for input(s): TSH, T4TOTAL, T3FREE, THYROIDAB in the last 72 hours.  Invalid input(s): FREET3 Anemia work up No results for input(s): VITAMINB12, FOLATE, FERRITIN, TIBC, IRON, RETICCTPCT in the last 72 hours. Urinalysis    Component Value Date/Time   COLORURINE COLORLESS (A) 04/08/2024 1057   APPEARANCEUR CLEAR 04/08/2024 1057   LABSPEC 1.023 04/08/2024 1057   PHURINE 5.5 04/08/2024 1057   GLUCOSEU >1,000 (A) 04/08/2024 1057   HGBUR NEGATIVE 04/08/2024 1057   BILIRUBINUR NEGATIVE 04/08/2024 1057   BILIRUBINUR negative 07/10/2016 1213   BILIRUBINUR neg 07/29/2012 0937   KETONESUR TRACE (A) 04/08/2024 1057   PROTEINUR NEGATIVE 04/08/2024 1057   UROBILINOGEN 0.2 07/10/2016 1213   UROBILINOGEN 0.2 07/09/2010 1842   NITRITE NEGATIVE 04/08/2024 1057   LEUKOCYTESUR NEGATIVE 04/08/2024 1057   Sepsis Labs Recent Labs  Lab 04/08/24 1057 04/08/24 1834  WBC 6.9 9.0   Microbiology Recent Results (from the past 240 hours)  Resp panel by RT-PCR (RSV, Flu A&B, Covid) Anterior Nasal Swab     Status: None   Collection Time: 04/08/24 12:28 PM   Specimen: Anterior Nasal Swab  Result Value Ref Range Status   SARS Coronavirus 2 by RT PCR NEGATIVE NEGATIVE Final    Comment: (NOTE) SARS-CoV-2 target nucleic acids are NOT DETECTED.  The SARS-CoV-2 RNA is generally detectable in upper respiratory specimens during the acute phase of infection. The lowest concentration of SARS-CoV-2 viral copies this assay can detect is 138 copies/mL. A negative result does not preclude SARS-Cov-2 infection and should not be used as the sole basis for treatment or other patient management decisions. A negative result may occur with  improper specimen  collection/handling, submission of specimen other than nasopharyngeal swab, presence of viral mutation(s) within the areas targeted by this assay, and inadequate number of viral copies(<138 copies/mL). A negative result must be combined with clinical observations, patient history, and epidemiological information. The expected result is Negative.  Fact Sheet for Patients:  bloggercourse.com  Fact Sheet for Healthcare Providers:  seriousbroker.it  This test is no t yet approved or cleared by the [place]  FDA and  has been authorized for detection and/or diagnosis of SARS-CoV-2 by FDA under an Emergency Use Authorization (EUA). This EUA will remain  in effect (meaning this test can be used) for the duration of the COVID-19 declaration under Section 564(b)(1) of the Act, 21 U.S.C.section 360bbb-3(b)(1), unless the authorization is terminated  or revoked sooner.       Influenza A by PCR NEGATIVE NEGATIVE Final   Influenza B by PCR NEGATIVE NEGATIVE Final    Comment: (NOTE) The Xpert Xpress SARS-CoV-2/FLU/RSV plus assay is intended as an aid in the diagnosis of influenza from Nasopharyngeal swab specimens and should not be used as a sole basis for treatment. Nasal washings and aspirates are unacceptable for Xpert Xpress SARS-CoV-2/FLU/RSV testing.  Fact Sheet for Patients: bloggercourse.com  Fact Sheet for Healthcare Providers: seriousbroker.it  This test is not yet approved or cleared by the [place]  FDA and has been authorized for detection and/or diagnosis of SARS-CoV-2 by FDA under an Emergency Use Authorization (EUA). This EUA will remain in effect (meaning this test can be used) for the duration of the COVID-19 declaration under Section 564(b)(1) of the Act, 21 U.S.C. section 360bbb-3(b)(1), unless the authorization is terminated or revoked.     Resp Syncytial  Virus by PCR NEGATIVE NEGATIVE Final    Comment: (NOTE) Fact Sheet for Patients: bloggercourse.com  Fact Sheet for Healthcare Providers: seriousbroker.it  This test is not yet approved or cleared by the [place]  FDA and has been authorized for detection and/or diagnosis of SARS-CoV-2 by FDA under an Emergency Use Authorization (EUA). This EUA will remain in effect (meaning this test can be used) for the duration of the COVID-19 declaration under Section 564(b)(1) of the Act, 21 U.S.C. section 360bbb-3(b)(1), unless the authorization is terminated or revoked.  Performed at [facility], [address], [place], [place] [address]   Group A Strep by PCR     Status: None   Collection Time: 04/08/24 12:33 PM  Result Value Ref Range Status   Group A Strep by PCR NOT DETECTED NOT DETECTED Final    Comment: Performed at [facility], [address] [place], [place] [address]  MRSA Next Gen by PCR, Nasal     Status: None   Collection Time: 04/08/24  4:20 PM   Specimen: Nasal Mucosa; Nasal Swab  Result Value Ref Range Status   MRSA by PCR Next Gen NOT DETECTED NOT DETECTED Final    Comment: (NOTE) The GeneXpert MRSA Assay (FDA approved for NASAL specimens only), is one component of a comprehensive MRSA colonization surveillance program. It is not intended to diagnose MRSA infection nor to guide or monitor treatment for MRSA infections. Test performance is not FDA approved in patients less than 55 years old. Performed at [facility], [address], [place], [place] [address]      Time coordinating discharge: Over 35 minutes  SIGNED:   Erle Odell Castor, MD  [facility] 04/13/2024, 10:03 AM Pager   If 7PM-7AM, please contact night-coverage [email or web address] Password [email or web address]     [1]  Allergies Allergen Reactions   Atorvastatin Other (See Comments)     Leg pain (both)

## 2024-04-13 NOTE — Plan of Care (Signed)
   Problem: Clinical Measurements: Goal: Will remain free from infection Outcome: Progressing Goal: Diagnostic test results will improve Outcome: Progressing

## 2024-04-13 NOTE — Plan of Care (Signed)
 Problem: Education: Goal: Knowledge of General Education information will improve Description: Including pain rating scale, medication(s)/side effects and non-pharmacologic comfort measures Outcome: Adequate for Discharge   Problem: Health Behavior/Discharge Planning: Goal: Ability to manage health-related needs will improve Outcome: Adequate for Discharge   Problem: Clinical Measurements: Goal: Ability to maintain clinical measurements within normal limits will improve Outcome: Adequate for Discharge Goal: Will remain free from infection Outcome: Adequate for Discharge Goal: Diagnostic test results will improve Outcome: Adequate for Discharge Goal: Respiratory complications will improve Outcome: Adequate for Discharge Goal: Cardiovascular complication will be avoided Outcome: Adequate for Discharge   Problem: Activity: Goal: Risk for activity intolerance will decrease Outcome: Adequate for Discharge   Problem: Nutrition: Goal: Adequate nutrition will be maintained Outcome: Adequate for Discharge   Problem: Coping: Goal: Level of anxiety will decrease Outcome: Adequate for Discharge   Problem: Elimination: Goal: Will not experience complications related to bowel motility Outcome: Adequate for Discharge Goal: Will not experience complications related to urinary retention Outcome: Adequate for Discharge   Problem: Pain Managment: Goal: General experience of comfort will improve and/or be controlled Outcome: Adequate for Discharge   Problem: Safety: Goal: Ability to remain free from injury will improve Outcome: Adequate for Discharge   Problem: Skin Integrity: Goal: Risk for impaired skin integrity will decrease Outcome: Adequate for Discharge   Problem: Education: Goal: Ability to describe self-care measures that may prevent or decrease complications (Diabetes Survival Skills Education) will improve Outcome: Adequate for Discharge Goal: Individualized Educational  Video(s) Outcome: Adequate for Discharge   Problem: Coping: Goal: Ability to adjust to condition or change in health will improve Outcome: Adequate for Discharge   Problem: Fluid Volume: Goal: Ability to maintain a balanced intake and output will improve Outcome: Adequate for Discharge   Problem: Health Behavior/Discharge Planning: Goal: Ability to identify and utilize available resources and services will improve Outcome: Adequate for Discharge Goal: Ability to manage health-related needs will improve Outcome: Adequate for Discharge   Problem: Metabolic: Goal: Ability to maintain appropriate glucose levels will improve Outcome: Adequate for Discharge   Problem: Nutritional: Goal: Maintenance of adequate nutrition will improve Outcome: Adequate for Discharge Goal: Progress toward achieving an optimal weight will improve Outcome: Adequate for Discharge   Problem: Skin Integrity: Goal: Risk for impaired skin integrity will decrease Outcome: Adequate for Discharge   Problem: Tissue Perfusion: Goal: Adequacy of tissue perfusion will improve Outcome: Adequate for Discharge   Problem: Education: Goal: Ability to describe self-care measures that may prevent or decrease complications (Diabetes Survival Skills Education) will improve Outcome: Adequate for Discharge Goal: Individualized Educational Video(s) Outcome: Adequate for Discharge   Problem: Cardiac: Goal: Ability to maintain an adequate cardiac output will improve Outcome: Adequate for Discharge   Problem: Health Behavior/Discharge Planning: Goal: Ability to identify and utilize available resources and services will improve Outcome: Adequate for Discharge Goal: Ability to manage health-related needs will improve Outcome: Adequate for Discharge   Problem: Fluid Volume: Goal: Ability to achieve a balanced intake and output will improve Outcome: Adequate for Discharge   Problem: Metabolic: Goal: Ability to maintain  appropriate glucose levels will improve Outcome: Adequate for Discharge   Problem: Nutritional: Goal: Maintenance of adequate nutrition will improve Outcome: Adequate for Discharge Goal: Maintenance of adequate weight for body size and type will improve Outcome: Adequate for Discharge   Problem: Respiratory: Goal: Will regain and/or maintain adequate ventilation Outcome: Adequate for Discharge   Problem: Urinary Elimination: Goal: Ability to achieve and maintain adequate renal perfusion and  functioning will improve Outcome: Adequate for Discharge   Problem: Acute Rehab PT Goals(only PT should resolve) Goal: Pt Will Ambulate Outcome: Adequate for Discharge Goal: Pt Will Go Up/Down Stairs Outcome: Adequate for Discharge

## 2024-06-01 ENCOUNTER — Encounter (HOSPITAL_BASED_OUTPATIENT_CLINIC_OR_DEPARTMENT_OTHER): Payer: Self-pay | Admitting: Pulmonary Disease

## 2024-06-01 DIAGNOSIS — G4710 Hypersomnia, unspecified: Secondary | ICD-10-CM

## 2024-09-28 ENCOUNTER — Ambulatory Visit: Admitting: "Endocrinology
